# Patient Record
Sex: Male | Born: 1955 | Hispanic: Yes | Marital: Married | Smoking: Never smoker
Health system: Southern US, Community
[De-identification: ages and names within clinical notes are randomized; demographics above are authoritative.]

## PROBLEM LIST (undated history)

## (undated) DIAGNOSIS — M47816 Spondylosis without myelopathy or radiculopathy, lumbar region: Secondary | ICD-10-CM

## (undated) DIAGNOSIS — G4730 Sleep apnea, unspecified: Secondary | ICD-10-CM

## (undated) DIAGNOSIS — D499 Neoplasm of unspecified behavior of unspecified site: Secondary | ICD-10-CM

## (undated) DIAGNOSIS — Z87442 Personal history of urinary calculi: Secondary | ICD-10-CM

## (undated) DIAGNOSIS — M48061 Spinal stenosis, lumbar region without neurogenic claudication: Secondary | ICD-10-CM

## (undated) DIAGNOSIS — E669 Obesity, unspecified: Secondary | ICD-10-CM

## (undated) DIAGNOSIS — N200 Calculus of kidney: Secondary | ICD-10-CM

## (undated) DIAGNOSIS — E119 Type 2 diabetes mellitus without complications: Secondary | ICD-10-CM

## (undated) DIAGNOSIS — E1169 Type 2 diabetes mellitus with other specified complication: Secondary | ICD-10-CM

## (undated) DIAGNOSIS — I10 Essential (primary) hypertension: Secondary | ICD-10-CM

## (undated) DIAGNOSIS — J45909 Unspecified asthma, uncomplicated: Secondary | ICD-10-CM

## (undated) DIAGNOSIS — M1990 Unspecified osteoarthritis, unspecified site: Secondary | ICD-10-CM

## (undated) DIAGNOSIS — Z6841 Body Mass Index (BMI) 40.0 and over, adult: Secondary | ICD-10-CM

## (undated) HISTORY — DX: Spinal stenosis, lumbar region without neurogenic claudication: M48.061

## (undated) HISTORY — DX: Neoplasm of unspecified behavior of unspecified site: D49.9

## (undated) HISTORY — PX: LITHOTRIPSY: SUR834

## (undated) HISTORY — DX: Spondylosis without myelopathy or radiculopathy, lumbar region: M47.816

## (undated) HISTORY — PX: KNEE ARTHROSCOPY: SUR90

---

## 1998-07-24 ENCOUNTER — Encounter: Payer: Self-pay | Admitting: Emergency Medicine

## 1998-07-24 ENCOUNTER — Emergency Department (HOSPITAL_COMMUNITY): Admission: EM | Admit: 1998-07-24 | Discharge: 1998-07-24 | Payer: Self-pay | Admitting: Emergency Medicine

## 1998-07-25 ENCOUNTER — Emergency Department (HOSPITAL_COMMUNITY): Admission: EM | Admit: 1998-07-25 | Discharge: 1998-07-25 | Payer: Self-pay | Admitting: Emergency Medicine

## 1998-07-26 ENCOUNTER — Ambulatory Visit (HOSPITAL_COMMUNITY): Admission: AD | Admit: 1998-07-26 | Discharge: 1998-07-26 | Payer: Self-pay | Admitting: Urology

## 1998-07-26 ENCOUNTER — Encounter: Payer: Self-pay | Admitting: Urology

## 1998-08-01 ENCOUNTER — Ambulatory Visit (HOSPITAL_COMMUNITY): Admission: RE | Admit: 1998-08-01 | Discharge: 1998-08-01 | Payer: Self-pay | Admitting: Urology

## 1998-08-01 ENCOUNTER — Encounter: Payer: Self-pay | Admitting: Urology

## 1998-08-05 ENCOUNTER — Emergency Department (HOSPITAL_COMMUNITY): Admission: EM | Admit: 1998-08-05 | Discharge: 1998-08-06 | Payer: Self-pay | Admitting: Emergency Medicine

## 1998-08-06 ENCOUNTER — Encounter: Admission: RE | Admit: 1998-08-06 | Discharge: 1998-08-06 | Payer: Self-pay | Admitting: *Deleted

## 2001-03-01 ENCOUNTER — Encounter: Admission: RE | Admit: 2001-03-01 | Discharge: 2001-05-30 | Payer: Self-pay | Admitting: Occupational Medicine

## 2002-01-08 ENCOUNTER — Emergency Department (HOSPITAL_COMMUNITY): Admission: EM | Admit: 2002-01-08 | Discharge: 2002-01-09 | Payer: Self-pay | Admitting: *Deleted

## 2002-03-24 ENCOUNTER — Inpatient Hospital Stay (HOSPITAL_COMMUNITY): Admission: EM | Admit: 2002-03-24 | Discharge: 2002-03-28 | Payer: Self-pay | Admitting: Psychiatry

## 2002-04-16 ENCOUNTER — Inpatient Hospital Stay (HOSPITAL_COMMUNITY): Admission: EM | Admit: 2002-04-16 | Discharge: 2002-04-20 | Payer: Self-pay | Admitting: *Deleted

## 2002-04-21 ENCOUNTER — Other Ambulatory Visit: Admission: RE | Admit: 2002-04-21 | Discharge: 2002-04-25 | Payer: Self-pay | Admitting: *Deleted

## 2003-08-18 DIAGNOSIS — Z87442 Personal history of urinary calculi: Secondary | ICD-10-CM

## 2003-08-18 HISTORY — DX: Personal history of urinary calculi: Z87.442

## 2006-02-03 ENCOUNTER — Emergency Department (HOSPITAL_COMMUNITY): Admission: EM | Admit: 2006-02-03 | Discharge: 2006-02-03 | Payer: Self-pay | Admitting: Family Medicine

## 2006-10-07 ENCOUNTER — Encounter: Admission: RE | Admit: 2006-10-07 | Discharge: 2006-10-07 | Payer: Self-pay | Admitting: Occupational Medicine

## 2007-01-24 ENCOUNTER — Ambulatory Visit (HOSPITAL_BASED_OUTPATIENT_CLINIC_OR_DEPARTMENT_OTHER): Admission: RE | Admit: 2007-01-24 | Discharge: 2007-01-24 | Payer: Self-pay | Admitting: Physician Assistant

## 2007-01-29 ENCOUNTER — Ambulatory Visit: Payer: Self-pay | Admitting: Internal Medicine

## 2010-12-30 NOTE — Procedures (Signed)
NAME:  Jeffery Higgins, Jeffery Higgins               ACCOUNT NO.:  [id number]   MEDICAL RECORD NO.:  [id number]          PATIENT TYPE:  OUT   LOCATION:  SLEEP CENTER                 FACILITY:  [facility]   PHYSICIAN:  Clinton D. Maple Hudson, MD, FCCP, FACPDATE OF BIRTH:  16-Jun-1956   DATE OF STUDY:  01/24/2007                            NOCTURNAL POLYSOMNOGRAM   REFERRING PHYSICIAN:  Lonie Peak, P.A.-C.   INDICATION FOR STUDY:  Hypersomnia with sleep apnea.   EPWORTH SLEEPINESS SCORE:  16/24, BMI 36.7, weight 263 pounds.   MEDICATIONS:  No medications listed.   SLEEP ARCHITECTURE:  Total sleep time 315 minutes and sleep efficiency  82%.  Stage I was 7%, stage II 71%, stages III and IV 3%, REM 19% of  total sleep time.  Sleep latency 22 minutes, REM latency 137 minutes,  awake after sleep onset 48 minutes, arousal index 21.5.  No bedtime  medication was taken.   RESPIRATORY DATA:  Split study protocol.  Apnea/hypopnea index (AHI,  RDI) 19.2 obstructive events per hour indicating moderate obstructive  sleep apnea/hypopnea syndrome before CPAP.  There were 15 obstructive  apneas and 26 hypopneas before CPAP.  Events were not positional.  REM  AHI 6.2.  CPAP was titrated to 10 CWP, AHI zero per hour.  A large  Mirage Quattro mask was used with heated humidifier.   OXYGEN DATA:  Moderately loud snoring with oxygen desaturation to a  nadir of 82% before CPAP.  After CPAP control, saturation held 95% on  room air.   CARDIAC DATA:  Sinus rhythm with occasional PVCs.   MOVEMENT-PARASOMNIA:  No significant movement disturbance. Bathroom x1.   IMPRESSIONS-RECOMMENDATIONS:  1. Moderate obstructive sleep apnea/hypopnea syndrome, AHI 19.2 per      hour with nonpositional events, moderately loud snoring, and oxygen      desaturation to a nadir of 82%.  2. Successful CPAP titration to 10 CWP, AHI zero per hour.  A large      Mirage Quattro full-face mask was used with heated humidifier.      Clinton D. Maple Hudson,  MD, FCCP, FACP  Diplomate, Biomedical engineer of Sleep Medicine  Electronically Signed     CDY/MEDQ  D:  01/30/2007 11:13:31  T:  01/30/2007 19:43:21  Job:  [id number]
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
## 2011-01-02 NOTE — Discharge Summary (Signed)
NAME:  Jeffery Higgins, ARAKAWA NO.:  [id number]   MEDICAL RECORD NO.:  [id number]                   PATIENT TYPE:  IPS   LOCATION:  0301                                 [facility]ACILITY:  [facility]   PHYSICIAN:  Jeanice Lim, MD                DATE OF BIRTH:  1956-04-28   DATE OF ADMISSION:  03/24/2002  DATE OF DISCHARGE:  03/28/2002                                 DISCHARGE SUMMARY   IDENTIFYING DATA:  A 55 year old Latino male with a history of severe  depression, with difficulty concentrating, hopelessness, worthlessness,  panic attacks and suicidal ideation, ruminating about dying, feeling that he  could not take it anymore.   ADMISSION MEDICATIONS:  Trazodone prescribed by Dr. Ilsa Iha.  Not on  antidepressant.   ALLERGIES:  ASPIRIN.   PHYSICAL EXAMINATION:  Essentially within normal limits, neurologically  nonfocal.   ROUTINE ADMISSION LABS:  Essentially within normal limits.   MENTAL STATUS EXAM:  Friendly, cooperative, good eye contact, psychomotor  retardation.  Speech soft and slow, mood depressed and anxious.  Thought  process goal directed.  Thought content positive for fleeting suicidal  ideation, no psychotic symptoms, cognitively intact, judgment and insight  fair.   ADMISSION DIAGNOSES:   AXIS I:  1. Major depressive disorder, recurrent, severe.  2. Panic disorder without agoraphobia.   AXIS II:  None.   AXIS III:  None.   AXIS IV:  Severe, problems with primary support group, separation from his  wife.   AXIS V:  20/60.   HOSPITAL COURSE:  The patient was admitted and ordered routine p.r.n.  medications, underwent further monitoring, was given Ativan p.r.n. anxiety  and titrated on Seroquel.  Family meeting was held.  Daughter's concerns  regarding patient's severity of depression were addressed and the patient  felt that he was less depressed and able to cope with stress outside the  hospital in a healthy manner.   CONDITION ON  DISCHARGE:  Improved.  Mood was more euthymic, affect brighter,  thought process goal directed.  Thought content negative for dangerous  ideation or psychotic symptoms.  The patient reported no panic attacks nor  dangerous ideations and he reported motivation to be compliant with followup  plan.   DISCHARGE MEDICATIONS:  1. Lexapro 10 mg q.a.m.  2. Ativan 0.5 mg q.8 p.r.n. anxiety.  3. Seroquel 100 mg 1-1/2 q.h.s.  4. Ambien 10 mg 1 q.h.s. p.r.n. insomnia.   DISPOSITION:  The patient was to follow up with Dr. Ilsa Iha on August 18 at 1  p.m.   DISCHARGE DIAGNOSES:   AXIS I:  1. Major depressive disorder, recurrent, severe.  2. Panic disorder without agoraphobia.   AXIS II:  None.   AXIS III:  None.   AXIS IV:  Severe, problems with primary support group, separation from his  wife.   AXIS V:  Global assessment of function on discharge was 55.  Jeanice Lim, MD    JEM/MEDQ  D:  04/27/2002  T:  04/28/2002  Job:  [id number]

## 2011-01-02 NOTE — Discharge Summary (Signed)
NAME:  Jeffery Higgins, Jeffery Higgins NO.:  [id number]   MEDICAL RECORD NO.:  [id number]                   PATIENT TYPE:  IPS   LOCATION:  0301                                 [facility]ACILITY[facility]  [facility]   PHYSICIAN:  Jeanice Lim, M.D.              DATE OF BIRTH:  Aug 25, 1955   DATE OF ADMISSION:  03/24/2002  DATE OF DISCHARGE:  03/28/2002                                 DISCHARGE SUMMARY   IDENTIFYING DATA:  This is a 55 year old separated Hispanic male, presenting  with a history of intentional overdose, taking Ambien and possibly some  Seroquel.   ADMISSION MEDICATIONS:  Ambien 10 mg, Lexapro switched to Celexa recently  and then switched again back to Lexapro.  The patient reported being  agitated, was unclear of doses.   ALLERGIES:  ASPIRIN.   PHYSICAL EXAMINATION:  Essentially within normal limits, neurologically  nonfocal.   ROUTINE ADMISSION LABS:  Essentially within normal limits.  CT was negative,  a urine drug screen negative, CBC within normal limits, albumin slightly low  at 3.1.  Salicylates and acetaminophen level were within safe ranges.   MENTAL STATUS EXAM:  Alert, middle-aged male, cooperative, casually dressed,  speech clear and relevant, mood depressed, affect flat, thought process goal  directed, thought content negative for dangerous ideations or psychotic  symptoms.  The patient reported on suicidal or homicidal ideation at the  time of evaluation.  Cognition intact.  Judgment and insight were fair.   ADMISSION DIAGNOSES:   AXIS I:  Major depressive disorder, recurrent, severe.   AXIS II:  None.   AXIS III:  None.   AXIS IV:  Moderate, problems with primary support group.   AXIS V:  30/70.   HOSPITAL COURSE:  The patient was admitted and ordered routine p.r.n.  medications, underwent further monitoring, and was encouraged to participate  in individual, group and milieu therapy.  Was concerned about being the  hospital and not being at  work, feeling that he only had 1 more day to be  able to take off work, reported being desperate at the time of the overdose,  taking Ambien and trazodone and now feels that the overdose was crazy,  regretting the overdose and reporting ability to cope with outside stresses.  The patient was monitored for an adequate time period, reported no recurring  suicidal or homicidal ideation.  The patient also reported motivation to be  willing to follow up with the Intensive Outpatient program and reported a  positive response to clinical intervention regarding crisis control   CONDITION ON DISCHARGE:  Improved.  Mood was less depressed, affect  brighter, thought process goal directed.  Thought content negative for  dangerous ideation or psychotic symptoms.  The patient specifically denied  suicidal ideation and reported willingness to follow up with Intensive  Outpatient Program and being compliant with medications despite mild side  effects that he may have  had in the past.   DISCHARGE MEDICATIONS:  1. Ativan 1 mg t.i.d.  2. Effexor XR 37.5 mg 2 q.a.m. and 1 q.3 p.m.  3. Seroquel 100 mg q.h.s.  4. Ambien 10 mg q.h.s. p.r.n. insomnia.   DISPOSITION:  The patient was to follow up with the Intensive Outpatient  Program on September 5 at 8:30 and then Valente David on September 8 at 10  a.m.   DISCHARGE DIAGNOSES:   AXIS I:  Major depressive disorder, recurrent, severe.   AXIS II:  None.   AXIS III:  None.   AXIS IV:  Moderate, problems with primary support group.   AXIS V:  Global assessment of function on discharge was 55.                                                 Jeanice Lim, M.D.    JEM/MEDQ  D:  06/28/2002  T:  06/29/2002  Job:  [id number]
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
## 2012-01-25 ENCOUNTER — Other Ambulatory Visit: Payer: Self-pay | Admitting: Occupational Medicine

## 2012-01-25 ENCOUNTER — Ambulatory Visit: Payer: Self-pay

## 2012-01-25 DIAGNOSIS — R52 Pain, unspecified: Secondary | ICD-10-CM

## 2012-06-24 ENCOUNTER — Encounter (HOSPITAL_COMMUNITY): Payer: Self-pay | Admitting: Pharmacy Technician

## 2012-06-30 ENCOUNTER — Encounter (HOSPITAL_COMMUNITY)
Admission: RE | Admit: 2012-06-30 | Discharge: 2012-06-30 | Disposition: A | Discharge status: Home / Self Care | Payer: Worker's Compensation | Source: Ambulatory Visit | Attending: Orthopedic Surgery | Admitting: Orthopedic Surgery

## 2012-06-30 ENCOUNTER — Encounter (HOSPITAL_COMMUNITY)
Admission: RE | Admit: 2012-06-30 | Discharge: 2012-06-30 | Disposition: A | Discharge status: Home / Self Care | Payer: Worker's Compensation | Source: Ambulatory Visit | Attending: Anesthesiology | Admitting: Anesthesiology

## 2012-06-30 ENCOUNTER — Encounter (HOSPITAL_COMMUNITY): Payer: Self-pay

## 2012-06-30 HISTORY — DX: Unspecified osteoarthritis, unspecified site: M19.90

## 2012-06-30 HISTORY — DX: Personal history of urinary calculi: Z87.442

## 2012-06-30 HISTORY — DX: Essential (primary) hypertension: I10

## 2012-06-30 HISTORY — DX: Sleep apnea, unspecified: G47.30

## 2012-06-30 HISTORY — DX: Type 2 diabetes mellitus without complications: E11.9

## 2012-06-30 LAB — CBC
HCT: 47.5 % (ref 39.0–52.0)
Hemoglobin: 15.7 g/dL (ref 13.0–17.0)
MCHC: 33.1 g/dL (ref 30.0–36.0)
MCV: 88 fL (ref 78.0–100.0)
RDW: 14.3 % (ref 11.5–15.5)
WBC: 7.4 10*3/uL (ref 4.0–10.5)

## 2012-06-30 LAB — BASIC METABOLIC PANEL
Creatinine, Ser: 0.84 mg/dL (ref 0.50–1.35)
GFR calc non Af Amer: >90 mL/min (ref >90)
Glucose, Bld: 159 mg/dL — ABNORMAL HIGH (ref 70–99)
Potassium: 4.2 mEq/L (ref 3.5–5.1)
Sodium: 140 mEq/L (ref 135–145)

## 2012-06-30 LAB — SURGICAL PCR SCREEN: Staphylococcus aureus: NEGATIVE

## 2012-06-30 LAB — NO BLOOD PRODUCTS

## 2012-06-30 NOTE — Progress Notes (Addendum)
Pt has been dx with sleep apnea, last sleep study 2008, in chart. Pt cannot tolerate and does not use CPAP.   Pt's EKG "cannot rule out anterior infarct". Will leave for anesthesia review. Pt denies ever having prior EKG done, none available for comparison. Pt denied hx of cardiac problems or cardiac testing.

## 2012-06-30 NOTE — Pre-Procedure Instructions (Signed)
20 Ruble Pumphrey  06/30/2012   Your procedure is scheduled on:  Thursday November 21  Report to [facility] Stay [facility] at 5:30 AM.  Call this number if you have problems the morning of surgery: [phone]   Remember:   Do not eat or drink:After Midnight.    Take these medicines the morning of surgery with A SIP OF WATER: Amlodipine (Norvasc)   Do not wear jewelry, make-up or nail polish.  Do not wear lotions, powders, or perfumes. You may wear deodorant.  Do not shave 48 hours prior to surgery. Men may shave face and neck.  Do not bring valuables to the hospital.  Contacts, dentures or bridgework may not be worn into surgery.  Leave suitcase in the car. After surgery it may be brought to your room.  For patients admitted to the hospital, checkout time is 11:00 AM the day of discharge.   Patients discharged the day of surgery will not be allowed to drive home.    Special Instructions: Shower using CHG 2 nights before surgery and the night before surgery.  If you shower the day of surgery use CHG.  Use special wash - you have one bottle of CHG for all showers.  You should use approximately 1/3 of the bottle for each shower.   Please read over the following fact sheets that you were given: Incentive Spirometry, Pain Booklet, Coughing and Deep Breathing and Surgical Site Infection Prevention

## 2012-07-01 NOTE — Consult Note (Signed)
Anesthesia chart review: Patient is a 56 year old male scheduled for right shoulder arthroscopy with subacromial decompression distal clavicle resection for right shoulder impingement by Dr. Rennis Chris on 07/07/12.  History includes nonsmoker, morbid obesity, hypertension, diabetes mellitus type 2, obstructive sleep apnea without CPAP use, kidney stones, arthritis.  Labs noted.  Glucose is 159.  He has refused all blood products.  Chest x-ray on 06/30/2012 showed no active disease.  EKG on 06/30/2012 showed normal sinus rhythm, minimal voltage criteria for LVH, left axis deviation. His left axis deviation was new since 01/09/02, but otherwise not felt significantly changed.  If no acute cardiopulmonary symptoms then anticipate he can proceed as planned.  Shonna Chock, PA-C

## 2012-07-06 MED ORDER — DEXTROSE 5 % IV SOLN
3.0000 g | INTRAVENOUS | Status: DC
  Filled 2012-07-06: qty 3000

## 2012-07-07 ENCOUNTER — Encounter (HOSPITAL_COMMUNITY): Payer: Self-pay | Admitting: Vascular Surgery

## 2012-07-07 ENCOUNTER — Ambulatory Visit (HOSPITAL_COMMUNITY): Payer: Worker's Compensation | Admitting: Vascular Surgery

## 2012-07-07 ENCOUNTER — Encounter (HOSPITAL_COMMUNITY)
Admission: RE | Disposition: A | Discharge status: Home / Self Care | Payer: Self-pay | Source: Ambulatory Visit | Attending: Orthopedic Surgery

## 2012-07-07 ENCOUNTER — Ambulatory Visit (HOSPITAL_COMMUNITY)
Admission: RE | Admit: 2012-07-07 | Discharge: 2012-07-07 | Disposition: A | Discharge status: Home / Self Care | Payer: Worker's Compensation | Source: Ambulatory Visit | Attending: Orthopedic Surgery | Admitting: Orthopedic Surgery

## 2012-07-07 DIAGNOSIS — G4730 Sleep apnea, unspecified: Secondary | ICD-10-CM | POA: Insufficient documentation

## 2012-07-07 DIAGNOSIS — E119 Type 2 diabetes mellitus without complications: Secondary | ICD-10-CM | POA: Insufficient documentation

## 2012-07-07 DIAGNOSIS — Z79899 Other long term (current) drug therapy: Secondary | ICD-10-CM | POA: Insufficient documentation

## 2012-07-07 DIAGNOSIS — M24119 Other articular cartilage disorders, unspecified shoulder: Secondary | ICD-10-CM | POA: Insufficient documentation

## 2012-07-07 DIAGNOSIS — I10 Essential (primary) hypertension: Secondary | ICD-10-CM | POA: Insufficient documentation

## 2012-07-07 DIAGNOSIS — M25819 Other specified joint disorders, unspecified shoulder: Secondary | ICD-10-CM | POA: Insufficient documentation

## 2012-07-07 DIAGNOSIS — Z01818 Encounter for other preprocedural examination: Secondary | ICD-10-CM | POA: Insufficient documentation

## 2012-07-07 DIAGNOSIS — M129 Arthropathy, unspecified: Secondary | ICD-10-CM | POA: Insufficient documentation

## 2012-07-07 DIAGNOSIS — M19019 Primary osteoarthritis, unspecified shoulder: Secondary | ICD-10-CM | POA: Insufficient documentation

## 2012-07-07 DIAGNOSIS — Z87442 Personal history of urinary calculi: Secondary | ICD-10-CM | POA: Insufficient documentation

## 2012-07-07 DIAGNOSIS — Z01812 Encounter for preprocedural laboratory examination: Secondary | ICD-10-CM | POA: Insufficient documentation

## 2012-07-07 HISTORY — PX: SHOULDER ARTHROSCOPY WITH SUBACROMIAL DECOMPRESSION: SHX5684

## 2012-07-07 SURGERY — SHOULDER ARTHROSCOPY WITH SUBACROMIAL DECOMPRESSION
Anesthesia: General | Site: Shoulder | Laterality: Right | Wound class: Clean

## 2012-07-07 MED ORDER — FENTANYL CITRATE 0.05 MG/ML IJ SOLN
INTRAMUSCULAR | Status: DC | PRN
  Administered 2012-07-07 (×5): 50 ug via INTRAVENOUS
  Administered 2012-07-07: 100 ug via INTRAVENOUS
  Administered 2012-07-07: 50 ug via INTRAVENOUS

## 2012-07-07 MED ORDER — HYDROMORPHONE HCL PF 1 MG/ML IJ SOLN
INTRAMUSCULAR | Status: AC
  Filled 2012-07-07: qty 1

## 2012-07-07 MED ORDER — BUPIVACAINE HCL 0.25 % IJ SOLN
INTRAMUSCULAR | Status: DC | PRN
  Administered 2012-07-07: 60 mL

## 2012-07-07 MED ORDER — OXYCODONE HCL 5 MG/5ML PO SOLN: 5.0000 mg | Freq: Once | ORAL | Status: DC | PRN

## 2012-07-07 MED ORDER — OXYCODONE HCL 5 MG PO TABS: 5.0000 mg | ORAL_TABLET | Freq: Once | ORAL | Status: DC | PRN

## 2012-07-07 MED ORDER — GLYCOPYRROLATE 0.2 MG/ML IJ SOLN
INTRAMUSCULAR | Status: DC | PRN
  Administered 2012-07-07: .9 mg via INTRAVENOUS

## 2012-07-07 MED ORDER — SODIUM CHLORIDE 0.9 % IR SOLN
Status: DC | PRN
  Administered 2012-07-07: 6000 mL

## 2012-07-07 MED ORDER — OXYCODONE-ACETAMINOPHEN 5-325 MG PO TABS: 1.0000 | ORAL_TABLET | ORAL | Status: DC | PRN

## 2012-07-07 MED ORDER — METOCLOPRAMIDE HCL 5 MG/ML IJ SOLN
10.0000 mg | Freq: Once | INTRAMUSCULAR | Status: AC | PRN
  Administered 2012-07-07: 10 mg via INTRAVENOUS

## 2012-07-07 MED ORDER — TEMAZEPAM 30 MG PO CAPS: 30.0000 mg | ORAL_CAPSULE | Freq: Every evening | ORAL | Status: DC | PRN

## 2012-07-07 MED ORDER — CHLORHEXIDINE GLUCONATE 4 % EX LIQD: 60.0000 mL | Freq: Once | CUTANEOUS | Status: DC

## 2012-07-07 MED ORDER — METOCLOPRAMIDE HCL 5 MG/ML IJ SOLN
INTRAMUSCULAR | Status: DC | PRN
  Administered 2012-07-07: 10 mg via INTRAVENOUS

## 2012-07-07 MED ORDER — LIDOCAINE HCL (CARDIAC) 20 MG/ML IV SOLN
INTRAVENOUS | Status: DC | PRN
  Administered 2012-07-07: 100 mg via INTRAVENOUS

## 2012-07-07 MED ORDER — ONDANSETRON HCL 4 MG/2ML IJ SOLN
INTRAMUSCULAR | Status: DC | PRN
  Administered 2012-07-07: 4 mg via INTRAVENOUS

## 2012-07-07 MED ORDER — HYDROMORPHONE HCL PF 1 MG/ML IJ SOLN
0.2500 mg | INTRAMUSCULAR | Status: DC | PRN
  Administered 2012-07-07 (×2): 0.5 mg via INTRAVENOUS

## 2012-07-07 MED ORDER — LACTATED RINGERS IV SOLN
INTRAVENOUS | Status: DC | PRN
  Administered 2012-07-07 (×2): via INTRAVENOUS

## 2012-07-07 MED ORDER — NEOSTIGMINE METHYLSULFATE 1 MG/ML IJ SOLN
INTRAMUSCULAR | Status: DC | PRN
  Administered 2012-07-07: 5 mg via INTRAVENOUS

## 2012-07-07 MED ORDER — ROCURONIUM BROMIDE 100 MG/10ML IV SOLN
INTRAVENOUS | Status: DC | PRN
  Administered 2012-07-07: 50 mg via INTRAVENOUS

## 2012-07-07 MED ORDER — DEXAMETHASONE SODIUM PHOSPHATE 4 MG/ML IJ SOLN
INTRAMUSCULAR | Status: DC | PRN
  Administered 2012-07-07: 4 mg via INTRAVENOUS

## 2012-07-07 MED ORDER — MIDAZOLAM HCL 5 MG/5ML IJ SOLN
INTRAMUSCULAR | Status: DC | PRN
  Administered 2012-07-07: 2 mg via INTRAVENOUS

## 2012-07-07 MED ORDER — OXYCODONE-ACETAMINOPHEN 5-325 MG PO TABS
ORAL_TABLET | ORAL | Status: AC
  Filled 2012-07-07: qty 2

## 2012-07-07 MED ORDER — LACTATED RINGERS IV SOLN: INTRAVENOUS | Status: DC

## 2012-07-07 MED ORDER — BUPIVACAINE HCL (PF) 0.25 % IJ SOLN
INTRAMUSCULAR | Status: AC
  Filled 2012-07-07: qty 60

## 2012-07-07 MED ORDER — OXYCODONE-ACETAMINOPHEN 5-325 MG PO TABS
2.0000 | ORAL_TABLET | ORAL | Status: AC | PRN
  Administered 2012-07-07: 2 via ORAL

## 2012-07-07 MED ORDER — METOCLOPRAMIDE HCL 5 MG/ML IJ SOLN
INTRAMUSCULAR | Status: AC
  Administered 2012-07-07: 10 mg via INTRAVENOUS
  Filled 2012-07-07: qty 2

## 2012-07-07 MED ORDER — CYCLOBENZAPRINE HCL 10 MG PO TABS: 10.0000 mg | ORAL_TABLET | Freq: Three times a day (TID) | ORAL | Status: DC | PRN

## 2012-07-07 MED ORDER — PROPOFOL 10 MG/ML IV BOLUS
INTRAVENOUS | Status: DC | PRN
  Administered 2012-07-07: 200 mg via INTRAVENOUS

## 2012-07-07 MED ORDER — LABETALOL HCL 5 MG/ML IV SOLN
INTRAVENOUS | Status: DC | PRN
  Administered 2012-07-07 (×2): 5 mg via INTRAVENOUS

## 2012-07-07 SURGICAL SUPPLY — 61 items
BLADE CUTTER GATOR 3.5 (BLADE) ×2 IMPLANT
BLADE GREAT WHITE 4.2 (BLADE) ×2 IMPLANT
BLADE SURG 11 STRL SS (BLADE) ×2 IMPLANT
BOOTCOVER CLEANROOM LRG (PROTECTIVE WEAR) ×4 IMPLANT
BUR 3.5 LG SPHERICAL (BURR) IMPLANT
BUR OVAL 4.0 (BURR) ×2 IMPLANT
BURR 3.5 LG SPHERICAL (BURR)
CANISTER SUCT LVC 12 LTR MEDI- (MISCELLANEOUS) ×2 IMPLANT
CANNULA ACUFLEX KIT 5X76 (CANNULA) ×2 IMPLANT
CANNULA DRILOCK 5.0X75 (CANNULA) IMPLANT
CLOTH BEACON ORANGE TIMEOUT ST (SAFETY) ×2 IMPLANT
CLSR STERI-STRIP ANTIMIC 1/2X4 (GAUZE/BANDAGES/DRESSINGS) ×2 IMPLANT
CONNECTOR 5 IN 1 STRAIGHT STRL (MISCELLANEOUS) ×2 IMPLANT
DRAPE INCISE 23X17 IOBAN STRL (DRAPES) ×1
DRAPE INCISE IOBAN 23X17 STRL (DRAPES) ×1 IMPLANT
DRAPE INCISE IOBAN 66X45 STRL (DRAPES) ×2 IMPLANT
DRAPE STERI 35X30 U-POUCH (DRAPES) ×2 IMPLANT
DRAPE SURG 17X11 SM STRL (DRAPES) ×2 IMPLANT
DRAPE U-SHAPE 47X51 STRL (DRAPES) ×2 IMPLANT
DRSG PAD ABDOMINAL 8X10 ST (GAUZE/BANDAGES/DRESSINGS) ×4 IMPLANT
DURAPREP 26ML APPLICATOR (WOUND CARE) ×4 IMPLANT
ELECT REM PT RETURN 9FT ADLT (ELECTROSURGICAL)
ELECTRODE REM PT RTRN 9FT ADLT (ELECTROSURGICAL) IMPLANT
GLOVE BIO SURGEON STRL SZ7.5 (GLOVE) ×2 IMPLANT
GLOVE BIO SURGEON STRL SZ8 (GLOVE) ×2 IMPLANT
GLOVE BIO SURGEON STRL SZ8.5 (GLOVE) ×4 IMPLANT
GLOVE EUDERMIC 7 POWDERFREE (GLOVE) ×2 IMPLANT
GLOVE SS BIOGEL STRL SZ 7.5 (GLOVE) ×1 IMPLANT
GLOVE SUPERSENSE BIOGEL SZ 7.5 (GLOVE) ×1
GLOVE SURG SS PI 7.5 STRL IVOR (GLOVE) ×2 IMPLANT
GOWN PREVENTION PLUS XXLARGE (GOWN DISPOSABLE) ×4 IMPLANT
GOWN STRL NON-REIN LRG LVL3 (GOWN DISPOSABLE) ×2 IMPLANT
GOWN STRL REIN XL XLG (GOWN DISPOSABLE) ×4 IMPLANT
KIT BASIN OR (CUSTOM PROCEDURE TRAY) ×2 IMPLANT
KIT ROOM TURNOVER OR (KITS) ×2 IMPLANT
KIT SHOULDER TRACTION (DRAPES) ×2 IMPLANT
MANIFOLD NEPTUNE II (INSTRUMENTS) ×2 IMPLANT
NDL SUT 6 .5 CRC .975X.05 MAYO (NEEDLE) IMPLANT
NEEDLE 22X1 1/2 (OR ONLY) (NEEDLE) ×2 IMPLANT
NEEDLE MAYO TAPER (NEEDLE)
NEEDLE SPNL 18GX3.5 QUINCKE PK (NEEDLE) ×2 IMPLANT
NS IRRIG 1000ML POUR BTL (IV SOLUTION) ×2 IMPLANT
PACK SHOULDER (CUSTOM PROCEDURE TRAY) ×2 IMPLANT
PAD ARMBOARD 7.5X6 YLW CONV (MISCELLANEOUS) ×4 IMPLANT
SET ARTHROSCOPY TUBING (MISCELLANEOUS) ×1
SET ARTHROSCOPY TUBING LN (MISCELLANEOUS) ×1 IMPLANT
SLING ARM FOAM STRAP LRG (SOFTGOODS) IMPLANT
SLING ARM FOAM STRAP MED (SOFTGOODS) IMPLANT
SLING ARM FOAM STRAP XLG (SOFTGOODS) ×2 IMPLANT
SPONGE GAUZE 4X4 12PLY (GAUZE/BANDAGES/DRESSINGS) ×2 IMPLANT
SPONGE LAP 4X18 X RAY DECT (DISPOSABLE) ×2 IMPLANT
STRIP CLOSURE SKIN 1/2X4 (GAUZE/BANDAGES/DRESSINGS) IMPLANT
SUT MNCRL AB 3-0 PS2 18 (SUTURE) ×2 IMPLANT
SUT PDS AB 0 CT 36 (SUTURE) IMPLANT
SUT RETRIEVER GRASP 30 DEG (SUTURE) IMPLANT
SYR 20CC LL (SYRINGE) ×2 IMPLANT
TAPE PAPER 3X10 WHT MICROPORE (GAUZE/BANDAGES/DRESSINGS) ×2 IMPLANT
TOWEL OR 17X24 6PK STRL BLUE (TOWEL DISPOSABLE) ×2 IMPLANT
TOWEL OR 17X26 10 PK STRL BLUE (TOWEL DISPOSABLE) ×2 IMPLANT
WAND SUCTION MAX 4MM 90S (SURGICAL WAND) ×2 IMPLANT
WATER STERILE IRR 1000ML POUR (IV SOLUTION) ×2 IMPLANT

## 2012-07-07 NOTE — Anesthesia Preprocedure Evaluation (Addendum)
Anesthesia Evaluation  Patient identified by MRN, date of birth, ID band Patient awake    Reviewed: Allergy & Precautions, H&P , NPO status , Patient's Chart, lab work & pertinent test results, reviewed documented beta blocker date and time   Airway Mallampati: II TM Distance: >3 FB Neck ROM: full    Dental  (+) Dental Advisory Given   Pulmonary sleep apnea ,  breath sounds clear to auscultation        Cardiovascular hypertension, On Medications negative cardio ROS  Rhythm:regular     Neuro/Psych negative neurological ROS  negative psych ROS   GI/Hepatic negative GI ROS, Neg liver ROS,   Endo/Other  diabetes, Well Controlled, Type 2, Oral Hypoglycemic AgentsMorbid obesity  Renal/GU negative Renal ROS  negative genitourinary   Musculoskeletal   Abdominal   Peds  Hematology negative hematology ROS (+)   Anesthesia Other Findings See surgeon's H&P   Reproductive/Obstetrics negative OB ROS                          Anesthesia Physical Anesthesia Plan  ASA: III  Anesthesia Plan: General   Post-op Pain Management:    Induction: Intravenous  Airway Management Planned: Oral ETT  Additional Equipment:   Intra-op Plan:   Post-operative Plan: Extubation in OR  Informed Consent: I have reviewed the patients History and Physical, chart, labs and discussed the procedure including the risks, benefits and alternatives for the proposed anesthesia with the patient or authorized representative who has indicated his/her understanding and acceptance.   Dental Advisory Given  Plan Discussed with: CRNA, Surgeon and Anesthesiologist  Anesthesia Plan Comments:        Anesthesia Quick Evaluation

## 2012-07-07 NOTE — Op Note (Signed)
07/07/2012  9:30 AM  PATIENT:   Jeffery Higgins  56 y.o. male  PRE-OPERATIVE DIAGNOSIS:  RIGHT SHOULDER IMPINGEMENT, AC joint OA  POST-OPERATIVE DIAGNOSIS:  Same with labral tear  PROCEDURE: RSA, labral debridement, SAD, DCR  SURGEON:  Trip Cavanagh, Vania Rea M.D.  ASSISTANTS: Shuford pac   ANESTHESIA:   GET + ISB  EBL: min  SPECIMEN:  none  Drains: none   PATIENT DISPOSITION:  PACU - hemodynamically stable.    PLAN OF CARE: Discharge to home after PACU  Dictation# [id number]

## 2012-07-07 NOTE — Transfer of Care (Signed)
Immediate Anesthesia Transfer of Care Note  Patient: Jeffery Higgins  Procedure(s) Performed: Procedure(s) (LRB) with comments: SHOULDER ARTHROSCOPY WITH SUBACROMIAL DECOMPRESSION (Right) - with distal clavicle resection  Patient Location: PACU  Anesthesia Type:General  Level of Consciousness: awake, alert , oriented and patient cooperative  Airway & Oxygen Therapy: Patient Spontanous Breathing and Patient connected to nasal cannula oxygen  Post-op Assessment: Report given to PACU RN, Post -op Vital signs reviewed and stable and Patient moving all extremities X 4  Post vital signs: Reviewed and stable  Complications: No apparent anesthesia complications

## 2012-07-07 NOTE — Anesthesia Postprocedure Evaluation (Signed)
Anesthesia Post Note  Patient: Jeffery Higgins  Procedure(s) Performed: Procedure(s) (LRB): SHOULDER ARTHROSCOPY WITH SUBACROMIAL DECOMPRESSION (Right)  Anesthesia type: General  Patient location: PACU  Post pain: Pain level controlled  Post assessment: Patient's Cardiovascular Status Stable  Last Vitals:  Filed Vitals:   07/07/12 1100  BP: 151/85  Pulse: 80  Temp: 36.2 C  Resp: 23    Post vital signs: Reviewed and stable  Level of consciousness: alert  Complications: No apparent anesthesia complications

## 2012-07-07 NOTE — Anesthesia Procedure Notes (Signed)
Procedure Name: Intubation Date/Time: 07/07/2012 7:55 AM Performed by: Rogelia Boga Pre-anesthesia Checklist: Patient identified, Emergency Drugs available, Suction available, Patient being monitored and Timeout performed Patient Re-evaluated:Patient Re-evaluated prior to inductionOxygen Delivery Method: Circle system utilized Preoxygenation: Pre-oxygenation with 100% oxygen Intubation Type: IV induction Ventilation: Mask ventilation without difficulty and Oral airway inserted - appropriate to patient size Laryngoscope Size: Mac and 4 Grade View: Grade II Tube type: Oral Tube size: 7.5 mm Number of attempts: 1 Airway Equipment and Method: Stylet Placement Confirmation: ETT inserted through vocal cords under direct vision,  positive ETCO2 and breath sounds checked- equal and bilateral Secured at: 23 cm Tube secured with: Tape Dental Injury: Teeth and Oropharynx as per pre-operative assessment

## 2012-07-07 NOTE — Preoperative (Signed)
Beta Blockers   Reason not to administer Beta Blockers:Not Applicable 

## 2012-07-07 NOTE — H&P (Signed)
Jeffery Higgins    Chief Complaint: RIGHT SHOULDER IMPINGEMENT  HPI: The patient is a 56 y.o. male with chronic right shoulder impingement  Past Medical History  Diagnosis Date  . Hypertension   . Sleep apnea     not using CPAP - could not tolerate  . Diabetes mellitus without complication   . History of kidney stones 2005  . Arthritis     Past Surgical History  Procedure Date  . Lithotripsy     No family history on file.  Social History:  reports that he has never smoked. He does not have any smokeless tobacco history on file. He reports that he drinks alcohol. He reports that he does not use illicit drugs.  Allergies:  Allergies  Allergen Reactions  . Aspirin Swelling    Medications Prior to Admission  Medication Sig Dispense Refill  . amLODipine (NORVASC) 5 MG tablet Take 5 mg by mouth daily.      [name] ibuprofen (ADVIL,MOTRIN) 200 MG tablet Take 200 mg by mouth every 6 (six) hours as needed. For pain      . losartan (COZAAR) 50 MG tablet Take 50 mg by mouth daily.      . metFORMIN (GLUCOPHAGE) 500 MG tablet Take 500 mg by mouth daily.         Physical Exam: right shoulder with painful ROM as noted at most recent office.  Vitals  Temp:  [97.9 F (36.6 C)] 97.9 F (36.6 C) (11/21 0619) Pulse Rate:  [20] 20  (11/21 0619) Resp:  [16] 16  (11/21 0619) BP: (150)/(109) 150/109 mmHg (11/21 0619) SpO2:  [97 %] 97 % (11/21 0619)  Assessment/Plan  Impression: RIGHT SHOULDER IMPINGEMENT   Plan of Action: Procedure(s): SHOULDER ARTHROSCOPY WITH SUBACROMIAL DECOMPRESSION SHOULDER ARTHROSCOPY WITH DISTAL CLAVICLE RESECTION  Jeffery Higgins M 07/07/2012, 7:38 AM

## 2012-07-08 ENCOUNTER — Encounter (HOSPITAL_COMMUNITY): Payer: Self-pay | Admitting: Orthopedic Surgery

## 2012-07-08 NOTE — Op Note (Signed)
Jeffery Higgins, Jeffery Higgins               ACCOUNT NO.:  [id number]  MEDICAL RECORD NO.:  [id number]  LOCATION:  [facility]                         FACILITY:  [facility]  PHYSICIAN:  Vania Rea. Maesyn Frisinger, M.D.  DATE OF BIRTH:  1956/01/22  DATE OF PROCEDURE:  07/07/2012 DATE OF DISCHARGE:  07/07/2012                              OPERATIVE REPORT   PREOPERATIVE DIAGNOSES: 1. Chronic right shoulder pain with impingement syndrome 2. 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  POSTOPERATIVE DIAGNOSIS: 1. Chronic right shoulder pain with impingement syndrome. 2. 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 3. Right shoulder complex and extensive labral tear.  PROCEDURE: 1. Right shoulder examination under anesthesia. 2. Right shoulder diagnostic arthroscopy. 3. Debridement of complex and extensive labral tear. 4. Arthroscopic subacromial decompression and bursectomy. 5. Arthroscopic distal clavicle resection.  SURGEON:  Vania Rea. Shondra Capps, MD  ASSISTANT:  Lucita Lora. Shuford, PA-C  ANESTHESIA:  General endotracheal as well as an interscalene block.  ESTIMATED BLOOD LOSS:  Minimal.  DRAINS:  None.  HISTORY:  Jeffery Higgins was a 56 year old gentleman who has had chronic right shoulder pain with an impingement syndrome and symptomatic AC joint arthrosis which has been refractory to prolonged attempts at conservative management.  Plain radiographs confirming AC joint arthrosis with an MRI scan showing tendinosis of the rotator cuff but no obvious discrete full-thickness tears.  Due to his ongoing pain and functional limitations, he is brought to the operating room at this time for planned right shoulder arthroscopy as described below.  Preoperatively I counseled Jeffery Higgins on treatment options as well as risks versus benefits thereof.  Possible surgical complications reviewed including potential bleeding, infection, neurovascular injury, persistent pain, loss of  motion, anesthetic complication, possible need for additional surgery.  He understands and accepts and agrees with our planned procedure.  PROCEDURE IN DETAIL:  After undergoing routine preop evaluation, the patient received prophylactic antibiotics and an interscalene block was established in the holding area by the Anesthesia Department.  He was placed supine on the operating table, underwent smooth induction of a general endotracheal anesthesia.  Turned to left lateral decubitus position on a beanbag and appropriately padded and protected.  Of note, the patient's morbid obesity made positioning require extra help within the operating room to maintain a safe environment for the transferring staff.  After positioning on lateral decubitus, he was appropriately padded and protected and the right shoulder girdle region was then sterilely prepped and draped in standard fashion after having been suspended at 70 degrees abduction with 15 pounds traction.  Of note, right shoulder motion was full.  No instability patterns.  Once the shoulder girdle region was sterilely prepped and draped in standard fashion, the time-out was called.  A posterior portal was established in the glenohumeral joint, the anterior portal was established under direct visualization.  The glenohumeral articular surfaces were in good condition.  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  We went ahead and divided this band of tissue and also used a shaver to debride the labral tear back to stable margin. The rotator cuff was carefully inspected and found to have some minimal fraying posteriorly but no  significant defects and no instability patterns.  Biceps anchor was stable.  At this point, the Arthrex wand was then used to obtain hemostasis.  Fluid and instrument removed.  The arm was dropped down to 30 degrees abduction with the arthroscope introduced in the subacromial space of the posterior portal and a direct lateral portal was established in the subacromial space.  Abundant dense bursal tissue and multiple adhesions were encountered and these were all divided and excised with a combination of shaver and Stryker wand.  The wand was then used to remove the periosteum from the undersurface of the anterior half of the acromion and then subacromial decompression was performed with a bur.  Subacromial space was noted to be markedly stenotic and was nicely decompressed.  We then established a portal directly anterior to the distal clavicle and distal clavicle resection was performed with a bur.  Care was taken to confirm visualization of the entire circumference of the distal clavicle to ensure adequate removal of bone.  We then completed the subacromial/subdeltoid bursectomy.  The bursal surface rotator cuff was probed, inspected, and found to be intact with only some very minimal superficial fraying.  We then completed the subacromial/subdeltoid bursectomy and obtained final hemostasis.  Tracey Shuford, PA-C was used as an Geophysicist/field seismologist throughout this case and essential for help with positioning extremity, positioning the patient, management of the arthroscopic equipment, and assistance considering the patient's morbid obesity with BMI greater than 40.  Portals were closed with Monocryl and Steri-Strips.  A dry dressing placed in the right shoulder.  Right arm was placed in a sling and the patient was then awakened, extubated, and taken to recovery room in stable condition.     Vania Rea. Mariaclara Spear, M.D.     KMS/MEDQ  D:  07/07/2012  T:  07/08/2012  Job:  [id number]

## 2012-07-30 ENCOUNTER — Inpatient Hospital Stay (HOSPITAL_COMMUNITY)
Admission: EM | Admit: 2012-07-30 | Discharge: 2012-08-03 | DRG: 494 | Disposition: A | Discharge status: Home / Self Care | Payer: BC Managed Care – PPO | Attending: General Surgery | Admitting: General Surgery

## 2012-07-30 ENCOUNTER — Encounter (HOSPITAL_COMMUNITY): Payer: Self-pay | Admitting: Emergency Medicine

## 2012-07-30 ENCOUNTER — Encounter (HOSPITAL_COMMUNITY): Payer: Self-pay | Admitting: Family Medicine

## 2012-07-30 ENCOUNTER — Emergency Department (HOSPITAL_COMMUNITY)
Admission: EM | Admit: 2012-07-30 | Discharge: 2012-07-30 | Disposition: A | Discharge status: Home / Self Care | Payer: BC Managed Care – PPO | Attending: Emergency Medicine | Admitting: Emergency Medicine

## 2012-07-30 ENCOUNTER — Emergency Department (HOSPITAL_COMMUNITY): Payer: BC Managed Care – PPO

## 2012-07-30 DIAGNOSIS — G4730 Sleep apnea, unspecified: Secondary | ICD-10-CM | POA: Diagnosis present

## 2012-07-30 DIAGNOSIS — K8000 Calculus of gallbladder with acute cholecystitis without obstruction: Principal | ICD-10-CM | POA: Diagnosis present

## 2012-07-30 DIAGNOSIS — E119 Type 2 diabetes mellitus without complications: Secondary | ICD-10-CM

## 2012-07-30 DIAGNOSIS — I10 Essential (primary) hypertension: Secondary | ICD-10-CM | POA: Diagnosis present

## 2012-07-30 DIAGNOSIS — Z6841 Body Mass Index (BMI) 40.0 and over, adult: Secondary | ICD-10-CM

## 2012-07-30 DIAGNOSIS — K8050 Calculus of bile duct without cholangitis or cholecystitis without obstruction: Secondary | ICD-10-CM

## 2012-07-30 DIAGNOSIS — Z8739 Personal history of other diseases of the musculoskeletal system and connective tissue: Secondary | ICD-10-CM | POA: Insufficient documentation

## 2012-07-30 DIAGNOSIS — K8010 Calculus of gallbladder with chronic cholecystitis without obstruction: Secondary | ICD-10-CM | POA: Diagnosis present

## 2012-07-30 DIAGNOSIS — R7402 Elevation of levels of lactic acid dehydrogenase (LDH): Secondary | ICD-10-CM | POA: Insufficient documentation

## 2012-07-30 DIAGNOSIS — R7401 Elevation of levels of liver transaminase levels: Secondary | ICD-10-CM | POA: Insufficient documentation

## 2012-07-30 DIAGNOSIS — E1169 Type 2 diabetes mellitus with other specified complication: Secondary | ICD-10-CM

## 2012-07-30 DIAGNOSIS — G4733 Obstructive sleep apnea (adult) (pediatric): Secondary | ICD-10-CM | POA: Diagnosis present

## 2012-07-30 DIAGNOSIS — Z79899 Other long term (current) drug therapy: Secondary | ICD-10-CM | POA: Insufficient documentation

## 2012-07-30 DIAGNOSIS — K8020 Calculus of gallbladder without cholecystitis without obstruction: Secondary | ICD-10-CM | POA: Insufficient documentation

## 2012-07-30 DIAGNOSIS — Z87442 Personal history of urinary calculi: Secondary | ICD-10-CM | POA: Insufficient documentation

## 2012-07-30 DIAGNOSIS — R748 Abnormal levels of other serum enzymes: Secondary | ICD-10-CM

## 2012-07-30 HISTORY — DX: Type 2 diabetes mellitus with other specified complication: E11.69

## 2012-07-30 HISTORY — DX: Obesity, unspecified: E66.9

## 2012-07-30 HISTORY — DX: Body Mass Index (BMI) 40.0 and over, adult: Z684

## 2012-07-30 LAB — CBC WITH DIFFERENTIAL/PLATELET
Basophils Relative: 1 % (ref 0–1)
Eosinophils Absolute: 0.1 10*3/uL (ref 0.0–0.7)
Hemoglobin: 14.7 g/dL (ref 13.0–17.0)
Hemoglobin: 15.2 g/dL (ref 13.0–17.0)
Lymphocytes Relative: 33 % (ref 12–46)
Lymphs Abs: 2.5 10*3/uL (ref 0.7–4.0)
MCH: 29.9 pg (ref 26.0–34.0)
MCHC: 34 g/dL (ref 30.0–36.0)
Monocytes Relative: 9 % (ref 3–12)
Monocytes Relative: 7 % (ref 3–12)
Neutrophils Relative %: 54 % (ref 43–77)
Neutrophils Relative %: 64 % (ref 43–77)
Platelets: 173 10*3/uL (ref 150–400)
Platelets: ADEQUATE 10*3/uL (ref 150–400)
RBC: 4.96 MIL/uL (ref 4.22–5.81)
RDW: 13.8 % (ref 11.5–15.5)
WBC: 7.5 10*3/uL (ref 4.0–10.5)

## 2012-07-30 LAB — COMPREHENSIVE METABOLIC PANEL
ALT: 169 U/L — ABNORMAL HIGH (ref 0–53)
Albumin: 3.7 g/dL (ref 3.5–5.2)
Alkaline Phosphatase: 66 U/L (ref 39–117)
Alkaline Phosphatase: 74 U/L (ref 39–117)
BUN: 11 mg/dL (ref 6–23)
CO2: 26 mEq/L (ref 19–32)
Chloride: 103 mEq/L (ref 96–112)
Creatinine, Ser: 0.78 mg/dL (ref 0.50–1.35)
GFR calc Af Amer: >90 mL/min (ref >90)
GFR calc non Af Amer: >90 mL/min (ref >90)
Glucose, Bld: 134 mg/dL — ABNORMAL HIGH (ref 70–99)
Potassium: 4.8 mEq/L (ref 3.5–5.1)
Potassium: 4.3 mEq/L (ref 3.5–5.1)
Sodium: 140 mEq/L (ref 135–145)
Total Bilirubin: 0.7 mg/dL (ref 0.3–1.2)
Total Protein: 7.4 g/dL (ref 6.0–8.3)

## 2012-07-30 LAB — LIPASE, BLOOD: Lipase: 28 U/L (ref 11–59)

## 2012-07-30 MED ORDER — SODIUM CHLORIDE 0.9 % IV BOLUS (SEPSIS)
1000.0000 mL | Freq: Once | INTRAVENOUS | Status: AC
  Administered 2012-07-30: 1000 mL via INTRAVENOUS

## 2012-07-30 MED ORDER — SODIUM CHLORIDE 0.9 % IV SOLN
Freq: Once | INTRAVENOUS | Status: AC
  Administered 2012-07-30: 20 mL/h via INTRAVENOUS

## 2012-07-30 MED ORDER — PANTOPRAZOLE SODIUM 40 MG IV SOLR
40.0000 mg | Freq: Every day | INTRAVENOUS | Status: DC
  Administered 2012-07-30 – 2012-08-02 (×4): 40 mg via INTRAVENOUS
  Filled 2012-07-30 (×5): qty 40

## 2012-07-30 MED ORDER — ACETAMINOPHEN 325 MG PO TABS
650.0000 mg | ORAL_TABLET | Freq: Four times a day (QID) | ORAL | Status: DC | PRN
  Administered 2012-07-31 (×2): 650 mg via ORAL
  Filled 2012-07-30 (×2): qty 2

## 2012-07-30 MED ORDER — ONDANSETRON HCL 4 MG/2ML IJ SOLN
4.0000 mg | Freq: Once | INTRAMUSCULAR | Status: AC
  Administered 2012-07-30: 4 mg via INTRAVENOUS
  Filled 2012-07-30: qty 2

## 2012-07-30 MED ORDER — INSULIN ASPART 100 UNIT/ML ~~LOC~~ SOLN
0.0000 [IU] | SUBCUTANEOUS | Status: DC
  Administered 2012-08-02 – 2012-08-03 (×4): 1 [IU] via SUBCUTANEOUS

## 2012-07-30 MED ORDER — POTASSIUM CHLORIDE IN NACL 20-0.9 MEQ/L-% IV SOLN
INTRAVENOUS | Status: DC
  Administered 2012-07-30 – 2012-08-02 (×6): via INTRAVENOUS
  Filled 2012-07-30 (×11): qty 1000

## 2012-07-30 MED ORDER — PIPERACILLIN-TAZOBACTAM 3.375 G IVPB 30 MIN
3.3750 g | Freq: Once | INTRAVENOUS | Status: AC
  Administered 2012-07-30: 3.375 g via INTRAVENOUS
  Filled 2012-07-30: qty 50

## 2012-07-30 MED ORDER — PIPERACILLIN-TAZOBACTAM 3.375 G IVPB
3.3750 g | Freq: Three times a day (TID) | INTRAVENOUS | Status: DC
  Administered 2012-07-31 – 2012-08-02 (×8): 3.375 g via INTRAVENOUS
  Filled 2012-07-30 (×11): qty 50

## 2012-07-30 MED ORDER — HYDROMORPHONE HCL PF 1 MG/ML IJ SOLN
0.5000 mg | Freq: Once | INTRAMUSCULAR | Status: AC
  Administered 2012-07-30: 0.5 mg via INTRAVENOUS

## 2012-07-30 MED ORDER — LOSARTAN POTASSIUM 50 MG PO TABS
50.0000 mg | ORAL_TABLET | Freq: Every day | ORAL | Status: DC
  Administered 2012-07-31 – 2012-08-03 (×4): 50 mg via ORAL
  Filled 2012-07-30 (×4): qty 1

## 2012-07-30 MED ORDER — METFORMIN HCL 500 MG PO TABS
500.0000 mg | ORAL_TABLET | Freq: Every day | ORAL | Status: DC
  Administered 2012-08-01: 500 mg via ORAL
  Filled 2012-07-30 (×5): qty 1

## 2012-07-30 MED ORDER — ACETAMINOPHEN 650 MG RE SUPP: 650.0000 mg | Freq: Four times a day (QID) | RECTAL | Status: DC | PRN

## 2012-07-30 MED ORDER — HYDROMORPHONE HCL PF 1 MG/ML IJ SOLN
1.0000 mg | INTRAMUSCULAR | Status: DC | PRN
  Administered 2012-07-31 (×2): 2 mg via INTRAVENOUS
  Filled 2012-07-30 (×2): qty 2

## 2012-07-30 MED ORDER — ONDANSETRON HCL 4 MG/2ML IJ SOLN
4.0000 mg | Freq: Once | INTRAMUSCULAR | Status: AC
  Administered 2012-07-30: 4 mg via INTRAVENOUS

## 2012-07-30 MED ORDER — ONDANSETRON HCL 4 MG/2ML IJ SOLN
4.0000 mg | Freq: Four times a day (QID) | INTRAMUSCULAR | Status: DC | PRN
  Administered 2012-07-31 – 2012-08-02 (×4): 4 mg via INTRAVENOUS
  Filled 2012-07-30 (×4): qty 2

## 2012-07-30 MED ORDER — ONDANSETRON HCL 4 MG/2ML IJ SOLN
INTRAMUSCULAR | Status: AC
  Filled 2012-07-30: qty 2

## 2012-07-30 MED ORDER — ONDANSETRON 4 MG PO TBDP: 4.0000 mg | ORAL_TABLET | Freq: Three times a day (TID) | ORAL | Status: DC | PRN

## 2012-07-30 MED ORDER — PROMETHAZINE HCL 25 MG/ML IJ SOLN
12.5000 mg | Freq: Three times a day (TID) | INTRAMUSCULAR | Status: DC | PRN
  Administered 2012-07-30 – 2012-07-31 (×2): 12.5 mg via INTRAVENOUS
  Filled 2012-07-30 (×3): qty 1

## 2012-07-30 MED ORDER — AMLODIPINE BESYLATE 5 MG PO TABS
5.0000 mg | ORAL_TABLET | Freq: Every day | ORAL | Status: DC
  Administered 2012-07-31 – 2012-08-03 (×4): 5 mg via ORAL
  Filled 2012-07-30 (×4): qty 1

## 2012-07-30 MED ORDER — HYDROMORPHONE HCL PF 1 MG/ML IJ SOLN
1.0000 mg | Freq: Once | INTRAMUSCULAR | Status: AC
  Administered 2012-07-30: 1 mg via INTRAVENOUS
  Filled 2012-07-30: qty 1

## 2012-07-30 MED ORDER — OXYCODONE-ACETAMINOPHEN 5-325 MG PO TABS: 1.0000 | ORAL_TABLET | ORAL | Status: DC | PRN

## 2012-07-30 MED ORDER — HYDROMORPHONE HCL PF 1 MG/ML IJ SOLN
0.5000 mg | Freq: Once | INTRAMUSCULAR | Status: AC
  Administered 2012-07-30: 0.5 mg via INTRAVENOUS
  Filled 2012-07-30: qty 1

## 2012-07-30 NOTE — ED Notes (Signed)
Back from [place], no changes, calm, NAD, alert.

## 2012-07-30 NOTE — ED Notes (Signed)
Pt ambulated around Pod A and down to Pod B and back to room.  Pt continues to c/o abdominal pain.

## 2012-07-30 NOTE — ED Provider Notes (Signed)
History     CSN: [id number]  Arrival date & time 07/30/12  1531   First MD Initiated Contact with Patient 07/30/12 1623      Chief Complaint  Patient presents with  . Abdominal Pain    (Consider location/radiation/quality/duration/timing/severity/associated sxs/prior treatment) HPI Pt seen last night for RUQ pain found to have elevated transaminases and gall stones, but no findings of cholecystitis and pain was well controlled. He was discharged with pain meds and Gen Surg referral. States woke up this morning with return of pain, moderate to severe aching RUQ pain, associated with nausea, but no vomiting. No fever. He had brief improvement in pain with percocet at home but pain returned shortly thereafter.   Past Medical History  Diagnosis Date  . Hypertension   . Sleep apnea     not using CPAP - could not tolerate  . Diabetes mellitus without complication   . History of kidney stones 2005  . Arthritis     Past Surgical History  Procedure Date  . Lithotripsy   . Shoulder arthroscopy with subacromial decompression 07/07/2012    Procedure: SHOULDER ARTHROSCOPY WITH SUBACROMIAL DECOMPRESSION;  Surgeon: [name], MD;  Location: [facility] OR;  Service: Orthopedics;  Laterality: Right;  with distal clavicle resection    History reviewed. No pertinent family history.  History  Substance Use Topics  . Smoking status: Never Smoker   . Smokeless tobacco: Not on file  . Alcohol Use: Yes     Comment: occasional       Review of Systems All other systems reviewed and are negative except as noted in HPI.   Allergies  Aspirin  Home Medications   Current Outpatient Rx  Name  Route  Sig  Dispense  Refill  . AMLODIPINE BESYLATE 5 MG PO TABS   Oral   Take 5 mg by mouth daily.         [name] LOSARTAN POTASSIUM 50 MG PO TABS   Oral   Take 50 mg by mouth daily.         [name] METFORMIN HCL 500 MG PO TABS   Oral   Take 500 mg by mouth daily.         . OXYCODONE-ACETAMINOPHEN  5-325 MG PO TABS   Oral   Take 1 tablet by mouth every 4 (four) hours as needed for pain.   20 tablet   0   . ONDANSETRON 4 MG PO TBDP   Oral   Take 1 tablet (4 mg total) by mouth every 8 (eight) hours as needed for nausea.   10 tablet   0     BP 147/78  Pulse 70  Temp 97.7 F (36.5 C) (Oral)  Resp 18  SpO2 95%  Physical Exam  Nursing note and vitals reviewed. Constitutional: He is oriented to person, place, and time. He appears well-developed and well-nourished.  HENT:  Head: Normocephalic and atraumatic.  Eyes: EOM are normal. Pupils are equal, round, and reactive to light.  Neck: Normal range of motion. Neck supple.  Cardiovascular: Normal rate, normal heart sounds and intact distal pulses.   Pulmonary/Chest: Effort normal and breath sounds normal.  Abdominal: Bowel sounds are normal. He exhibits no distension. There is tenderness (mild RUQ tenderness, neg Murphy's). There is no rebound.  Musculoskeletal: Normal range of motion. He exhibits no edema and no tenderness.  Neurological: He is alert and oriented to person, place, and time. He has normal strength. No cranial nerve deficit or sensory deficit.  Skin: Skin is warm and dry. No rash noted.  Psychiatric: He has a normal mood and affect.    ED Course  Procedures (including critical care time)  Labs Reviewed  COMPREHENSIVE METABOLIC PANEL - Abnormal; Notable for the following:    Glucose, Bld 132 (*)     AST 390 (*)     ALT 484 (*)     Total Bilirubin 2.7 (*)     All other components within normal limits  GLUCOSE, CAPILLARY - Abnormal; Notable for the following:    Glucose-Capillary 118 (*)     All other components within normal limits  CBC WITH DIFFERENTIAL  LIPASE, BLOOD  HEMOGLOBIN A1C  COMPREHENSIVE METABOLIC PANEL  CBC   US Abdomen Complete  07/30/2012  *RADIOLOGY REPORT*  Clinical Data:  Abdominal pain, nausea and vomiting.  COMPLETE ABDOMINAL ULTRASOUND  Comparison:  None.  Findings: Examination  is technically limited due to body habitus and overlying bowel gas.  Gallbladder:  Several small stones are demonstrated in the gallbladder.  No significant gallbladder wall thickening. Suggestion of mild sludge.  There are echogenic foci in the nondependent portion of the gallbladder suggesting small polyps or adenomyomatosis.Murphy's sign is negative.  Common bile duct:  Normal caliber with measured diameter of 5.4 mm.  Liver:  Diffusely increased hepatic echotexture suggesting fatty infiltration.  No focal lesions appreciated.  IVC:  Appears normal.  Pancreas:  Pancreas is not visualized due to overlying bowel gas.  Spleen:  Spleen length measures 10.8 cm.  Normal parenchymal echotexture.  Right Kidney:  Right kidney measures 12.4 cm length.  No hydronephrosis.  Left Kidney:  Left kidney measures 14 cm length.  No hydronephrosis.  Abdominal aorta:  No aneurysm identified.  IMPRESSION: Cholelithiasis without associated inflammatory signs.  Non dependent echogenic foci on the gallbladder wall suggest polyps or adenomyomatosis.  Diffuse fatty infiltration of the liver.   Original Report Authenticated By: Burman Nieves, M.D.      1. Biliary colic   2. Abnormal transaminases       MDM  Worsening transaminases and hyperbilirubinemia. Discussed with Dr. Lindie Spruce who will admit for further eval.         Leonette Most B. Bernette Mayers, MD 07/30/12 2251

## 2012-07-30 NOTE — ED Notes (Signed)
EDP into room 

## 2012-07-30 NOTE — ED Provider Notes (Signed)
History     CSN: [id number]  Arrival date & time 07/30/12  0133   First MD Initiated Contact with Patient 07/30/12 0247      Chief Complaint  Patient presents with  . Abdominal Pain    (Consider location/radiation/quality/duration/timing/severity/associated sxs/prior treatment) HPI Comments: 56 year old male with a history of hypertension, history of kidney stones who presents with complaint of right upper quadrant pain. This was acute in onset at 10:30 this evening, it started several hours after eating chicken soup however he had family members eating the same food which did not get sick. The pain has been persistent for the last 4 hours, nothing makes it better or worse, it is associated with nausea and vomiting. He had one loose stool but no overt diarrhea or blood in the stools. He denies any back pain, hematuria, fevers or chills. On arrival the patient was given medication for nausea and pain and at this time his symptoms have significantly improved. He does not have any history of abdominal surgery or gallstones.  Patient is a 56 y.o. male presenting with abdominal pain. The history is provided by the patient and a relative.  Abdominal Pain The primary symptoms of the illness include abdominal pain.    Past Medical History  Diagnosis Date  . Hypertension   . Sleep apnea     not using CPAP - could not tolerate  . Diabetes mellitus without complication   . History of kidney stones 2005  . Arthritis     Past Surgical History  Procedure Date  . Lithotripsy   . Shoulder arthroscopy with subacromial decompression 07/07/2012    Procedure: SHOULDER ARTHROSCOPY WITH SUBACROMIAL DECOMPRESSION;  Surgeon: [name], MD;  Location: [facility] OR;  Service: Orthopedics;  Laterality: Right;  with distal clavicle resection    No family history on file.  History  Substance Use Topics  . Smoking status: Never Smoker   . Smokeless tobacco: Not on file  . Alcohol Use: Yes     Comment:  occasional       Review of Systems  Gastrointestinal: Positive for abdominal pain.  All other systems reviewed and are negative.    Allergies  Aspirin  Home Medications   Current Outpatient Rx  Name  Route  Sig  Dispense  Refill  . AMLODIPINE BESYLATE 5 MG PO TABS   Oral   Take 5 mg by mouth daily.         [name] LOSARTAN POTASSIUM 50 MG PO TABS   Oral   Take 50 mg by mouth daily.         [name] METFORMIN HCL 500 MG PO TABS   Oral   Take 500 mg by mouth daily.         [name] ONDANSETRON 4 MG PO TBDP   Oral   Take 1 tablet (4 mg total) by mouth every 8 (eight) hours as needed for nausea.   10 tablet   0   . OXYCODONE-ACETAMINOPHEN 5-325 MG PO TABS   Oral   Take 1 tablet by mouth every 4 (four) hours as needed for pain.   20 tablet   0     BP 178/105  Pulse 76  Temp 97.4 F (36.3 C) (Oral)  Resp 16  SpO2 95%  Physical Exam  Nursing note and vitals reviewed. Constitutional: He appears well-developed and well-nourished. No distress.  HENT:  Head: Normocephalic and atraumatic.  Mouth/Throat: Oropharynx is clear and moist. No oropharyngeal exudate.  Eyes: Conjunctivae normal  and EOM are normal. Pupils are equal, round, and reactive to light. Right eye exhibits no discharge. Left eye exhibits no discharge. No scleral icterus.  Neck: Normal range of motion. Neck supple. No JVD present. No thyromegaly present.  Cardiovascular: Normal rate, regular rhythm, normal heart sounds and intact distal pulses.  Exam reveals no gallop and no friction rub.   No murmur heard. Pulmonary/Chest: Effort normal and breath sounds normal. No respiratory distress. He has no wheezes. He has no rales.  Abdominal: Soft. Bowel sounds are normal. He exhibits no distension and no mass. There is no tenderness.       Obese rotund abdomen which is soft and nontender on my exam. This was after pain medications were given  Musculoskeletal: Normal range of motion. He exhibits no edema and no tenderness.   Lymphadenopathy:    He has no cervical adenopathy.  Neurological: He is alert. Coordination normal.  Skin: Skin is warm and dry. No rash noted. No erythema.  Psychiatric: He has a normal mood and affect. His behavior is normal.    ED Course  Procedures (including critical care time)  Labs Reviewed  COMPREHENSIVE METABOLIC PANEL - Abnormal; Notable for the following:    Glucose, Bld 134 (*)     AST 222 (*)     ALT 169 (*)     All other components within normal limits  CBC WITH DIFFERENTIAL  LIPASE, BLOOD  URINALYSIS, MICROSCOPIC ONLY   US Abdomen Complete  07/30/2012  *RADIOLOGY REPORT*  Clinical Data:  Abdominal pain, nausea and vomiting.  COMPLETE ABDOMINAL ULTRASOUND  Comparison:  None.  Findings: Examination is technically limited due to body habitus and overlying bowel gas.  Gallbladder:  Several small stones are demonstrated in the gallbladder.  No significant gallbladder wall thickening. Suggestion of mild sludge.  There are echogenic foci in the nondependent portion of the gallbladder suggesting small polyps or adenomyomatosis.Murphy's sign is negative.  Common bile duct:  Normal caliber with measured diameter of 5.4 mm.  Liver:  Diffusely increased hepatic echotexture suggesting fatty infiltration.  No focal lesions appreciated.  IVC:  Appears normal.  Pancreas:  Pancreas is not visualized due to overlying bowel gas.  Spleen:  Spleen length measures 10.8 cm.  Normal parenchymal echotexture.  Right Kidney:  Right kidney measures 12.4 cm length.  No hydronephrosis.  Left Kidney:  Left kidney measures 14 cm length.  No hydronephrosis.  Abdominal aorta:  No aneurysm identified.  IMPRESSION: Cholelithiasis without associated inflammatory signs.  Non dependent echogenic foci on the gallbladder wall suggest polyps or adenomyomatosis.  Diffuse fatty infiltration of the liver.   Original Report Authenticated By: Burman Nieves, M.D.      1. Biliary colic   2. Transaminitis       MDM   On my exam the patient does not have a Murphy sign, he has no other abdominal tenderness. His lab work however shows elevated liver function tests but no leukocytosis, no elevated lipase. He will receive an ultrasound of his gallbladder to rule out acute cholecystitis. Intravenous Zofran and Dilaudid have been given with significant improvement in symptoms.   On repeat exam the patient has no abdominal tenderness. I have discussed with the patient his transaminitis as well as the ultrasound results showing gallstones but no cholecystitis. I have also discussed his findings and exam with the general surgeon, Dr. Dwain Sarna. He has agreed to see the patient in the office this week and agrees that as long as he is pain-free and  nausea free which he is at this time that he can be discharged to followup as an outpatient. Patient has been explained his results and indications for return and has expressed his understanding.  On repeat exam before discharge the patient is a soft nontender abdomen.   Vida Roller, MD 07/30/12 [phone]

## 2012-07-30 NOTE — Progress Notes (Signed)
ANTIBIOTIC CONSULT NOTE - INITIAL  Pharmacy Consult for Zosyn Indication: Cholecystitis  Allergies  Allergen Reactions  . Aspirin Swelling    Patient Measurements:     Vital Signs: Temp: 97.7 F (36.5 C) (12/14 1536) Temp src: Oral (12/14 1536) BP: 147/78 mmHg (12/14 1536) Pulse Rate: 70  (12/14 1536) Intake/Output from previous day:   Intake/Output from this shift:    Labs:  Basename 07/30/12 1553 07/30/12 0140  WBC 5.3 7.5  HGB 15.2 14.7  PLT PLATELET CLUMPS NOTED ON SMEAR, COUNT APPEARS ADEQUATE 173  LABCREA -- --  CREATININE 0.78 0.78   The CrCl is unknown because both a height and weight (above a minimum accepted value) are required for this calculation. No results found for this basename: VANCOTROUGH:2,VANCOPEAK:2,VANCORANDOM:2,GENTTROUGH:2,GENTPEAK:2,GENTRANDOM:2,TOBRATROUGH:2,TOBRAPEAK:2,TOBRARND:2,AMIKACINPEAK:2,AMIKACINTROU:2,AMIKACIN:2, in the last 72 hours   Microbiology: No results found for this or any previous visit (from the past 720 hour(s)).  Medical History: Past Medical History  Diagnosis Date  . Hypertension   . Sleep apnea     not using CPAP - could not tolerate  . Diabetes mellitus without complication   . History of kidney stones 2005  . Arthritis     Medications:  Amlodipine, losartan, metformin,zofran, percocet  Assessment: Mr.Yaworski was admitted with abdominal pain and was found to have cholelithiasis. Noted LFTs are elevated on admit, with Tbil is elevated. WBC is nml, Scr is nml at baseline.  Plan:  - Zosyn 3.375gm IV over 30 minutes now, then Zosyn 3.375gm IV Q8h, each dose infused over 4 hours starting at 0300 on 12/15. - Will monitor cx/spec/sens, renal fn and clinical status daily.  Thanks, Ahnesty Finfrock K. Allena Katz, PharmD, BCPS.  Clinical Pharmacist Pager [phone]. 07/30/2012 7:23 PM

## 2012-07-30 NOTE — ED Notes (Signed)
Preparing to go to [place]

## 2012-07-30 NOTE — H&P (Addendum)
Jeffery Higgins is an 56 y.o. male.   Chief Complaint: Abdominal pain with nausea. HPI: Patient was seen in ED yesterday evening with same symptoms and known gallstones.  Actually he was diagnoses with gallstones about 20 years ago but surgery was not recommended.  He had not had any severe attacks until yesterday when the pain was 10/10, but it improved to the point where he could go home with oral pain medications.  His LFTs were abnormal yesterday and are worse today.  He was awakened from sleep with his RUQ abdominal pain and came to the ED for evaluation.  Past Medical History  Diagnosis Date  . Hypertension   . Sleep apnea     not using CPAP - could not tolerate  . Diabetes mellitus without complication   . History of kidney stones 2005  . Arthritis     Past Surgical History  Procedure Date  . Lithotripsy   . Shoulder arthroscopy with subacromial decompression 07/07/2012    Procedure: SHOULDER ARTHROSCOPY WITH SUBACROMIAL DECOMPRESSION;  Surgeon: [name], MD;  Location: [facility] OR;  Service: Orthopedics;  Laterality: Right;  with distal clavicle resection    History reviewed. No pertinent family history. Social History:  reports that he has never smoked. He does not have any smokeless tobacco history on file. He reports that he drinks alcohol. He reports that he does not use illicit drugs.  Allergies:  Allergies  Allergen Reactions  . Aspirin Swelling     (Not in a hospital admission)  Results for orders placed during the hospital encounter of 07/30/12 (from the past 48 hour(s))  CBC WITH DIFFERENTIAL     Status: Normal   Collection Time   07/30/12  3:53 PM      Component Value Range Comment   WBC 5.3  4.0 - 10.5 K/uL    RBC 5.08  4.22 - 5.81 MIL/uL    Hemoglobin 15.2  13.0 - 17.0 g/dL    HCT 16.1  09.6 - 04.5 %    MCV 88.0  78.0 - 100.0 fL    MCH 29.9  26.0 - 34.0 pg    MCHC 34.0  30.0 - 36.0 g/dL    RDW 40.9  81.1 - 91.4 %    Platelets    150 - 400 K/uL    Value: PLATELET CLUMPS NOTED ON SMEAR, COUNT APPEARS ADEQUATE   Neutrophils Relative 64  43 - 77 %    Neutro Abs 3.4  1.7 - 7.7 K/uL    Lymphocytes Relative 26  12 - 46 %    Lymphs Abs 1.4  0.7 - 4.0 K/uL    Monocytes Relative 7  3 - 12 %    Monocytes Absolute 0.4  0.1 - 1.0 K/uL    Eosinophils Relative 3  0 - 5 %    Eosinophils Absolute 0.1  0.0 - 0.7 K/uL    Basophils Relative 1  0 - 1 %    Basophils Absolute 0.0  0.0 - 0.1 K/uL   COMPREHENSIVE METABOLIC PANEL     Status: Abnormal   Collection Time   07/30/12  3:53 PM      Component Value Range Comment   Sodium 138  135 - 145 mEq/L    Potassium 4.3  3.5 - 5.1 mEq/L    Chloride 103  96 - 112 mEq/L    CO2 26  19 - 32 mEq/L    Glucose, Bld 132 (*) 70 - 99 mg/dL  BUN 11  6 - 23 mg/dL    Creatinine, Ser 1.61  0.50 - 1.35 mg/dL    Calcium 9.1  8.4 - 09.6 mg/dL    Total Protein 7.4  6.0 - 8.3 g/dL    Albumin 3.7  3.5 - 5.2 g/dL    AST 045 (*) 0 - 37 U/L    ALT 484 (*) 0 - 53 U/L    Alkaline Phosphatase 74  39 - 117 U/L    Total Bilirubin 2.7 (*) 0.3 - 1.2 mg/dL    GFR calc non Af Amer >90  >90 mL/min    GFR calc Af Amer >90  >90 mL/min   LIPASE, BLOOD     Status: Normal   Collection Time   07/30/12  3:53 PM      Component Value Range Comment   Lipase 28  11 - 59 U/L    US Abdomen Complete  07/30/2012  *RADIOLOGY REPORT*  Clinical Data:  Abdominal pain, nausea and vomiting.  COMPLETE ABDOMINAL ULTRASOUND  Comparison:  None.  Findings: Examination is technically limited due to body habitus and overlying bowel gas.  Gallbladder:  Several small stones are demonstrated in the gallbladder.  No significant gallbladder wall thickening. Suggestion of mild sludge.  There are echogenic foci in the nondependent portion of the gallbladder suggesting small polyps or adenomyomatosis.Murphy's sign is negative.  Common bile duct:  Normal caliber with measured diameter of 5.4 mm.  Liver:  Diffusely increased hepatic echotexture suggesting fatty  infiltration.  No focal lesions appreciated.  IVC:  Appears normal.  Pancreas:  Pancreas is not visualized due to overlying bowel gas.  Spleen:  Spleen length measures 10.8 cm.  Normal parenchymal echotexture.  Right Kidney:  Right kidney measures 12.4 cm length.  No hydronephrosis.  Left Kidney:  Left kidney measures 14 cm length.  No hydronephrosis.  Abdominal aorta:  No aneurysm identified.  IMPRESSION: Cholelithiasis without associated inflammatory signs.  Non dependent echogenic foci on the gallbladder wall suggest polyps or adenomyomatosis.  Diffuse fatty infiltration of the liver.   Original Report Authenticated By: Burman Nieves, M.D.     Review of Systems  Constitutional: Negative.  Negative for fever and chills.  HENT: Negative.   Gastrointestinal: Positive for nausea, vomiting and abdominal pain. Negative for diarrhea.  Genitourinary: Negative.   Musculoskeletal: Negative.   Skin: Negative.   Neurological: Negative.   Endo/Heme/Allergies: Negative.   Psychiatric/Behavioral: Negative.     Blood pressure 147/78, pulse 70, temperature 97.7 F (36.5 C), temperature source Oral, resp. rate 18, SpO2 95.00%. Physical Exam  Constitutional: He is oriented to person, place, and time. He appears well-developed and well-nourished.  HENT:  Head: Normocephalic and atraumatic.  Eyes: Conjunctivae normal and EOM are normal. Pupils are equal, round, and reactive to light. No scleral icterus.  Neck: Normal range of motion. Neck supple.  Cardiovascular: Normal rate, regular rhythm and normal heart sounds.   Respiratory: Effort normal and breath sounds normal.  GI: Soft. Bowel sounds are normal. He exhibits no distension and no mass. There is tenderness in the right upper quadrant and epigastric area. There is no rebound and no guarding.  Genitourinary: Prostate normal and penis normal.  Musculoskeletal: Normal range of motion.  Neurological: He is alert and oriented to person, place, and time.  He has normal reflexes.  Skin: Skin is warm and dry.  Psychiatric: He has a normal mood and affect. His behavior is normal. Judgment and thought content normal.  Assessment/Plan RUQ pain with gallstones, abnormal LFTs, elevated Tbili I have spoken with GI who will see the patient in consultation. Gallstones with possible CBD stones.  Either the patient will get ERCP tomorrow or Lap Chole.  I have spoken with GI who has not indicated what they would like to do yet.  Will repeat LFTs and use that to decide course for tomorrow.. Will place the patient on IV antibiotics.  Cherylynn Ridges 07/30/2012, 7:16 PM  Addendum:  The patient is a Jehovah's witness as does not accept blood products.  Marta Lamas. Gae Bon, MD, FACS [phone] [facility]

## 2012-07-30 NOTE — ED Notes (Signed)
C/o R sided abd pain with nausea, vomiting, and diarrhea since 10:30pm.  Denies urinary complaint.

## 2012-07-30 NOTE — ED Notes (Signed)
C/o RUQ pain, sudden onset after eating chicken noodle soup at 2230. Also nv. Tried pepto bismol and vomited. Denies diarrhea but mentions "stool soft". Family at [facility]. Denies obvious sob, blood or fever. No h/o same. "felt fine prior to eating". Labs drawn, CBC resluted and reviewed. Pending CMET & lipase results. Alert, NAD, calm, interactive, skin W&moist.

## 2012-07-30 NOTE — ED Notes (Signed)
Per pt was just here this am for gallbladder issues and the pain is still there after pain meds.

## 2012-07-31 ENCOUNTER — Observation Stay (HOSPITAL_COMMUNITY): Payer: BC Managed Care – PPO

## 2012-07-31 LAB — COMPREHENSIVE METABOLIC PANEL
AST: 229 U/L — ABNORMAL HIGH (ref 0–37)
AST: 135 U/L — ABNORMAL HIGH (ref 0–37)
Albumin: 3.3 g/dL — ABNORMAL LOW (ref 3.5–5.2)
Albumin: 3.5 g/dL (ref 3.5–5.2)
Alkaline Phosphatase: 68 U/L (ref 39–117)
BUN: 10 mg/dL (ref 6–23)
Calcium: 8.8 mg/dL (ref 8.4–10.5)
Chloride: 103 mEq/L (ref 96–112)
Creatinine, Ser: 0.87 mg/dL (ref 0.50–1.35)
Potassium: 3.9 mEq/L (ref 3.5–5.1)
Total Bilirubin: 2.6 mg/dL — ABNORMAL HIGH (ref 0.3–1.2)
Total Bilirubin: 2.4 mg/dL — ABNORMAL HIGH (ref 0.3–1.2)
Total Protein: 6.7 g/dL (ref 6.0–8.3)

## 2012-07-31 LAB — CBC
HCT: 41.8 % (ref 39.0–52.0)
Hemoglobin: 13.8 g/dL (ref 13.0–17.0)
MCV: 87.1 fL (ref 78.0–100.0)
Platelets: 163 10*3/uL (ref 150–400)
Platelets: 163 10*3/uL (ref 150–400)
RBC: 4.8 MIL/uL (ref 4.22–5.81)
RBC: 4.86 MIL/uL (ref 4.22–5.81)
WBC: 5.4 10*3/uL (ref 4.0–10.5)
WBC: 6.5 10*3/uL (ref 4.0–10.5)

## 2012-07-31 LAB — HEMOGLOBIN A1C
Hgb A1c MFr Bld: 6.5 % — ABNORMAL HIGH (ref <5.7)
Mean Plasma Glucose: 140 mg/dL — ABNORMAL HIGH (ref <117)

## 2012-07-31 LAB — GLUCOSE, CAPILLARY
Glucose-Capillary: 106 mg/dL — ABNORMAL HIGH (ref 70–99)
Glucose-Capillary: 105 mg/dL — ABNORMAL HIGH (ref 70–99)
Glucose-Capillary: 92 mg/dL (ref 70–99)

## 2012-07-31 MED ORDER — DIPHENHYDRAMINE HCL 50 MG/ML IJ SOLN
12.5000 mg | Freq: Three times a day (TID) | INTRAMUSCULAR | Status: DC | PRN
  Administered 2012-07-31: 12.5 mg via INTRAVENOUS
  Filled 2012-07-31: qty 1

## 2012-07-31 MED ORDER — MORPHINE SULFATE 2 MG/ML IJ SOLN
2.0000 mg | INTRAMUSCULAR | Status: DC | PRN
  Administered 2012-07-31 – 2012-08-02 (×4): 2 mg via INTRAVENOUS
  Filled 2012-07-31 (×4): qty 1

## 2012-07-31 NOTE — Progress Notes (Signed)
Subjective:  Patient is now pain-free, without nausea, and hungry.  LFTs still elevated, Total bilirubin 2.6, AST 229, ALT 405. WBC 5400. Lipase not repeated. Objective: Vital signs in last 24 hours: Temp:  [97.7 F (36.5 C)-98.4 F (36.9 C)] 98.4 F (36.9 C) (12/15 0527) Pulse Rate:  [61-77] 72  (12/15 0527) Resp:  [18-20] 18  (12/15 0527) BP: (129-164)/(68-86) 136/76 mmHg (12/15 0527) SpO2:  [94 %-98 %] 94 % (12/15 0527) Weight:  [295 lb 1.6 oz (133.856 kg)] 295 lb 1.6 oz (133.856 kg) (12/15 0527)    Intake/Output from previous day:   Intake/Output this shift:    General appearance: alert. No distress. Family in room. . Mental status normal GI: abdomen obese, soft, nontender, no mass. No tenderness to percussion right costal margin. No scars. No hernias.  Lab Results:   Basename 07/31/12 0710 07/30/12 1553  WBC 5.4 5.3  HGB 13.8 15.2  HCT 41.8 44.7  PLT 163 PLATELET CLUMPS NOTED ON SMEAR, COUNT APPEARS ADEQUATE   BMET  Basename 07/31/12 0710 07/30/12 1553  NA 140 138  K 4.0 4.3  CL 105 103  CO2 25 26  GLUCOSE 108* 132*  BUN 10 11  CREATININE 0.87 0.78  CALCIUM 8.8 9.1   PT/INR No results found for this basename: LABPROT:2,INR:2 in the last 72 hours ABG No results found for this basename: PHART:2,PCO2:2,PO2:2,HCO3:2 in the last 72 hours  Studies/Results: US Abdomen Complete  07/30/2012  *RADIOLOGY REPORT*  Clinical Data:  Abdominal pain, nausea and vomiting.  COMPLETE ABDOMINAL ULTRASOUND  Comparison:  None.  Findings: Examination is technically limited due to body habitus and overlying bowel gas.  Gallbladder:  Several small stones are demonstrated in the gallbladder.  No significant gallbladder wall thickening. Suggestion of mild sludge.  There are echogenic foci in the nondependent portion of the gallbladder suggesting small polyps or adenomyomatosis.Murphy's sign is negative.  Common bile duct:  Normal caliber with measured diameter of 5.4 mm.  Liver:   Diffusely increased hepatic echotexture suggesting fatty infiltration.  No focal lesions appreciated.  IVC:  Appears normal.  Pancreas:  Pancreas is not visualized due to overlying bowel gas.  Spleen:  Spleen length measures 10.8 cm.  Normal parenchymal echotexture.  Right Kidney:  Right kidney measures 12.4 cm length.  No hydronephrosis.  Left Kidney:  Left kidney measures 14 cm length.  No hydronephrosis.  Abdominal aorta:  No aneurysm identified.  IMPRESSION: Cholelithiasis without associated inflammatory signs.  Non dependent echogenic foci on the gallbladder wall suggest polyps or adenomyomatosis.  Diffuse fatty infiltration of the liver.   Original Report Authenticated By: Burman Nieves, M.D.     Anti-infectives: Anti-infectives     Start     Dose/Rate Route Frequency Ordered Stop   07/31/12 0300  piperacillin-tazobactam (ZOSYN) IVPB 3.375 g       3.375 g 12.5 mL/hr over 240 Minutes Intravenous Every 8 hours 07/30/12 1916     07/30/12 1930  piperacillin-tazobactam (ZOSYN) IVPB 3.375 g       3.375 g 100 mL/hr over 30 Minutes Intravenous  Once 07/30/12 1916 07/30/12 2103          Assessment/Plan:   Cholelithiasis. Elevated LFTs, persistent Given the absence of pain, fever, or leukocytosis, the persistently elevated liver function tests may well represent CBD stones, Acute cholecystitis is possible, but seems less likely. We will hold off on cholecystectomy now, and await GI consult regarding ERCP. He is advised that he will need cholecystectomy during this admission to resolve  his problems. Continue n.p.o. Continue IV antibiotics. Labs tomorrow   LOS: 1 day    Saket Hellstrom M. Derrell Lolling, M.D., [facility] General and Minimally invasive Surgery Breast and Colorectal Surgery Office:   [phone] Pager:   [phone]  07/31/2012

## 2012-07-31 NOTE — Consult Note (Signed)
[facility] Consult Note  Referring Provider: No ref. provider found Primary Care Physician:  Pcp Not In System Primary Gastroenterologist:  Dr.  Antony Contras Complaint: Abdominal pain nausea and vomiting HPI: Jeffery Higgins is an 56 y.o. latino male  who presents with several day history of intermittent epigastric abdominal pain nausea and vomiting. This also occurred in the right upper quadrant. He was seen in the emergency room on one occasion and released but noted to have elevated liver function tests. He returned the next day was slightly elevated liver function tests. He has an ultrasound which showed gallstones and a 4.7 mm common bile duct with no obvious common duct stones. His lipase is normal. LFTs slightly lower this or name, lipase and WBC count normal. He is having moderate improvement in his pain  Past Medical History  Diagnosis Date  . Hypertension   . Sleep apnea     not using CPAP - could not tolerate  . Diabetes mellitus without complication   . History of kidney stones 2005  . Arthritis     Past Surgical History  Procedure Date  . Lithotripsy   . Shoulder arthroscopy with subacromial decompression 07/07/2012    Procedure: SHOULDER ARTHROSCOPY WITH SUBACROMIAL DECOMPRESSION;  Surgeon: [name], MD;  Location: [facility] OR;  Service: Orthopedics;  Laterality: Right;  with distal clavicle resection    Medications Prior to Admission  Medication Sig Dispense Refill  . amLODipine (NORVASC) 5 MG tablet Take 5 mg by mouth daily.      [name] losartan (COZAAR) 50 MG tablet Take 50 mg by mouth daily.      . metFORMIN (GLUCOPHAGE) 500 MG tablet Take 500 mg by mouth daily.      [name] oxyCODONE-acetaminophen (PERCOCET) 5-325 MG per tablet Take 1 tablet by mouth every 4 (four) hours as needed for pain.  20 tablet  0  . ondansetron (ZOFRAN ODT) 4 MG disintegrating tablet Take 1 tablet (4 mg total) by mouth every 8 (eight) hours as needed for nausea.  10 tablet  0    Allergies:   Allergies  Allergen Reactions  . Aspirin Swelling    History reviewed. No pertinent family history.  Social History:  reports that he has never smoked. He does not have any smokeless tobacco history on file. He reports that he drinks alcohol. He reports that he does not use illicit drugs.  Review of Systems: negative except as above   Blood pressure 136/76, pulse 72, temperature 98.4 F (36.9 C), temperature source Oral, resp. rate 18, height 5\' 10"  (1.778 m), weight 133.856 kg (295 lb 1.6 oz), SpO2 94.00%. Head: Normocephalic, without obvious abnormality, atraumatic Neck: no adenopathy, no carotid bruit, no JVD, supple, symmetrical, trachea midline and thyroid not enlarged, symmetric, no tenderness/mass/nodules Resp: clear to auscultation bilaterally Cardio: regular rate and rhythm, S1, S2 normal, no murmur, click, rub or gallop GI: Abdomen soft moderately tender with normoactive bowel sounds. No hepatosplenomegaly mass or guarding. Extremities: extremities normal, atraumatic, no cyanosis or edema  Results for orders placed during the hospital encounter of 07/30/12 (from the past 48 hour(s))  CBC WITH DIFFERENTIAL     Status: Normal   Collection Time   07/30/12  3:53 PM      Component Value Range Comment   WBC 5.3  4.0 - 10.5 K/uL    RBC 5.08  4.22 - 5.81 MIL/uL    Hemoglobin 15.2  13.0 - 17.0 g/dL    HCT 16.1  09.6 - 04.5 %  MCV 88.0  78.0 - 100.0 fL    MCH 29.9  26.0 - 34.0 pg    MCHC 34.0  30.0 - 36.0 g/dL    RDW 16.1  09.6 - 04.5 %    Platelets    150 - 400 K/uL    Value: PLATELET CLUMPS NOTED ON SMEAR, COUNT APPEARS ADEQUATE   Neutrophils Relative 64  43 - 77 %    Neutro Abs 3.4  1.7 - 7.7 K/uL    Lymphocytes Relative 26  12 - 46 %    Lymphs Abs 1.4  0.7 - 4.0 K/uL    Monocytes Relative 7  3 - 12 %    Monocytes Absolute 0.4  0.1 - 1.0 K/uL    Eosinophils Relative 3  0 - 5 %    Eosinophils Absolute 0.1  0.0 - 0.7 K/uL    Basophils Relative 1  0 - 1 %    Basophils  Absolute 0.0  0.0 - 0.1 K/uL   COMPREHENSIVE METABOLIC PANEL     Status: Abnormal   Collection Time   07/30/12  3:53 PM      Component Value Range Comment   Sodium 138  135 - 145 mEq/L    Potassium 4.3  3.5 - 5.1 mEq/L    Chloride 103  96 - 112 mEq/L    CO2 26  19 - 32 mEq/L    Glucose, Bld 132 (*) 70 - 99 mg/dL    BUN 11  6 - 23 mg/dL    Creatinine, Ser 4.09  0.50 - 1.35 mg/dL    Calcium 9.1  8.4 - 81.1 mg/dL    Total Protein 7.4  6.0 - 8.3 g/dL    Albumin 3.7  3.5 - 5.2 g/dL    AST 914 (*) 0 - 37 U/L    ALT 484 (*) 0 - 53 U/L    Alkaline Phosphatase 74  39 - 117 U/L    Total Bilirubin 2.7 (*) 0.3 - 1.2 mg/dL    GFR calc non Af Amer >90  >90 mL/min    GFR calc Af Amer >90  >90 mL/min   LIPASE, BLOOD     Status: Normal   Collection Time   07/30/12  3:53 PM      Component Value Range Comment   Lipase 28  11 - 59 U/L   GLUCOSE, CAPILLARY     Status: Abnormal   Collection Time   07/30/12  8:17 PM      Component Value Range Comment   Glucose-Capillary 118 (*) 70 - 99 mg/dL   GLUCOSE, CAPILLARY     Status: Abnormal   Collection Time   07/30/12 11:31 PM      Component Value Range Comment   Glucose-Capillary 120 (*) 70 - 99 mg/dL   GLUCOSE, CAPILLARY     Status: Abnormal   Collection Time   07/31/12  3:35 AM      Component Value Range Comment   Glucose-Capillary 106 (*) 70 - 99 mg/dL   COMPREHENSIVE METABOLIC PANEL     Status: Abnormal   Collection Time   07/31/12  7:10 AM      Component Value Range Comment   Sodium 140  135 - 145 mEq/L    Potassium 4.0  3.5 - 5.1 mEq/L    Chloride 105  96 - 112 mEq/L    CO2 25  19 - 32 mEq/L    Glucose, Bld 108 (*) 70 - 99 mg/dL  BUN 10  6 - 23 mg/dL    Creatinine, Ser 2.95  0.50 - 1.35 mg/dL    Calcium 8.8  8.4 - 62.1 mg/dL    Total Protein 6.7  6.0 - 8.3 g/dL    Albumin 3.3 (*) 3.5 - 5.2 g/dL    AST 308 (*) 0 - 37 U/L    ALT 405 (*) 0 - 53 U/L    Alkaline Phosphatase 71  39 - 117 U/L    Total Bilirubin 2.6 (*) 0.3 - 1.2 mg/dL     GFR calc non Af Amer >90  >90 mL/min    GFR calc Af Amer >90  >90 mL/min   CBC     Status: Normal   Collection Time   07/31/12  7:10 AM      Component Value Range Comment   WBC 5.4  4.0 - 10.5 K/uL    RBC 4.80  4.22 - 5.81 MIL/uL    Hemoglobin 13.8  13.0 - 17.0 g/dL    HCT 65.7  84.6 - 96.2 %    MCV 87.1  78.0 - 100.0 fL    MCH 28.8  26.0 - 34.0 pg    MCHC 33.0  30.0 - 36.0 g/dL    RDW 95.2  84.1 - 32.4 %    Platelets 163  150 - 400 K/uL   GLUCOSE, CAPILLARY     Status: Abnormal   Collection Time   07/31/12  7:55 AM      Component Value Range Comment   Glucose-Capillary 105 (*) 70 - 99 mg/dL    Comment 1 Notify RN     LIPASE, BLOOD     Status: Normal   Collection Time   07/31/12  8:15 AM      Component Value Range Comment   Lipase 30  11 - 59 U/L    US Abdomen Complete  07/30/2012  *RADIOLOGY REPORT*  Clinical Data:  Abdominal pain, nausea and vomiting.  COMPLETE ABDOMINAL ULTRASOUND  Comparison:  None.  Findings: Examination is technically limited due to body habitus and overlying bowel gas.  Gallbladder:  Several small stones are demonstrated in the gallbladder.  No significant gallbladder wall thickening. Suggestion of mild sludge.  There are echogenic foci in the nondependent portion of the gallbladder suggesting small polyps or adenomyomatosis.Murphy's sign is negative.  Common bile duct:  Normal caliber with measured diameter of 5.4 mm.  Liver:  Diffusely increased hepatic echotexture suggesting fatty infiltration.  No focal lesions appreciated.  IVC:  Appears normal.  Pancreas:  Pancreas is not visualized due to overlying bowel gas.  Spleen:  Spleen length measures 10.8 cm.  Normal parenchymal echotexture.  Right Kidney:  Right kidney measures 12.4 cm length.  No hydronephrosis.  Left Kidney:  Left kidney measures 14 cm length.  No hydronephrosis.  Abdominal aorta:  No aneurysm identified.  IMPRESSION: Cholelithiasis without associated inflammatory signs.  Non dependent  echogenic foci on the gallbladder wall suggest polyps or adenomyomatosis.  Diffuse fatty infiltration of the liver.   Original Report Authenticated By: Burman Nieves, M.D.     Assessment: Biliary colic with cholelithiasis and suspicion of choledocholithiasis not confirmed by current imaging studies. Plan:  With persistent elevated liver function tests I suspect he does have this done we'll probably need ERCP. However we'll obtain MRCP today for better clarification and is not suggestive of LFTs improve further would consider proceeding with cholecystectomy and intraoperative cholangiogram first. Offie Waide C 07/31/2012, 9:25 AM

## 2012-08-01 LAB — GLUCOSE, CAPILLARY
Glucose-Capillary: 121 mg/dL — ABNORMAL HIGH (ref 70–99)
Glucose-Capillary: 77 mg/dL (ref 70–99)
Glucose-Capillary: 85 mg/dL (ref 70–99)
Glucose-Capillary: 93 mg/dL (ref 70–99)
Glucose-Capillary: 96 mg/dL (ref 70–99)

## 2012-08-01 NOTE — Progress Notes (Signed)
Patient interviewed and examined, agree with PA note above. He is now without pain or tenderness. MR CP was negative for common bile duct stones. I have recommended proceeding with laparoscopic cholecystectomy and cholangiogram. We discussed the indications for the procedure and risks of anesthetic complications, bleeding, infection, bile leak, bile duct injury. He understands and is in agreement and is scheduled for tomorrow. [name] MD, FACS  08/01/2012 7:12 PM

## 2012-08-01 NOTE — Progress Notes (Signed)
No CBD stones on MRCP. No need for ERCP.  Plan per surgery. Call us if needed.

## 2012-08-01 NOTE — Progress Notes (Signed)
Subjective: Resting quietly, no further abdominal pain at present; remains NPO  Objective: Vital signs in last 24 hours: Temp:  [97.9 F (36.6 C)-98.6 F (37 C)] 98.6 F (37 C) (12/16 0539) Pulse Rate:  [60-95] 72  (12/16 0539) Resp:  [18-20] 20  (12/16 0539) BP: (91-160)/(54-85) 154/80 mmHg (12/16 0539) SpO2:  [93 %-98 %] 97 % (12/16 0539)    Intake/Output from previous day: 12/15 0701 - 12/16 0700 In: 1684 [I.V.:1634; IV Piggyback:50] Out: 1000 [Urine:1000] Intake/Output this shift:    General appearance: alert, cooperative, appears stated age, no distress and moderately obese Chest: CTA Cardiac: RRR Abdomen: obese, non tender, + BS No N/V/D Extremities: No edema or tenderness, + pulses. VSS, afebrile MRCP: IMPRESSION:  Gallstones with probable gallbladder polyp. No evidence for  choledocolithiasis. No intra or extrahepatic biliary duct  dilatation.  Lab Results:   [facility] 07/31/12 2302 07/31/12 0710  WBC 6.5 5.4  HGB 14.1 13.8  HCT 42.3 41.8  PLT 163 163   BMET  Basename 07/31/12 2302 07/31/12 0710  NA 139 140  K 3.9 4.0  CL 103 105  CO2 24 25  GLUCOSE 89 108*  BUN 11 10  CREATININE 0.88 0.87  CALCIUM 9.1 8.8   PT/INR No results found for this basename: LABPROT:2,INR:2 in the last 72 hours ABG No results found for this basename: PHART:2,PCO2:2,PO2:2,HCO3:2 in the last 72 hours  Studies/Results: Mr Mrcp  08/01/2012  *RADIOLOGY REPORT*  Clinical Data: Gallstones with right upper quadrant pain and abnormal liver function test.  MRI ABDOMEN WITHOUT CONTRAST (MRCP)  Technique: Multiplanar multisequence MR imaging of the abdomen was performed, including heavily T2-weighted images of the biliary and pancreatic ducts.  Three-dimensional MR images were rendered by post processing of the original MR data.  Comparison:  Ultrasound exam from 07/30/2012  Findings:  Several tiny stones are seen within the lumen of the gallbladder.  There is no evidence for  gallbladder wall thickening or pericholecystic fluid by MR.  The T1 hyperintense non dependent nodule along the anterior wall of the gallbladder fundus may be a tiny 5 mm polyp.  No intra or extrahepatic biliary duct dilatation.  The cystic duct is not dilated.  Common hepatic duct is 6 mm in diameter.  The common bile duct in the head of the pancreas is 6 mm in diameter. There is no evidence for common duct stone.  No pancreatic ductal dilatation.  No focal abnormalities seen in the liver or spleen on this study performed without intravenous contrast material.  The pancreas is normal.  The stomach, duodenum, and adrenal glands have normal imaging features.  There may be a small cyst in the interpolar region of the left kidney, but this has been incompletely characterized.  No abdominal aortic aneurysm.  No free fluid or lymphadenopathy in the abdomen.  Visualized bony structures show no marrow signal abnormality.  IMPRESSION: Gallstones with probable gallbladder polyp.  No evidence for choledocolithiasis.  No intra or extrahepatic biliary duct dilatation.   Original Report Authenticated By: Kennith Center, M.D.    Mr 3d Recon At Scanner  08/01/2012  *RADIOLOGY REPORT*  Clinical Data: Gallstones with right upper quadrant pain and abnormal liver function test.  MRI ABDOMEN WITHOUT CONTRAST (MRCP)  Technique: Multiplanar multisequence MR imaging of the abdomen was performed, including heavily T2-weighted images of the biliary and pancreatic ducts.  Three-dimensional MR images were rendered by post processing of the original MR data.  Comparison:  Ultrasound exam from 07/30/2012  Findings:  Several  tiny stones are seen within the lumen of the gallbladder.  There is no evidence for gallbladder wall thickening or pericholecystic fluid by MR.  The T1 hyperintense non dependent nodule along the anterior wall of the gallbladder fundus may be a tiny 5 mm polyp.  No intra or extrahepatic biliary duct dilatation.  The cystic  duct is not dilated.  Common hepatic duct is 6 mm in diameter.  The common bile duct in the head of the pancreas is 6 mm in diameter. There is no evidence for common duct stone.  No pancreatic ductal dilatation.  No focal abnormalities seen in the liver or spleen on this study performed without intravenous contrast material.  The pancreas is normal.  The stomach, duodenum, and adrenal glands have normal imaging features.  There may be a small cyst in the interpolar region of the left kidney, but this has been incompletely characterized.  No abdominal aortic aneurysm.  No free fluid or lymphadenopathy in the abdomen.  Visualized bony structures show no marrow signal abnormality.  IMPRESSION: Gallstones with probable gallbladder polyp.  No evidence for choledocolithiasis.  No intra or extrahepatic biliary duct dilatation.   Original Report Authenticated By: Kennith Center, M.D.     Anti-infectives: Anti-infectives     Start     Dose/Rate Route Frequency Ordered Stop   07/31/12 0300   piperacillin-tazobactam (ZOSYN) IVPB 3.375 g        3.375 g 12.5 mL/hr over 240 Minutes Intravenous Every 8 hours 07/30/12 1916     07/30/12 1930   piperacillin-tazobactam (ZOSYN) IVPB 3.375 g        3.375 g 100 mL/hr over 30 Minutes Intravenous  Once 07/30/12 1916 07/30/12 2103          Assessment/Plan: s/p * No surgery found * There is no problem list on file for this patient. Cholelithiasis.  Elevated LFTs, persistent  He is advised that he will need cholecystectomy during this admission to resolve his problems. (timing of this will be up to Dr. Adriana Mccallum) Continue n.p.o.  Continue IV antibiotics.  Recheck labs in am      LOS: 2 days    Golda Acre [facility] Surgery Pager # [phone]  08/01/2012

## 2012-08-02 ENCOUNTER — Inpatient Hospital Stay (HOSPITAL_COMMUNITY): Payer: BC Managed Care – PPO

## 2012-08-02 ENCOUNTER — Encounter (HOSPITAL_COMMUNITY): Payer: Self-pay | Admitting: Anesthesiology

## 2012-08-02 ENCOUNTER — Inpatient Hospital Stay (HOSPITAL_COMMUNITY): Payer: BC Managed Care – PPO | Admitting: Anesthesiology

## 2012-08-02 ENCOUNTER — Encounter (HOSPITAL_COMMUNITY): Admission: EM | Disposition: A | Discharge status: Home / Self Care | Payer: Self-pay | Source: Home / Self Care

## 2012-08-02 HISTORY — PX: CHOLECYSTECTOMY: SHX55

## 2012-08-02 LAB — BASIC METABOLIC PANEL
CO2: 25 mEq/L (ref 19–32)
Calcium: 9.1 mg/dL (ref 8.4–10.5)
Chloride: 104 mEq/L (ref 96–112)
Glucose, Bld: 106 mg/dL — ABNORMAL HIGH (ref 70–99)
Potassium: 4.1 mEq/L (ref 3.5–5.1)
Sodium: 140 mEq/L (ref 135–145)

## 2012-08-02 LAB — GLUCOSE, CAPILLARY
Glucose-Capillary: 111 mg/dL — ABNORMAL HIGH (ref 70–99)
Glucose-Capillary: 100 mg/dL — ABNORMAL HIGH (ref 70–99)
Glucose-Capillary: 82 mg/dL (ref 70–99)

## 2012-08-02 SURGERY — LAPAROSCOPIC CHOLECYSTECTOMY WITH INTRAOPERATIVE CHOLANGIOGRAM
Anesthesia: General | Site: Abdomen | Wound class: Contaminated

## 2012-08-02 MED ORDER — LIDOCAINE HCL (CARDIAC) 20 MG/ML IV SOLN
INTRAVENOUS | Status: DC | PRN
  Administered 2012-08-02: 100 mg via INTRAVENOUS

## 2012-08-02 MED ORDER — LACTATED RINGERS IV SOLN
INTRAVENOUS | Status: DC | PRN
  Administered 2012-08-02 (×2): via INTRAVENOUS

## 2012-08-02 MED ORDER — PROMETHAZINE HCL 25 MG/ML IJ SOLN: 6.2500 mg | INTRAMUSCULAR | Status: DC | PRN

## 2012-08-02 MED ORDER — NEOSTIGMINE METHYLSULFATE 1 MG/ML IJ SOLN
INTRAMUSCULAR | Status: DC | PRN
  Administered 2012-08-02: 5 mg via INTRAVENOUS

## 2012-08-02 MED ORDER — BUPIVACAINE-EPINEPHRINE 0.5% -1:200000 IJ SOLN
INTRAMUSCULAR | Status: DC | PRN
  Administered 2012-08-02: 10 mL

## 2012-08-02 MED ORDER — PROPOFOL 10 MG/ML IV BOLUS
INTRAVENOUS | Status: DC | PRN
  Administered 2012-08-02 (×2): 100 mg via INTRAVENOUS
  Administered 2012-08-02: 200 mg via INTRAVENOUS

## 2012-08-02 MED ORDER — ARTIFICIAL TEARS OP OINT
TOPICAL_OINTMENT | OPHTHALMIC | Status: DC | PRN
  Administered 2012-08-02: 1 via OPHTHALMIC

## 2012-08-02 MED ORDER — MORPHINE SULFATE (PF) 0.5 MG/ML IJ SOLN
INTRAMUSCULAR | Status: DC | PRN
  Administered 2012-08-02 (×2): 5 mg via EPIDURAL

## 2012-08-02 MED ORDER — PHENYLEPHRINE HCL 10 MG/ML IJ SOLN
INTRAMUSCULAR | Status: DC | PRN
  Administered 2012-08-02: 160 ug via INTRAVENOUS
  Administered 2012-08-02: 140 ug via INTRAVENOUS

## 2012-08-02 MED ORDER — SUCCINYLCHOLINE CHLORIDE 20 MG/ML IJ SOLN
INTRAMUSCULAR | Status: DC | PRN
  Administered 2012-08-02: 100 mg via INTRAVENOUS

## 2012-08-02 MED ORDER — OXYCODONE HCL 5 MG/5ML PO SOLN: 5.0000 mg | Freq: Once | ORAL | Status: DC | PRN

## 2012-08-02 MED ORDER — MORPHINE SULFATE 2 MG/ML IJ SOLN
2.0000 mg | INTRAMUSCULAR | Status: DC | PRN
  Administered 2012-08-02 – 2012-08-03 (×2): 2 mg via INTRAVENOUS
  Filled 2012-08-02 (×2): qty 1

## 2012-08-02 MED ORDER — HYDROMORPHONE HCL PF 1 MG/ML IJ SOLN
INTRAMUSCULAR | Status: AC
  Filled 2012-08-02: qty 2

## 2012-08-02 MED ORDER — OXYCODONE HCL 5 MG PO TABS: 5.0000 mg | ORAL_TABLET | Freq: Once | ORAL | Status: DC | PRN

## 2012-08-02 MED ORDER — GLYCOPYRROLATE 0.2 MG/ML IJ SOLN
INTRAMUSCULAR | Status: DC | PRN
  Administered 2012-08-02: .8 mg via INTRAVENOUS

## 2012-08-02 MED ORDER — PHENYLEPHRINE HCL 10 MG/ML IJ SOLN
10.0000 mg | INTRAVENOUS | Status: DC | PRN
  Administered 2012-08-02: 25 ug/min via INTRAVENOUS

## 2012-08-02 MED ORDER — SODIUM CHLORIDE 0.9 % IV SOLN
INTRAVENOUS | Status: DC | PRN
  Administered 2012-08-02: 16:00:00

## 2012-08-02 MED ORDER — MEPERIDINE HCL 25 MG/ML IJ SOLN: 6.2500 mg | INTRAMUSCULAR | Status: DC | PRN

## 2012-08-02 MED ORDER — POTASSIUM CHLORIDE IN NACL 20-0.9 MEQ/L-% IV SOLN
INTRAVENOUS | Status: DC
  Administered 2012-08-02 – 2012-08-03 (×2): via INTRAVENOUS
  Filled 2012-08-02 (×3): qty 1000

## 2012-08-02 MED ORDER — SODIUM CHLORIDE 0.9 % IR SOLN
Status: DC | PRN
  Administered 2012-08-02: 1

## 2012-08-02 MED ORDER — OXYCODONE-ACETAMINOPHEN 5-325 MG PO TABS
1.0000 | ORAL_TABLET | ORAL | Status: DC | PRN
  Administered 2012-08-03 (×2): 2 via ORAL
  Filled 2012-08-02 (×2): qty 2

## 2012-08-02 MED ORDER — ROCURONIUM BROMIDE 100 MG/10ML IV SOLN
INTRAVENOUS | Status: DC | PRN
  Administered 2012-08-02: 70 mg via INTRAVENOUS

## 2012-08-02 MED ORDER — FENTANYL CITRATE 0.05 MG/ML IJ SOLN
INTRAMUSCULAR | Status: DC | PRN
  Administered 2012-08-02: 125 ug via INTRAVENOUS
  Administered 2012-08-02: 50 ug via INTRAVENOUS
  Administered 2012-08-02: 75 ug via INTRAVENOUS

## 2012-08-02 MED ORDER — HYDROMORPHONE HCL PF 1 MG/ML IJ SOLN
0.2500 mg | INTRAMUSCULAR | Status: DC | PRN
Start: 2012-08-02 — End: 2012-08-02
  Administered 2012-08-02 (×4): 0.5 mg via INTRAVENOUS

## 2012-08-02 MED ORDER — ONDANSETRON HCL 4 MG/2ML IJ SOLN
INTRAMUSCULAR | Status: DC | PRN
  Administered 2012-08-02: 4 mg via INTRAVENOUS

## 2012-08-02 MED ORDER — BUPIVACAINE-EPINEPHRINE (PF) 0.5% -1:200000 IJ SOLN
INTRAMUSCULAR | Status: AC
  Filled 2012-08-02: qty 10

## 2012-08-02 MED ORDER — LACTATED RINGERS IV SOLN
INTRAVENOUS | Status: DC
  Administered 2012-08-02: 15:00:00 via INTRAVENOUS

## 2012-08-02 SURGICAL SUPPLY — 40 items
APPLIER CLIP ROT 10 11.4 M/L (STAPLE) ×2
BLADE SURG ROTATE 9660 (MISCELLANEOUS) ×8 IMPLANT
CANISTER SUCTION 2500CC (MISCELLANEOUS) ×2 IMPLANT
CHLORAPREP W/TINT 26ML (MISCELLANEOUS) ×2 IMPLANT
CLIP APPLIE ROT 10 11.4 M/L (STAPLE) ×1 IMPLANT
CLOTH BEACON ORANGE TIMEOUT ST (SAFETY) ×2 IMPLANT
COVER MAYO STAND STRL (DRAPES) ×2 IMPLANT
COVER SURGICAL LIGHT HANDLE (MISCELLANEOUS) ×2 IMPLANT
DECANTER SPIKE VIAL GLASS SM (MISCELLANEOUS) ×2 IMPLANT
DERMABOND ADVANCED (GAUZE/BANDAGES/DRESSINGS) ×1
DERMABOND ADVANCED .7 DNX12 (GAUZE/BANDAGES/DRESSINGS) ×1 IMPLANT
DEVICE TROCAR PUNCTURE CLOSURE (ENDOMECHANICALS) ×2 IMPLANT
DRAPE C-ARM 42X72 X-RAY (DRAPES) ×2 IMPLANT
DRAPE UTILITY 15X26 W/TAPE STR (DRAPE) ×4 IMPLANT
ELECT REM PT RETURN 9FT ADLT (ELECTROSURGICAL) ×2
ELECTRODE REM PT RTRN 9FT ADLT (ELECTROSURGICAL) ×1 IMPLANT
GLOVE BIOGEL PI IND STRL 8 (GLOVE) ×1 IMPLANT
GLOVE BIOGEL PI INDICATOR 8 (GLOVE) ×1
GLOVE SS BIOGEL STRL SZ 7.5 (GLOVE) ×1 IMPLANT
GLOVE SUPERSENSE BIOGEL SZ 7.5 (GLOVE) ×1
GOWN PREVENTION PLUS XLARGE (GOWN DISPOSABLE) ×2 IMPLANT
GOWN STRL NON-REIN LRG LVL3 (GOWN DISPOSABLE) ×6 IMPLANT
KIT BASIN OR (CUSTOM PROCEDURE TRAY) ×2 IMPLANT
KIT ROOM TURNOVER OR (KITS) ×2 IMPLANT
NS IRRIG 1000ML POUR BTL (IV SOLUTION) ×2 IMPLANT
PAD ARMBOARD 7.5X6 YLW CONV (MISCELLANEOUS) ×2 IMPLANT
POUCH SPECIMEN RETRIEVAL 10MM (ENDOMECHANICALS) IMPLANT
SCISSORS LAP 5X35 DISP (ENDOMECHANICALS) IMPLANT
SET CHOLANGIOGRAPH 5 50 .035 (SET/KITS/TRAYS/PACK) ×2 IMPLANT
SET IRRIG TUBING LAPAROSCOPIC (IRRIGATION / IRRIGATOR) ×2 IMPLANT
SPECIMEN JAR SMALL (MISCELLANEOUS) ×2 IMPLANT
SUT MON AB 5-0 PS2 18 (SUTURE) ×2 IMPLANT
SUT VICRYL 0 UR6 27IN ABS (SUTURE) ×2 IMPLANT
TOWEL OR 17X24 6PK STRL BLUE (TOWEL DISPOSABLE) ×2 IMPLANT
TOWEL OR 17X26 10 PK STRL BLUE (TOWEL DISPOSABLE) ×2 IMPLANT
TRAY LAPAROSCOPIC (CUSTOM PROCEDURE TRAY) ×2 IMPLANT
TROCAR XCEL BLUNT TIP 100MML (ENDOMECHANICALS) ×2 IMPLANT
TROCAR XCEL NON-BLD 5MMX100MML (ENDOMECHANICALS) ×2 IMPLANT
TROCAR Z-THREAD FIOS 11X100 BL (TROCAR) ×2 IMPLANT
TROCAR Z-THREAD FIOS 5X100MM (TROCAR) ×4 IMPLANT

## 2012-08-02 NOTE — Anesthesia Procedure Notes (Signed)
Procedure Name: Intubation Date/Time: 08/02/2012 3:29 PM Performed by: Tyrone Nine Pre-anesthesia Checklist: Patient identified, Timeout performed, Emergency Drugs available, Patient being monitored and Suction available Patient Re-evaluated:Patient Re-evaluated prior to inductionOxygen Delivery Method: Circle system utilized Preoxygenation: Pre-oxygenation with 100% oxygen Intubation Type: IV induction Ventilation: Mask ventilation without difficulty and Oral airway inserted - appropriate to patient size Laryngoscope Size: Mac and 3 Grade View: Grade I Tube size: 7.5 mm Number of attempts: 1 Airway Equipment and Method: Stylet and Oral airway Placement Confirmation: ETT inserted through vocal cords under direct vision,  breath sounds checked- equal and bilateral and positive ETCO2 Secured at: 23 cm Tube secured with: Tape Dental Injury: Teeth and Oropharynx as per pre-operative assessment

## 2012-08-02 NOTE — Anesthesia Preprocedure Evaluation (Addendum)
Anesthesia Evaluation  Patient identified by MRN, date of birth, ID band Patient awake  General Assessment Comment:No changes in history since recent surgery  Reviewed: Allergy & Precautions, H&P , NPO status , Patient's Chart, lab work & pertinent test results, reviewed documented beta blocker date and time   Airway Mallampati: II TM Distance: >3 FB Neck ROM: full    Dental  (+) Dental Advisory Given and Teeth Intact   Pulmonary sleep apnea ,    Pulmonary exam normal       Cardiovascular hypertension, On Medications and Pt. on medications negative cardio ROS  Rhythm:regular Rate:Normal     Neuro/Psych negative neurological ROS  negative psych ROS   GI/Hepatic negative GI ROS, Neg liver ROS, cholelithiasis   Endo/Other  diabetes, Well Controlled, Type 2, Oral Hypoglycemic AgentsMorbid obesity  Renal/GU negative Renal ROS     Musculoskeletal negative musculoskeletal ROS (+)   Abdominal   Peds  Hematology negative hematology ROS (+)   Anesthesia Other Findings See surgeon's H&P   Reproductive/Obstetrics negative OB ROS                         Anesthesia Physical Anesthesia Plan  ASA: II  Anesthesia Plan: General ETT   Post-op Pain Management:    Induction:   Airway Management Planned: Oral ETT  Additional Equipment:   Intra-op Plan:   Post-operative Plan: Extubation in OR  Informed Consent: I have reviewed the patients History and Physical, chart, labs and discussed the procedure including the risks, benefits and alternatives for the proposed anesthesia with the patient or authorized representative who has indicated his/her understanding and acceptance.   Dental Advisory Given  Plan Discussed with: CRNA and Anesthesiologist  Anesthesia Plan Comments:        Anesthesia Quick Evaluation

## 2012-08-02 NOTE — Progress Notes (Signed)
Clarified SSI and Q4H CBG's with Dr. Nancie Neas. No new orders received. Keep orders as is.

## 2012-08-02 NOTE — Progress Notes (Signed)
ANTIBIOTIC CONSULT NOTE - follow up  Pharmacy Consult for Zosyn Indication: Cholecystitis  Allergies  Allergen Reactions  . Aspirin Swelling    Patient Measurements: Height: 5\' 10"  (177.8 cm) Weight: 295 lb 1.6 oz (133.856 kg) IBW/kg (Calculated) : 73    Vital Signs: Temp: 97.5 F (36.4 C) (12/17 0615) Temp src: Oral (12/17 0615) BP: 136/72 mmHg (12/17 0615) Pulse Rate: 75  (12/17 0615) Intake/Output from previous day: 12/16 0701 - 12/17 0700 In: 0  Out: 600 [Urine:600] Intake/Output from this shift:    Labs:  Basename 08/02/12 0700 07/31/12 2302 07/31/12 0710 07/30/12 1553  WBC -- 6.5 5.4 5.3  HGB -- 14.1 13.8 15.2  PLT -- 163 163 PLATELET CLUMPS NOTED ON SMEAR, COUNT APPEARS ADEQUATE  LABCREA -- -- -- --  CREATININE 0.88 0.88 0.87 --   Estimated Creatinine Clearance: 130.7 ml/min (by C-G formula based on Cr of 0.88). No results found for this basename: VANCOTROUGH:2,VANCOPEAK:2,VANCORANDOM:2,GENTTROUGH:2,GENTPEAK:2,GENTRANDOM:2,TOBRATROUGH:2,TOBRAPEAK:2,TOBRARND:2,AMIKACINPEAK:2,AMIKACINTROU:2,AMIKACIN:2, in the last 72 hours   Microbiology: No results found for this or any previous visit (from the past 720 hour(s)).  Medical History: Past Medical History  Diagnosis Date  . Hypertension   . Sleep apnea     not using CPAP - could not tolerate  . Diabetes mellitus without complication   . History of kidney stones 2005  . Arthritis     Medications:  Amlodipine, losartan, metformin,zofran, percocet  Assessment: Mr.Glastetter was admitted with abdominal pain and was found to have cholelithiasis. He is on day # 4 of empiric zosyn.  Creat 0.88. Afebrile. No culture data.   Plan:  - continue Zosyn 3.375gm IV over 30 minutes now, then Zosyn 3.375gm IV Q8h, each dose infused over 4 hours  Herby Abraham, Pharm.D. [phone] 08/02/2012 12:01 PM

## 2012-08-02 NOTE — Preoperative (Signed)
Beta Blockers   Reason not to administer Beta Blockers:Not Applicable 

## 2012-08-02 NOTE — Transfer of Care (Signed)
Immediate Anesthesia Transfer of Care Note  Patient: Jeffery Higgins  Procedure(s) Performed: Procedure(s) (LRB) with comments: LAPAROSCOPIC CHOLECYSTECTOMY WITH INTRAOPERATIVE CHOLANGIOGRAM (N/A) - laparoscopic cholecystectomy with intraoperative cholangiogram  Patient Location: PACU  Anesthesia Type:General  Level of Consciousness: awake, alert , oriented and patient cooperative  Airway & Oxygen Therapy: Patient Spontanous Breathing and Patient connected to nasal cannula oxygen  Post-op Assessment: Report given to PACU RN and Post -op Vital signs reviewed and stable  Post vital signs: Reviewed and stable  Complications: No apparent anesthesia complications

## 2012-08-02 NOTE — Op Note (Signed)
Preoperative diagnosis: Cholelithiasis and cholecystitis  Postoperative diagnosis: Cholelithiasis and cholecystitis  Surgical procedure: Laparoscopic cholecystectomy with intraoperative cholangiogram  Surgeon: Sharlet Salina T. Deryl Ports M.D.  Assistant: Violeta Gelinas MD  Anesthesia: General Endotracheal  Complications: None  Estimated blood loss: Minimal  Description of procedure: The patient brought to the operating room, placed in the supine position on the operating table, and general endotracheal anesthesia induced. The abdomen was widely sterilely prepped and draped. The patient had received preoperative IV antibiotics and PAS were in place. Patient timeout was performed the correct procedure verified. Standard 4 port technique was used with an open Hassan cannula at a supraumbilical site due to obesity and the remainder of the ports placed under direct vision. The gallbladder was visualized. It appeared edemetous and somewhat thick walled.. The fundus was grasped and elevated up over the liver and the infundibulum retracted inferiolaterally. Peritoneum anterior and posterior to Calot's triangle was incised and fibrofatty tissue stripped off the neck of the gallbladder toward the porta hepatis. The distal gallbladder was thoroughly dissected. The cystic artery was identified in close triangle and the cystic duct gallbladder junction dissected 360.  A good critical view was obtained. When the anatomy was clear the cystic duct was clipped at the gallbladder junction and an operative cholangiogram obtained through the cystic duct. This showed good filling of a normal common bile duct and intrahepatic ducts with free flow into the duodenum and no filling defects. Following this the Cholangiocath was removed and the cystic duct was doubly clipped proximally and divided. The cystic artery was doubly clipped proximally and distally and divided. The gallbladder was dissected free from its bed using hook  cautery and removed through the umbilical port site. Complete hemostasis was obtained in the gallbladder bed. The right upper quadrant was thoroughly irrigated and hemostasis assured.  The supraumbilical suture was secured and then two additional sutures were placed with the Endoclose device. Trochars were removed and all CO2 evacuated and the [facility] trocar site fascial defect closed. Skin incisions were closed with subcuticular Monocryl and Dermabond. Sponge needle and instrument counts were correct. The patient was taken to PACU in good condition.  Manolo Bosket T  08/02/2012

## 2012-08-03 ENCOUNTER — Encounter (HOSPITAL_COMMUNITY): Payer: Self-pay | Admitting: General Surgery

## 2012-08-03 DIAGNOSIS — K8010 Calculus of gallbladder with chronic cholecystitis without obstruction: Secondary | ICD-10-CM | POA: Diagnosis present

## 2012-08-03 DIAGNOSIS — G4733 Obstructive sleep apnea (adult) (pediatric): Secondary | ICD-10-CM | POA: Diagnosis present

## 2012-08-03 DIAGNOSIS — I10 Essential (primary) hypertension: Secondary | ICD-10-CM

## 2012-08-03 DIAGNOSIS — E669 Obesity, unspecified: Secondary | ICD-10-CM

## 2012-08-03 DIAGNOSIS — E119 Type 2 diabetes mellitus without complications: Secondary | ICD-10-CM

## 2012-08-03 DIAGNOSIS — G4730 Sleep apnea, unspecified: Secondary | ICD-10-CM | POA: Diagnosis present

## 2012-08-03 DIAGNOSIS — E1169 Type 2 diabetes mellitus with other specified complication: Secondary | ICD-10-CM

## 2012-08-03 DIAGNOSIS — Z6841 Body Mass Index (BMI) 40.0 and over, adult: Secondary | ICD-10-CM

## 2012-08-03 HISTORY — DX: Type 2 diabetes mellitus with other specified complication: E11.69

## 2012-08-03 HISTORY — DX: Essential (primary) hypertension: I10

## 2012-08-03 HISTORY — DX: Calculus of gallbladder with chronic cholecystitis without obstruction: K80.10

## 2012-08-03 HISTORY — DX: Type 2 diabetes mellitus with other specified complication: E66.9

## 2012-08-03 HISTORY — DX: Obstructive sleep apnea (adult) (pediatric): G47.33

## 2012-08-03 HISTORY — DX: Body Mass Index (BMI) 40.0 and over, adult: Z684

## 2012-08-03 HISTORY — DX: Type 2 diabetes mellitus without complications: E11.9

## 2012-08-03 LAB — GLUCOSE, CAPILLARY
Glucose-Capillary: 110 mg/dL — ABNORMAL HIGH (ref 70–99)
Glucose-Capillary: 128 mg/dL — ABNORMAL HIGH (ref 70–99)
Glucose-Capillary: 148 mg/dL — ABNORMAL HIGH (ref 70–99)

## 2012-08-03 MED ORDER — ACETAMINOPHEN 325 MG PO TABS: 650.0000 mg | ORAL_TABLET | Freq: Four times a day (QID) | ORAL | Status: AC | PRN

## 2012-08-03 MED ORDER — METFORMIN HCL 500 MG PO TABS: 500.0000 mg | ORAL_TABLET | Freq: Every day | ORAL | Status: DC

## 2012-08-03 MED ORDER — OXYCODONE-ACETAMINOPHEN 5-325 MG PO TABS: 1.0000 | ORAL_TABLET | ORAL | Status: DC | PRN

## 2012-08-03 NOTE — Discharge Summary (Signed)
Physician Discharge Summary  Patient ID: Jeffery Higgins MRN: [id number] DOB/AGE: August 09, 1956 56 y.o.  Admit date: 07/30/2012 Discharge date: 08/03/2012  Admission Diagnoses: Cholelithiasis and cholecystitis, Hypertension  Sleep apnea  not using CPAP - could not tolerate  Diabetes mellitus without complication  History of kidney stones  Arthritis  Body mass index is 42.3   Discharge Diagnoses: Cholelithiasis and cholecystitis, s/p Laparoscopic cholecystectomy with intraoperative cholangiogram, 08/02/2012, [name], MD  Hypertension  Sleep apnea  not using CPAP - could not tolerate  Diabetes mellitus without complication  History of kidney stones  Arthritis  Body mass index is 42.3  Principal Problem:  *Cholecystitis with cholelithiasis Active Problems:  Diabetes mellitus type 2 in obese  Hypertension  Sleep apnea  BMI 40.0-44.9, adult   PROCEDURES: Laparoscopic cholecystectomy with intraoperative cholangiogram, 08/02/2012, [name], MD    Hospital Course: Patient was seen in ED yesterday evening with same symptoms and known gallstones. Actually he was diagnoses with gallstones about 20 years ago but surgery was not recommended. He had not had any severe attacks until yesterday when the pain was 10/10, but it improved to the point where he could go home with oral pain medications. His LFTs were abnormal yesterday and are worse today.  He was admitted and seen by GI Dr. Dorena Cookey, ultrasound showed a 4.63mm CBD, and elevated LFT's led to an MRCP which showed Gallstones with no evidence for choledocholithiasis.  He underwent cholecystectomy the next day, 08/02/12 and did well.  He is ready for discharge the 1st post op day. Follow up with primary for his medical issues, and more diabetes instruction. Follow up with DR Hoxworth or the [facility] in 2-3 weeks  Condition on D/C:  Improved  Disposition: 01-Home or Self Care     Medication List     As of  08/03/2012 11:23 AM    TAKE these medications         acetaminophen 325 MG tablet   Commonly known as: TYLENOL   Take 2 tablets (650 mg total) by mouth every 6 (six) hours as needed (or Temp > 100).      amLODipine 5 MG tablet   Commonly known as: NORVASC   Take 5 mg by mouth daily.      losartan 50 MG tablet   Commonly known as: COZAAR   Take 50 mg by mouth daily.      metFORMIN 500 MG tablet   Commonly known as: GLUCOPHAGE   Take 1 tablet (500 mg total) by mouth daily.      ondansetron 4 MG disintegrating tablet   Commonly known as: ZOFRAN-ODT   Take 1 tablet (4 mg total) by mouth every 8 (eight) hours as needed for nausea.      oxyCODONE-acetaminophen 5-325 MG per tablet   Commonly known as: PERCOCET/ROXICET   Take 1 tablet by mouth every 4 (four) hours as needed for pain.      oxyCODONE-acetaminophen 5-325 MG per tablet   Commonly known as: PERCOCET/ROXICET   Take 1-2 tablets by mouth every 4 (four) hours as needed.        Follow-up Information    Follow up with HOXWORTH,BENJAMIN T, MD. Schedule an appointment as soon as possible for a visit in 2 weeks. (Call for an appointment in 2-3 weeks with the [facility], or Dr. Johna Sheriff)    Contact information:   [address] [place] [address] [phone]       Follow up  with Pcp Not In System. (Call your primary care doctor and let him see you for medical issues.  Let him know about surgery)          Signed: Tuwanna Krausz 08/03/2012, 11:23 AM

## 2012-08-03 NOTE — Progress Notes (Signed)
1 Day Post-Op  Subjective: Feels better and wants to go home.  Long discussion on AODM.  Objective: Vital signs in last 24 hours: Temp:  [97.6 F (36.4 C)-98.5 F (36.9 C)] 98.1 F (36.7 C) (12/18 0950) Pulse Rate:  [63-98] 72  (12/18 0950) Resp:  [16-21] 16  (12/18 0950) BP: (130-176)/(70-103) 139/85 mmHg (12/18 0950) SpO2:  [87 %-98 %] 96 % (12/18 0950) Last BM Date: 07/30/12  Diet; Carb modified, afebrile, VSS, no labs, post op IOC shows no ductal stones  Intake/Output from previous day: 12/17 0701 - 12/18 0700 In: 2391.2 [I.V.:2391.2] Out: 950 [Urine:900; Blood:50] Intake/Output this shift:    General appearance: alert, cooperative and no distress GI: soft, eating regular diet, no complaints aside from soreness.  Lab Results:   [facility] 07/31/12 2302  WBC 6.5  HGB 14.1  HCT 42.3  PLT 163    BMET  Basename 08/02/12 0700 07/31/12 2302  NA 140 139  K 4.1 3.9  CL 104 103  CO2 25 24  GLUCOSE 106* 89  BUN 10 11  CREATININE 0.88 0.88  CALCIUM 9.1 9.1   PT/INR No results found for this basename: LABPROT:2,INR:2 in the last 72 hours   Lab 07/31/12 2302 07/31/12 0710 07/30/12 1553 07/30/12 0140  AST 135* 229* 390* 222*  ALT 323* 405* 484* 169*  ALKPHOS 68 71 74 66  BILITOT 2.4* 2.6* 2.7* 0.7  PROT 7.0 6.7 7.4 7.7  ALBUMIN 3.5 3.3* 3.7 3.9     Lipase     Component Value Date/Time   LIPASE 30 07/31/2012 0815     Studies/Results: Dg Cholangiogram Operative  08/02/2012  *RADIOLOGY REPORT*  Clinical Data:   Laparoscopic cholecystectomy with intraoperative cholangiogram  INTRAOPERATIVE CHOLANGIOGRAM  Technique:  Cholangiographic images from the C-arm fluoroscopic device were submitted for interpretation post-operatively.  Please see the procedural report for the amount of contrast and the fluoroscopy time utilized.  Comparison:  Abdomen MRI / MRCP,  Findings:  The intra and extrahepatic biliary tree are normal in caliber and smooth.  There are no filling  defects.  Contrast freely enters the duodenum.  IMPRESSION: Normal intraoperative cholangiogram.  No evidence of a duct stones.   Original Report Authenticated By: Amie Portland, M.D.     Medications:    . amLODipine  5 mg Oral Daily  . insulin aspart  0-9 Units Subcutaneous Q4H  . losartan  50 mg Oral Daily  . metFORMIN  500 mg Oral Q breakfast  . pantoprazole (PROTONIX) IV  40 mg Intravenous QHS    Assessment/Plan Cholelithiasis and cholecystitis, s/p Laparoscopic cholecystectomy with intraoperative cholangiogram, 08/02/2012, [name], MD Hypertension  Sleep apnea  not using CPAP - could not tolerate  Diabetes mellitus without complication  History of kidney stones  Arthritis  Body mass index is 42.3  Plan;  Home today, will add low carb diet info and ask him to talk with insurance and primary care for more info.      LOS: 4 days    Coco Sharpnack 08/03/2012

## 2012-08-03 NOTE — Progress Notes (Signed)
IV d/c'd.  Pt d/c'd to home.  Home meds and d/c instructions have been discussed with pt and pt family.  Both deny any questions or concerns at this time.  Pt leaving unit via wheelchair and appears in no acute distress. Nino Glow RN

## 2012-08-04 ENCOUNTER — Encounter (HOSPITAL_COMMUNITY): Payer: Self-pay | Admitting: General Surgery

## 2012-08-09 ENCOUNTER — Telehealth (INDEPENDENT_AMBULATORY_CARE_PROVIDER_SITE_OTHER): Payer: Self-pay

## 2012-08-09 NOTE — Telephone Encounter (Signed)
Message copied by Maryan Puls on Tue Aug 09, 2012 11:21 AM ------      Message from: Jeffery Higgins      Created: Mon Aug 08, 2012  3:03 PM       Pt had sx on 12/14 and needs a 2 -3 week po apt .You can call him in his home # ....thanks

## 2012-08-09 NOTE — Telephone Encounter (Signed)
Post op appointment made for 08/18/12 @ 4:20 pm with Dr. Johna Sheriff.

## 2012-08-15 ENCOUNTER — Encounter (HOSPITAL_BASED_OUTPATIENT_CLINIC_OR_DEPARTMENT_OTHER): Payer: Self-pay

## 2012-08-15 ENCOUNTER — Emergency Department (HOSPITAL_BASED_OUTPATIENT_CLINIC_OR_DEPARTMENT_OTHER)
Admission: EM | Admit: 2012-08-15 | Discharge: 2012-08-15 | Disposition: A | Discharge status: Home / Self Care | Payer: BC Managed Care – PPO | Attending: Emergency Medicine | Admitting: Emergency Medicine

## 2012-08-15 DIAGNOSIS — Z87442 Personal history of urinary calculi: Secondary | ICD-10-CM | POA: Insufficient documentation

## 2012-08-15 DIAGNOSIS — G4730 Sleep apnea, unspecified: Secondary | ICD-10-CM | POA: Insufficient documentation

## 2012-08-15 DIAGNOSIS — E669 Obesity, unspecified: Secondary | ICD-10-CM | POA: Insufficient documentation

## 2012-08-15 DIAGNOSIS — R112 Nausea with vomiting, unspecified: Secondary | ICD-10-CM | POA: Insufficient documentation

## 2012-08-15 DIAGNOSIS — R509 Fever, unspecified: Secondary | ICD-10-CM | POA: Insufficient documentation

## 2012-08-15 DIAGNOSIS — R059 Cough, unspecified: Secondary | ICD-10-CM | POA: Insufficient documentation

## 2012-08-15 DIAGNOSIS — E119 Type 2 diabetes mellitus without complications: Secondary | ICD-10-CM | POA: Insufficient documentation

## 2012-08-15 DIAGNOSIS — R5381 Other malaise: Secondary | ICD-10-CM | POA: Insufficient documentation

## 2012-08-15 DIAGNOSIS — R05 Cough: Secondary | ICD-10-CM | POA: Insufficient documentation

## 2012-08-15 DIAGNOSIS — Z8739 Personal history of other diseases of the musculoskeletal system and connective tissue: Secondary | ICD-10-CM | POA: Insufficient documentation

## 2012-08-15 DIAGNOSIS — E860 Dehydration: Secondary | ICD-10-CM | POA: Insufficient documentation

## 2012-08-15 DIAGNOSIS — J069 Acute upper respiratory infection, unspecified: Secondary | ICD-10-CM | POA: Insufficient documentation

## 2012-08-15 DIAGNOSIS — I10 Essential (primary) hypertension: Secondary | ICD-10-CM | POA: Insufficient documentation

## 2012-08-15 DIAGNOSIS — R51 Headache: Secondary | ICD-10-CM

## 2012-08-15 HISTORY — DX: Calculus of kidney: N20.0

## 2012-08-15 MED ORDER — ACETAMINOPHEN-CODEINE #3 300-30 MG PO TABS: 1.0000 | ORAL_TABLET | Freq: Four times a day (QID) | ORAL | Status: DC | PRN

## 2012-08-15 MED ORDER — METOCLOPRAMIDE HCL 5 MG/ML IJ SOLN
10.0000 mg | Freq: Once | INTRAMUSCULAR | Status: AC
  Administered 2012-08-15: 10 mg via INTRAVENOUS
  Filled 2012-08-15: qty 2

## 2012-08-15 MED ORDER — SODIUM CHLORIDE 0.9 % IV SOLN
Freq: Once | INTRAVENOUS | Status: AC
  Administered 2012-08-15: 17:00:00 via INTRAVENOUS

## 2012-08-15 MED ORDER — ACETAMINOPHEN 325 MG PO TABS
650.0000 mg | ORAL_TABLET | Freq: Once | ORAL | Status: AC
  Administered 2012-08-15: 650 mg via ORAL
  Filled 2012-08-15 (×2): qty 2

## 2012-08-15 MED ORDER — DIPHENHYDRAMINE HCL 50 MG/ML IJ SOLN
25.0000 mg | Freq: Once | INTRAMUSCULAR | Status: AC
  Administered 2012-08-15: 25 mg via INTRAVENOUS
  Filled 2012-08-15: qty 1

## 2012-08-15 MED ORDER — SODIUM CHLORIDE 0.9 % IV BOLUS (SEPSIS)
1000.0000 mL | INTRAVENOUS | Status: AC
  Administered 2012-08-15: 1000 mL via INTRAVENOUS

## 2012-08-15 MED ORDER — BENZONATATE 100 MG PO CAPS: 200.0000 mg | ORAL_CAPSULE | Freq: Two times a day (BID) | ORAL | Status: DC | PRN

## 2012-08-15 MED ORDER — KETOROLAC TROMETHAMINE 30 MG/ML IJ SOLN
30.0000 mg | Freq: Once | INTRAMUSCULAR | Status: AC
  Administered 2012-08-15: 30 mg via INTRAVENOUS
  Filled 2012-08-15: qty 1

## 2012-08-15 NOTE — ED Provider Notes (Signed)
History  This chart was scribed for Jeffery Chad, MD by [name], ED scribe. This patient was seen in room MH01/MH01 and the patient's care was started at 1524.   CSN: [id number]  Arrival date & time 08/15/12  1524   First MD Initiated Contact with Patient 08/15/12 1630      Chief Complaint  Patient presents with  . Headache   Patient is a 56 y.o. male presenting with headaches and cough. The history is provided by the patient. No language interpreter was used.  Headache  This is a new problem. The current episode started yesterday. The problem occurs constantly. The problem has not changed since onset.The headache is associated with bright light. The pain is moderate. The pain does not radiate. Associated symptoms include a fever, malaise/fatigue, nausea and vomiting. Pertinent negatives include no shortness of breath. He has tried nothing for the symptoms.  Cough This is a new problem. The current episode started yesterday. The problem occurs constantly. The problem has been gradually worsening. The cough is non-productive. The maximum temperature recorded prior to his arrival was 101 to 101.9 F. The fever has been present for 1 to 2 days. Associated symptoms include headaches. Pertinent negatives include no chest pain, no chills and no shortness of breath. He has tried nothing for the symptoms.   Jeffery Higgins is a 56 y.o. male who presents to the Emergency Department w/hx of migraines complaining of constant gradually worsening non-productive cough since yesterday with associated migraine today. He states has not taken any medicines at home for this problem. He states associated fever, body aches, photophobia, nausea and emesis last pm. He denies diarrhea, sore throat. He had a recent cholecystectomy and rotator cuff surgery at [facility] and without any complications pos surgery (he was d/c from hospital day after cholecystectomy). He takes Metformin for DM, he states sugars have been  running normal lately.   His PCP is [name]   Past Medical History  Diagnosis Date  . Hypertension   . Sleep apnea     not using CPAP - could not tolerate  . Diabetes mellitus without complication   . History of kidney stones 2005  . Arthritis   . Diabetes mellitus type 2 in obese 08/03/2012  . Hypertension 08/03/2012  . BMI 40.0-44.9, adult 08/03/2012  . Kidney stone     Past Surgical History  Procedure Date  . Lithotripsy   . Shoulder arthroscopy with subacromial decompression 07/07/2012    Procedure: SHOULDER ARTHROSCOPY WITH SUBACROMIAL DECOMPRESSION;  Surgeon: [name], MD;  Location: [facility] OR;  Service: Orthopedics;  Laterality: Right;  with distal clavicle resection  . Cholecystectomy 08/02/2012    Procedure: LAPAROSCOPIC CHOLECYSTECTOMY WITH INTRAOPERATIVE CHOLANGIOGRAM;  Surgeon: [name], MD;  Location: [facility] OR;  Service: General;  Laterality: N/A;  laparoscopic cholecystectomy with intraoperative cholangiogram    No family history on file.  History  Substance Use Topics  . Smoking status: Never Smoker   . Smokeless tobacco: Not on file  . Alcohol Use: Yes     Comment: occasional       Review of Systems  Constitutional: Positive for fever and malaise/fatigue. Negative for chills.  Respiratory: Positive for cough. Negative for shortness of breath.   Cardiovascular: Negative for chest pain.  Gastrointestinal: Positive for nausea and vomiting. Negative for abdominal pain.  Neurological: Positive for headaches. Negative for weakness.  All other systems reviewed and are negative.    Allergies  Aspirin  Home Medications  Current Outpatient Rx  Name  Route  Sig  Dispense  Refill  . ACETAMINOPHEN 325 MG PO TABS   Oral   Take 2 tablets (650 mg total) by mouth every 6 (six) hours as needed (or Temp > 100).         . AMLODIPINE BESYLATE 5 MG PO TABS   Oral   Take 5 mg by mouth daily.         [name] LOSARTAN POTASSIUM 50 MG PO TABS    Oral   Take 50 mg by mouth daily.         [name] METFORMIN HCL 500 MG PO TABS   Oral   Take 1 tablet (500 mg total) by mouth daily.           Restart that in 48 hours, Friday 08/05/12   . ONDANSETRON 4 MG PO TBDP   Oral   Take 1 tablet (4 mg total) by mouth every 8 (eight) hours as needed for nausea.   10 tablet   0   . OXYCODONE-ACETAMINOPHEN 5-325 MG PO TABS   Oral   Take 1 tablet by mouth every 4 (four) hours as needed for pain.   20 tablet   0   . OXYCODONE-ACETAMINOPHEN 5-325 MG PO TABS   Oral   Take 1-2 tablets by mouth every 4 (four) hours as needed.   40 tablet   0     Triage Vitals: BP 155/90  Pulse 113  Temp 102.7 F (39.3 C) (Oral)  Resp 18  SpO2 95%  Physical Exam  Nursing note and vitals reviewed. Constitutional: He is oriented to person, place, and time. He appears well-developed and well-nourished. No distress.  HENT:  Head: Normocephalic and atraumatic. No trismus in the jaw.  Right Ear: Hearing, tympanic membrane, external ear and ear canal normal.  Left Ear: Hearing, tympanic membrane, external ear and ear canal normal.  Nose: Rhinorrhea present. Right sinus exhibits no maxillary sinus tenderness and no frontal sinus tenderness.  Mouth/Throat: Uvula is midline. Mucous membranes are not pale, dry and not cyanotic. No uvula swelling. No oropharyngeal exudate, posterior oropharyngeal edema, posterior oropharyngeal erythema or tonsillar abscesses.       Dry mucous membranes.  + rhinorrhea.  Eyes: Conjunctivae normal and EOM are normal. Pupils are equal, round, and reactive to light. Right eye exhibits no discharge. Left eye exhibits no discharge. No scleral icterus.  Neck: Full passive range of motion without pain. Neck supple. No JVD present. No spinous process tenderness and no muscular tenderness present. No rigidity. No tracheal deviation, no edema, no erythema and normal range of motion present.       No nuchal rigidity.  Intact passive and active ROM  without exacerbation of headache.  Cardiovascular: Normal rate, regular rhythm, normal heart sounds and intact distal pulses.  Exam reveals no gallop and no friction rub.   No murmur heard. Pulmonary/Chest: Effort normal and breath sounds normal. No stridor. No respiratory distress. He has no wheezes. He has no rales. He exhibits no tenderness.  Abdominal: Soft. Bowel sounds are normal. He exhibits no distension and no mass. There is no tenderness. There is no rebound and no guarding.       Obese, protuberant.  Musculoskeletal: Normal range of motion. He exhibits no edema and no tenderness.  Lymphadenopathy:    He has no cervical adenopathy.  Neurological: He is alert and oriented to person, place, and time. No cranial nerve deficit.  Skin: Skin is warm and  dry. No rash noted. No erythema. No pallor.  Psychiatric: He has a normal mood and affect. His behavior is normal.    ED Course  Procedures (including critical care time) DIAGNOSTIC STUDIES: Oxygen Saturation is 97% on room air, normal by my interpretation.    COORDINATION OF CARE: At 435 PM Discussed treatment plan with patient which includes IV fluids, tylenol, reglan, benadryl. Patient agrees.   Labs Reviewed - No data to display No results found.   No diagnosis found.    MDM   Date: 08/15/2012  Rate: 118 bpm  Rhythm: sinus tachycardia  QRS Axis: left  Intervals: normal  ST/T Wave abnormalities: nonspecific ST changes  Conduction Disutrbances:incomplete RBBB  Narrative Interpretation:   Old EKG Reviewed: rate  1855.  Pt stable, NAD.  HA is completely resolved.  He continues to have a nonproductive cough.  He has an exam consistent with a viral syndrome.  Will prescribe tessalon and T#3 for symptomatic care.  Encouraged close outpt f/u.   I personally performed the services described in this documentation, which was scribed in my presence. The recorded information has been reviewed and is  accurate.          Jeffery Chad, MD 08/15/12 [phone]

## 2012-08-15 NOTE — ED Notes (Signed)
Male with pt to triage-states pt is c/o increased SOB-pt seated in w/c in lobby-with no change noted from triage-C Mabe RT asked to assess pt for possible resp need

## 2012-08-15 NOTE — ED Notes (Signed)
Pt awakened and he reports feeling better. Family at bedside and informed of plan of care.

## 2012-08-15 NOTE — ED Notes (Addendum)
C/o HA and elevated BP "167/98 or 100"-also c/o "coughing all night" "feel real sore all over"

## 2012-08-15 NOTE — ED Notes (Signed)
MD at bedside. 

## 2012-08-16 NOTE — Anesthesia Postprocedure Evaluation (Signed)
  Anesthesia Post-op Note  Patient: Jeffery Higgins  Procedure(s) Performed: Procedure(s) (LRB) with comments: LAPAROSCOPIC CHOLECYSTECTOMY WITH INTRAOPERATIVE CHOLANGIOGRAM (N/A) - laparoscopic cholecystectomy with intraoperative cholangiogram  Patient Location: PACU  Anesthesia Type:General  Level of Consciousness: awake  Airway and Oxygen Therapy: Patient Spontanous Breathing  Post-op Pain: mild  Post-op Assessment: Post-op Vital signs reviewed  Post-op Vital Signs: stable  Complications: No apparent anesthesia complications

## 2012-08-18 ENCOUNTER — Encounter (INDEPENDENT_AMBULATORY_CARE_PROVIDER_SITE_OTHER): Payer: BC Managed Care – PPO | Admitting: General Surgery

## 2012-09-02 ENCOUNTER — Encounter (INDEPENDENT_AMBULATORY_CARE_PROVIDER_SITE_OTHER): Payer: BC Managed Care – PPO | Admitting: General Surgery

## 2012-10-26 ENCOUNTER — Encounter (INDEPENDENT_AMBULATORY_CARE_PROVIDER_SITE_OTHER): Payer: BC Managed Care – PPO | Admitting: General Surgery

## 2013-01-21 DIAGNOSIS — E291 Testicular hypofunction: Secondary | ICD-10-CM | POA: Insufficient documentation

## 2013-01-21 DIAGNOSIS — L247 Irritant contact dermatitis due to plants, except food: Secondary | ICD-10-CM | POA: Insufficient documentation

## 2013-01-21 DIAGNOSIS — H9319 Tinnitus, unspecified ear: Secondary | ICD-10-CM | POA: Insufficient documentation

## 2015-06-25 ENCOUNTER — Emergency Department (HOSPITAL_BASED_OUTPATIENT_CLINIC_OR_DEPARTMENT_OTHER): Payer: BLUE CROSS/BLUE SHIELD

## 2015-06-25 ENCOUNTER — Encounter (HOSPITAL_BASED_OUTPATIENT_CLINIC_OR_DEPARTMENT_OTHER): Payer: Self-pay

## 2015-06-25 ENCOUNTER — Emergency Department (HOSPITAL_BASED_OUTPATIENT_CLINIC_OR_DEPARTMENT_OTHER)
Admission: EM | Admit: 2015-06-25 | Discharge: 2015-06-25 | Disposition: A | Discharge status: Home / Self Care | Payer: BLUE CROSS/BLUE SHIELD | Attending: Emergency Medicine | Admitting: Emergency Medicine

## 2015-06-25 DIAGNOSIS — M1990 Unspecified osteoarthritis, unspecified site: Secondary | ICD-10-CM | POA: Insufficient documentation

## 2015-06-25 DIAGNOSIS — Z79899 Other long term (current) drug therapy: Secondary | ICD-10-CM | POA: Diagnosis not present

## 2015-06-25 DIAGNOSIS — R109 Unspecified abdominal pain: Secondary | ICD-10-CM | POA: Diagnosis present

## 2015-06-25 DIAGNOSIS — Z792 Long term (current) use of antibiotics: Secondary | ICD-10-CM | POA: Diagnosis not present

## 2015-06-25 DIAGNOSIS — E669 Obesity, unspecified: Secondary | ICD-10-CM | POA: Diagnosis not present

## 2015-06-25 DIAGNOSIS — E119 Type 2 diabetes mellitus without complications: Secondary | ICD-10-CM | POA: Diagnosis not present

## 2015-06-25 DIAGNOSIS — I10 Essential (primary) hypertension: Secondary | ICD-10-CM | POA: Insufficient documentation

## 2015-06-25 DIAGNOSIS — Z7984 Long term (current) use of oral hypoglycemic drugs: Secondary | ICD-10-CM | POA: Insufficient documentation

## 2015-06-25 DIAGNOSIS — N201 Calculus of ureter: Secondary | ICD-10-CM | POA: Insufficient documentation

## 2015-06-25 DIAGNOSIS — Z8669 Personal history of other diseases of the nervous system and sense organs: Secondary | ICD-10-CM | POA: Diagnosis not present

## 2015-06-25 LAB — URINE MICROSCOPIC-ADD ON

## 2015-06-25 LAB — URINALYSIS, ROUTINE W REFLEX MICROSCOPIC
Bilirubin Urine: NEGATIVE
GLUCOSE, UA: NEGATIVE mg/dL
KETONES UR: NEGATIVE mg/dL
Leukocytes, UA: NEGATIVE
Nitrite: NEGATIVE
PROTEIN: NEGATIVE mg/dL
Specific Gravity, Urine: 1.022 (ref 1.005–1.030)
Urobilinogen, UA: 0.2 mg/dL (ref 0.0–1.0)
pH: 5 (ref 5.0–8.0)

## 2015-06-25 MED ORDER — SODIUM CHLORIDE 0.9 % IV SOLN
INTRAVENOUS | Status: DC
  Administered 2015-06-25: 02:00:00 via INTRAVENOUS

## 2015-06-25 MED ORDER — HYDROMORPHONE HCL 4 MG PO TABS: 4.0000 mg | ORAL_TABLET | ORAL | Status: DC | PRN

## 2015-06-25 MED ORDER — HYDROMORPHONE HCL 1 MG/ML IJ SOLN
1.0000 mg | Freq: Once | INTRAMUSCULAR | Status: DC | PRN
Start: 2015-06-25 — End: 2015-06-25
  Administered 2015-06-25 (×2): 1 mg via INTRAVENOUS
  Filled 2015-06-25 (×2): qty 1

## 2015-06-25 MED ORDER — KETOROLAC TROMETHAMINE 15 MG/ML IJ SOLN
15.0000 mg | Freq: Once | INTRAMUSCULAR | Status: AC
  Administered 2015-06-25: 15 mg via INTRAVENOUS
  Filled 2015-06-25: qty 1

## 2015-06-25 MED ORDER — ONDANSETRON 8 MG PO TBDP: 8.0000 mg | ORAL_TABLET | Freq: Three times a day (TID) | ORAL | Status: DC | PRN

## 2015-06-25 MED ORDER — ONDANSETRON HCL 4 MG/2ML IJ SOLN
4.0000 mg | Freq: Once | INTRAMUSCULAR | Status: AC
  Administered 2015-06-25: 4 mg via INTRAVENOUS
  Filled 2015-06-25: qty 2

## 2015-06-25 MED ORDER — HYDROMORPHONE HCL 1 MG/ML IJ SOLN
1.0000 mg | Freq: Once | INTRAMUSCULAR | Status: AC
  Administered 2015-06-25: 1 mg via INTRAVENOUS
  Filled 2015-06-25: qty 1

## 2015-06-25 MED ORDER — TAMSULOSIN HCL 0.4 MG PO CAPS
0.4000 mg | ORAL_CAPSULE | Freq: Once | ORAL | Status: AC
  Administered 2015-06-25: 0.4 mg via ORAL
  Filled 2015-06-25: qty 1

## 2015-06-25 MED ORDER — TAMSULOSIN HCL 0.4 MG PO CAPS: ORAL_CAPSULE | ORAL | Status: DC

## 2015-06-25 NOTE — ED Notes (Signed)
Pt c/o lt flank pain radiating to groin x1hr with n/v

## 2015-06-25 NOTE — ED Provider Notes (Signed)
CSN: [id number]     Arrival date & time 06/25/15  0129 History   First MD Initiated Contact with Patient 06/25/15 0139     Chief Complaint  Patient presents with  . Flank Pain     (Consider location/radiation/quality/duration/timing/severity/associated sxs/prior Treatment) HPI  This is a 59 year old male with a history of ureteral colic. He recently had a left inguinal herniorrhaphy without significant complication. He is here with left flank pain radiating to his left groin that began about one hour ago. The onset was sudden. He characterizes the pain is severe and like previous kidney stones. There is little change in pain with movement. He has had nausea and vomiting. He has not noticed blood in his urine.  Past Medical History  Diagnosis Date  . Hypertension   . Sleep apnea     not using CPAP - could not tolerate  . Diabetes mellitus without complication (HCC)   . History of kidney stones 2005  . Arthritis   . Diabetes mellitus type 2 in obese (HCC) 08/03/2012  . Hypertension 08/03/2012  . BMI 40.0-44.9, adult (HCC) 08/03/2012  . Kidney stone    Past Surgical History  Procedure Laterality Date  . Lithotripsy    . Shoulder arthroscopy with subacromial decompression  07/07/2012    Procedure: SHOULDER ARTHROSCOPY WITH SUBACROMIAL DECOMPRESSION;  Surgeon: [name], MD;  Location: [facility] OR;  Service: Orthopedics;  Laterality: Right;  with distal clavicle resection  . Cholecystectomy  08/02/2012    Procedure: LAPAROSCOPIC CHOLECYSTECTOMY WITH INTRAOPERATIVE CHOLANGIOGRAM;  Surgeon: [name], MD;  Location: [facility] OR;  Service: General;  Laterality: N/A;  laparoscopic cholecystectomy with intraoperative cholangiogram   No family history on file. Social History  Substance Use Topics  . Smoking status: Never Smoker   . Smokeless tobacco: None  . Alcohol Use: Yes     Comment: occasional     Review of Systems  All other systems reviewed and are negative.   Allergies   Aspirin  Home Medications   Prior to Admission medications   Medication Sig Start Date End Date Taking? Authorizing Provider  doxycycline (VIBRA-TABS) 100 MG tablet Take 100 mg by mouth 2 (two) times daily.   Yes Historical Provider, MD  oxyCODONE (OXY IR/ROXICODONE) 5 MG immediate release tablet Take 5 mg by mouth every 4 (four) hours as needed for severe pain.   Yes Historical Provider, MD  salmeterol (SEREVENT) 50 MCG/DOSE diskus inhaler Inhale 1 puff into the lungs 2 (two) times daily.   Yes Historical Provider, MD  acetaminophen (TYLENOL) 325 MG tablet Take 2 tablets (650 mg total) by mouth every 6 (six) hours as needed (or Temp > 100). 08/03/12   Sherrie George, PA-C  acetaminophen-codeine (TYLENOL #3) 300-30 MG per tablet Take 1-2 tablets by mouth every 6 (six) hours as needed for pain. 08/15/12   Tobin Chad, MD  amLODipine (NORVASC) 5 MG tablet Take 5 mg by mouth daily.    Historical Provider, MD  benzonatate (TESSALON) 100 MG capsule Take 2 capsules (200 mg total) by mouth 2 (two) times daily as needed for cough. 08/15/12   Tobin Chad, MD  losartan (COZAAR) 50 MG tablet Take 50 mg by mouth daily.    Historical Provider, MD  metFORMIN (GLUCOPHAGE) 500 MG tablet Take 1 tablet (500 mg total) by mouth daily. 08/03/12   Sherrie George, PA-C  ondansetron (ZOFRAN ODT) 4 MG disintegrating tablet Take 1 tablet (4 mg total) by mouth every 8 (eight) hours as needed  for nausea. 07/30/12   Eber Hong, MD  oxyCODONE-acetaminophen (PERCOCET) 5-325 MG per tablet Take 1 tablet by mouth every 4 (four) hours as needed for pain. 07/30/12   Eber Hong, MD  oxyCODONE-acetaminophen (PERCOCET/ROXICET) 5-325 MG per tablet Take 1-2 tablets by mouth every 4 (four) hours as needed. 08/03/12   Sherrie George, PA-C   BP 174/120 mmHg  Pulse 78  Temp(Src) 98.2 F (36.8 C) (Oral)  Resp 24  Ht  (1.778 m)  Wt 280 lb (127.007 kg)  BMI 40.18 kg/m2  SpO2 99%   Physical Exam  General:  Well-developed, well-nourished male in no acute distress; appearance consistent with age of record; appears uncomfortable HENT: normocephalic; atraumatic Eyes: pupils equal, round and reactive to light; extraocular muscles intact Neck: supple Heart: regular rate and rhythm Lungs: clear to auscultation bilaterally Abdomen: soft; nondistended; mild left lower quadrant tenderness; no masses or hepatosplenomegaly; bowel sounds present GU: Mild left CVA tenderness Extremities: No deformity; full range of motion; pulses normal Neurologic: Awake, alert and oriented; motor function intact in all extremities and symmetric; no facial droop Skin: Warm and dry Psychiatric: Mildly agitated    ED Course  Procedures (including critical care time)   MDM  Nursing notes and vitals signs, including pulse oximetry, reviewed.  Summary of this visit's results, reviewed by myself:  Labs:  Results for orders placed or performed during the hospital encounter of 06/25/15 (from the past 24 hour(s))  Urinalysis, Routine w reflex microscopic (not at [facility])     Status: Abnormal   Collection Time: 06/25/15  1:46 AM  Result Value Ref Range   Color, Urine YELLOW YELLOW   APPearance CLEAR CLEAR   Specific Gravity, Urine 1.022 1.005 - 1.030   pH 5.0 5.0 - 8.0   Glucose, UA NEGATIVE NEGATIVE mg/dL   Hgb urine dipstick SMALL (A) NEGATIVE   Bilirubin Urine NEGATIVE NEGATIVE   Ketones, ur NEGATIVE NEGATIVE mg/dL   Protein, ur NEGATIVE NEGATIVE mg/dL   Urobilinogen, UA 0.2 0.0 - 1.0 mg/dL   Nitrite NEGATIVE NEGATIVE   Leukocytes, UA NEGATIVE NEGATIVE  Urine microscopic-add on     Status: Abnormal   Collection Time: 06/25/15  1:46 AM  Result Value Ref Range   Squamous Epithelial / LPF RARE RARE   WBC, UA 0-2 <3 WBC/hpf   RBC / HPF 3-6 <3 RBC/hpf   Bacteria, UA FEW (A) RARE   Casts HYALINE CASTS (A) NEGATIVE   Urine-Other MUCOUS PRESENT     Imaging Studies: Ct Renal Stone Study  06/25/2015  CLINICAL DATA:   Left flank and lower quadrant/ inguinal pain for 2 hours. History of kidney stones EXAM: CT ABDOMEN AND PELVIS WITHOUT CONTRAST TECHNIQUE: Multidetector CT imaging of the abdomen and pelvis was performed following the standard protocol without IV contrast. COMPARISON:  No CT comparison available FINDINGS: Lower chest and abdominal wall: Abdominal wall scarring and probable medication injection sites. Fatty expansion of the left inguinal canal, a partially visualized hernia. Coronary atherosclerosis. Low lung volumes with right basilar atelectasis. Hepatobiliary: No focal hepatic lesion.Cholecystectomy. Normal common bile duct diameter. Pancreas: Unremarkable. Spleen: Unremarkable. Adrenals/Urinary Tract:  Negative adrenals. 5 mm stone at the left ureteral vesicular junction with mild hydroureteronephrosis and asymmetric perinephric edema. Bilateral nephrolithiasis, with stone burden greater on the left where there are least 5 calculi, measuring up to 4 mm. No right hydronephrosis or ureteral calculus. Unremarkable bladder. Reproductive:No pathologic findings. Stomach/Bowel: No obstruction. No appendicitis. Minimal sigmoid diverticulosis. Vascular/Lymphatic: No acute vascular abnormality. No mass  or adenopathy. Peritoneal: No ascites or pneumoperitoneum. Musculoskeletal: No acute abnormalities. Notable L4-5 facet arthropathy with grade 1 anterolisthesis. IMPRESSION: 1. 5 mm stone at the left UVJ with mild hydroureteronephrosis. 2. Left more than right renal calculi. 3. Right basilar atelectasis. 4. Coronary atherosclerosis. Electronically Signed   By: Marnee SpringJonathon  Watts M.D.   On: 06/25/2015 02:43   2:48 AM Pain well controlled.   Final diagnoses:  Left flank pain  Ureterolithiasis      Paula LibraJohn Phenix Vandermeulen, MD 06/25/15 [phone]

## 2015-12-05 DIAGNOSIS — L255 Unspecified contact dermatitis due to plants, except food: Secondary | ICD-10-CM | POA: Insufficient documentation

## 2017-10-20 DIAGNOSIS — M545 Low back pain, unspecified: Secondary | ICD-10-CM | POA: Insufficient documentation

## 2017-10-20 DIAGNOSIS — K760 Fatty (change of) liver, not elsewhere classified: Secondary | ICD-10-CM | POA: Insufficient documentation

## 2017-10-20 DIAGNOSIS — N529 Male erectile dysfunction, unspecified: Secondary | ICD-10-CM | POA: Insufficient documentation

## 2018-11-14 DIAGNOSIS — J45909 Unspecified asthma, uncomplicated: Secondary | ICD-10-CM | POA: Insufficient documentation

## 2021-06-19 ENCOUNTER — Emergency Department (HOSPITAL_BASED_OUTPATIENT_CLINIC_OR_DEPARTMENT_OTHER): Payer: BC Managed Care – PPO

## 2021-06-19 ENCOUNTER — Other Ambulatory Visit: Payer: Self-pay

## 2021-06-19 ENCOUNTER — Emergency Department (HOSPITAL_BASED_OUTPATIENT_CLINIC_OR_DEPARTMENT_OTHER)
Admission: EM | Admit: 2021-06-19 | Discharge: 2021-06-19 | Disposition: A | Discharge status: Home / Self Care | Payer: BC Managed Care – PPO | Attending: Emergency Medicine | Admitting: Emergency Medicine

## 2021-06-19 ENCOUNTER — Encounter (HOSPITAL_BASED_OUTPATIENT_CLINIC_OR_DEPARTMENT_OTHER): Payer: Self-pay

## 2021-06-19 DIAGNOSIS — Z20822 Contact with and (suspected) exposure to covid-19: Secondary | ICD-10-CM | POA: Diagnosis not present

## 2021-06-19 DIAGNOSIS — E119 Type 2 diabetes mellitus without complications: Secondary | ICD-10-CM | POA: Insufficient documentation

## 2021-06-19 DIAGNOSIS — Z79899 Other long term (current) drug therapy: Secondary | ICD-10-CM | POA: Insufficient documentation

## 2021-06-19 DIAGNOSIS — I10 Essential (primary) hypertension: Secondary | ICD-10-CM | POA: Diagnosis not present

## 2021-06-19 DIAGNOSIS — Z7984 Long term (current) use of oral hypoglycemic drugs: Secondary | ICD-10-CM | POA: Diagnosis not present

## 2021-06-19 DIAGNOSIS — R0602 Shortness of breath: Secondary | ICD-10-CM | POA: Diagnosis present

## 2021-06-19 DIAGNOSIS — J45901 Unspecified asthma with (acute) exacerbation: Secondary | ICD-10-CM | POA: Insufficient documentation

## 2021-06-19 HISTORY — DX: Unspecified asthma, uncomplicated: J45.909

## 2021-06-19 LAB — COMPREHENSIVE METABOLIC PANEL
ALT: 80 U/L — ABNORMAL HIGH (ref 0–44)
AST: 55 U/L — ABNORMAL HIGH (ref 15–41)
Albumin: 4 g/dL (ref 3.5–5.0)
Alkaline Phosphatase: 64 U/L (ref 38–126)
Anion gap: 7 (ref 5–15)
BUN: 13 mg/dL (ref 8–23)
CO2: 27 mmol/L (ref 22–32)
Calcium: 9.2 mg/dL (ref 8.9–10.3)
Chloride: 105 mmol/L (ref 98–111)
Creatinine, Ser: 0.7 mg/dL (ref 0.61–1.24)
GFR, Estimated: >60 mL/min (ref >60)
Glucose, Bld: 211 mg/dL — ABNORMAL HIGH (ref 70–99)
Potassium: 4 mmol/L (ref 3.5–5.1)
Sodium: 139 mmol/L (ref 135–145)
Total Bilirubin: 0.4 mg/dL (ref 0.3–1.2)
Total Protein: 7.9 g/dL (ref 6.5–8.1)

## 2021-06-19 LAB — CBC WITH DIFFERENTIAL/PLATELET
Abs Immature Granulocytes: 0.02 10*3/uL (ref 0.00–0.07)
Basophils Absolute: 0.1 10*3/uL (ref 0.0–0.1)
Basophils Relative: 1 %
Eosinophils Absolute: 0.5 10*3/uL (ref 0.0–0.5)
Eosinophils Relative: 7 %
HCT: 43.9 % (ref 39.0–52.0)
Hemoglobin: 14.3 g/dL (ref 13.0–17.0)
Immature Granulocytes: 0 %
Lymphocytes Relative: 29 %
Lymphs Abs: 2 10*3/uL (ref 0.7–4.0)
MCH: 29.2 pg (ref 26.0–34.0)
MCHC: 32.6 g/dL (ref 30.0–36.0)
MCV: 89.6 fL (ref 80.0–100.0)
Monocytes Absolute: 0.6 10*3/uL (ref 0.1–1.0)
Monocytes Relative: 8 %
Neutro Abs: 3.9 10*3/uL (ref 1.7–7.7)
Neutrophils Relative %: 55 %
Platelets: 174 10*3/uL (ref 150–400)
RBC: 4.9 MIL/uL (ref 4.22–5.81)
RDW: 13.9 % (ref 11.5–15.5)
WBC: 7.1 10*3/uL (ref 4.0–10.5)
nRBC: 0 % (ref 0.0–0.2)

## 2021-06-19 LAB — BRAIN NATRIURETIC PEPTIDE: B Natriuretic Peptide: 76.4 pg/mL (ref 0.0–100.0)

## 2021-06-19 LAB — TROPONIN I (HIGH SENSITIVITY): Troponin I (High Sensitivity): 10 ng/L (ref <18)

## 2021-06-19 LAB — RESP PANEL BY RT-PCR (FLU A&B, COVID) ARPGX2
Influenza A by PCR: NEGATIVE
Influenza B by PCR: NEGATIVE
SARS Coronavirus 2 by RT PCR: NEGATIVE

## 2021-06-19 MED ORDER — ALBUTEROL (5 MG/ML) CONTINUOUS INHALATION SOLN
10.0000 mg/h | INHALATION_SOLUTION | RESPIRATORY_TRACT | Status: DC
  Administered 2021-06-19: 10 mg/h via RESPIRATORY_TRACT
  Filled 2021-06-19: qty 20

## 2021-06-19 MED ORDER — METHYLPREDNISOLONE SODIUM SUCC 125 MG IJ SOLR
125.0000 mg | Freq: Once | INTRAMUSCULAR | Status: AC
  Administered 2021-06-19: 125 mg via INTRAVENOUS
  Filled 2021-06-19: qty 2

## 2021-06-19 MED ORDER — IPRATROPIUM-ALBUTEROL 0.5-2.5 (3) MG/3ML IN SOLN
3.0000 mL | RESPIRATORY_TRACT | Status: DC
  Administered 2021-06-19: 3 mL via RESPIRATORY_TRACT
  Filled 2021-06-19: qty 3

## 2021-06-19 MED ORDER — BENZONATATE 200 MG PO CAPS: 200.0000 mg | ORAL_CAPSULE | Freq: Three times a day (TID) | ORAL | 0 refills | Status: DC

## 2021-06-19 MED ORDER — ALBUTEROL SULFATE HFA 108 (90 BASE) MCG/ACT IN AERS
2.0000 | INHALATION_SPRAY | RESPIRATORY_TRACT | Status: DC | PRN
  Administered 2021-06-19: 2 via RESPIRATORY_TRACT

## 2021-06-19 MED ORDER — IPRATROPIUM-ALBUTEROL 0.5-2.5 (3) MG/3ML IN SOLN: 3.0000 mL | RESPIRATORY_TRACT | 0 refills | Status: DC | PRN

## 2021-06-19 MED ORDER — IOHEXOL 350 MG/ML SOLN
100.0000 mL | Freq: Once | INTRAVENOUS | Status: AC | PRN
  Administered 2021-06-19: 100 mL via INTRAVENOUS

## 2021-06-19 MED ORDER — ALBUTEROL SULFATE HFA 108 (90 BASE) MCG/ACT IN AERS
INHALATION_SPRAY | RESPIRATORY_TRACT | Status: AC
  Administered 2021-06-19: 4 via RESPIRATORY_TRACT
  Filled 2021-06-19: qty 6.7

## 2021-06-19 MED ORDER — PREDNISONE 20 MG PO TABS: 60.0000 mg | ORAL_TABLET | Freq: Every day | ORAL | 0 refills | Status: AC

## 2021-06-19 NOTE — ED Triage Notes (Signed)
Pt c/o SOB, cough, and cough x 1 week.  Pain score 7/10.  Pt reports using nebulizer this morning w/ minimal relief.    Pt reports recent travel to [place].

## 2021-06-19 NOTE — Progress Notes (Signed)
Patient ambulated to 2nd time. This time I walked him an entire circle in the department. Able to talk to me during walk. Got slightly SOB at the end with a SAT of 90%, but able to recover quickly once sitting.PA to speak with patient again

## 2021-06-19 NOTE — ED Provider Notes (Signed)
[facility] EMERGENCY DEPARTMENT Provider Note   CSN: [id number] Arrival date & time: 06/19/21  6553     History Chief Complaint  Patient presents with   Shortness of Breath    Jeffery Higgins is a 65 y.o. male.  Jeffery Higgins is a 65 y.o. male with hx of HTN, asthma, sleep apnea, DM, and kidney stones, who presents for evaluation of shortness of breath and chest pain. Pt reports worsening shortness of breath of the past 4 days, but significant worsening in the last 24 hours while he was traveling home after vacation in the [place]. Pt reports 3 weeks ago he was dealing with an episode of bronchitis and his PCP treated him with ABX and  cough medications just before his vacation. He reports towards the end of his trip he started having worsening cough again with shortness of breath. Shortness of breath is worse with any activity and pt reports he gets out of breath walking in his house. SOB also worse when laying flat. His wife reports she can hear him wheezing. He reports some associated chest pain and tightness. No lower extremity swelling or tenderness. Has had a productive cough, but no fevers. No known sick contacts. No abd pain, nausea, vomiting or diarrhea. Tried nebulizer treatment at home without relief, but reports solution is a few years old.  The history is provided by the patient.      Past Medical History:  Diagnosis Date   Arthritis    Asthma    BMI 40.0-44.9, adult (HCC) 08/03/2012   Diabetes mellitus type 2 in obese (HCC) 08/03/2012   Diabetes mellitus without complication (HCC)    History of kidney stones 08/18/2003   Hypertension    Hypertension 08/03/2012   Kidney stone    Sleep apnea    not using CPAP - could not tolerate    Patient Active Problem List   Diagnosis Date Noted   Cholecystitis with cholelithiasis 08/03/2012   Diabetes mellitus type 2 in obese (HCC) 08/03/2012   Hypertension 08/03/2012   Sleep apnea 08/03/2012   BMI  40.0-44.9, adult (HCC) 08/03/2012    Past Surgical History:  Procedure Laterality Date   CHOLECYSTECTOMY  08/02/2012   Procedure: LAPAROSCOPIC CHOLECYSTECTOMY WITH INTRAOPERATIVE CHOLANGIOGRAM;  Surgeon: [name], MD;  Location: [facility] OR;  Service: General;  Laterality: N/A;  laparoscopic cholecystectomy with intraoperative cholangiogram   KNEE ARTHROSCOPY     LITHOTRIPSY     SHOULDER ARTHROSCOPY WITH SUBACROMIAL DECOMPRESSION  07/07/2012   Procedure: SHOULDER ARTHROSCOPY WITH SUBACROMIAL DECOMPRESSION;  Surgeon: [name], MD;  Location: [facility] OR;  Service: Orthopedics;  Laterality: Right;  with distal clavicle resection       No family history on file.  Social History   Tobacco Use   Smoking status: Never   Smokeless tobacco: Never  Vaping Use   Vaping Use: Never used  Substance Use Topics   Alcohol use: Yes    Comment: occasional    Drug use: No    Home Medications Prior to Admission medications   Medication Sig Start Date End Date Taking? Authorizing Provider  benzonatate (TESSALON) 200 MG capsule Take 1 capsule (200 mg total) by mouth every 8 (eight) hours. 06/19/21  Yes Dartha Lodge, PA-C  ipratropium-albuterol (DUONEB) 0.5-2.5 (3) MG/3ML SOLN Take 3 mLs by nebulization every 4 (four) hours as needed. 06/19/21  Yes Dartha Lodge, PA-C  predniSONE (DELTASONE) 20 MG tablet Take 3 tablets (60 mg total) by mouth daily for  5 days. 06/19/21 06/24/21 Yes Dartha Lodge, PA-C  acetaminophen (TYLENOL) 325 MG tablet Take 2 tablets (650 mg total) by mouth every 6 (six) hours as needed (or Temp > 100). 08/03/12   Sherrie George, PA-C  amLODipine (NORVASC) 5 MG tablet Take 5 mg by mouth daily.    [provider]  doxycycline (VIBRA-TABS) 100 MG tablet Take 100 mg by mouth 2 (two) times daily.    [provider]  HYDROmorphone (DILAUDID) 4 MG tablet Take 1 tablet (4 mg total) by mouth every 4 (four) hours as needed for severe pain. 06/25/15   Molpus, John,  MD  losartan (COZAAR) 50 MG tablet Take 50 mg by mouth daily.    [provider]  metFORMIN (GLUCOPHAGE) 500 MG tablet Take 1 tablet (500 mg total) by mouth daily. 08/03/12   Sherrie George, PA-C  ondansetron (ZOFRAN ODT) 8 MG disintegrating tablet Take 1 tablet (8 mg total) by mouth every 8 (eight) hours as needed for nausea or vomiting. 06/25/15   Molpus, John, MD  salmeterol (SEREVENT) 50 MCG/DOSE diskus inhaler Inhale 1 puff into the lungs 2 (two) times daily.    [provider]  tamsulosin (FLOMAX) 0.4 MG CAPS capsule Take 1 capsule daily until stone passes. 06/25/15   Molpus, John, MD    Allergies    Aspirin  Review of Systems   Review of Systems  Constitutional:  Positive for fatigue. Negative for chills and fever.  HENT:  Negative for congestion, rhinorrhea and sore throat.   Respiratory:  Positive for cough, shortness of breath and wheezing.   Cardiovascular:  Positive for chest pain. Negative for palpitations and leg swelling.  Gastrointestinal:  Negative for abdominal pain, nausea and vomiting.  Genitourinary:  Negative for dysuria.  Musculoskeletal:  Negative for myalgias.  Skin:  Negative for color change and rash.  Neurological:  Negative for dizziness, syncope and light-headedness.  All other systems reviewed and are negative.  Physical Exam Updated Vital Signs BP (!) 168/96   Pulse 78   Temp 98.6 F (37 C)   Resp 16   Ht 5\' 10"  (1.778 m)   SpO2 98%   BMI 40.18 kg/m   Physical Exam Vitals and nursing note reviewed.  Constitutional:      General: He is not in acute distress.    Appearance: Normal appearance. He is well-developed. He is obese. He is ill-appearing. He is not diaphoretic.  HENT:     Head: Normocephalic and atraumatic.     Mouth/Throat:     Mouth: Mucous membranes are moist.     Pharynx: Oropharynx is clear.  Eyes:     General:        Right eye: No discharge.        Left eye: No discharge.  Cardiovascular:     Rate and  Rhythm: Normal rate and regular rhythm.     Pulses: Normal pulses.     Heart sounds: Normal heart sounds.  Pulmonary:     Effort: Tachypnea present. No respiratory distress.     Breath sounds: Decreased breath sounds and wheezing present. No rales.     Comments: Pt is tachypneic with mildly increased work of breathing, worse with activity, able to speak in full sentences, frequent dry cough on exam, on auscultation pt with diffuse expiratory wheezing, and decreased air movement throughout Chest:     Chest wall: No tenderness.  Abdominal:     General: Bowel sounds are normal. There is no distension.  Palpations: Abdomen is soft. There is no mass.     Tenderness: There is no abdominal tenderness. There is no guarding.     Comments: Abdomen soft, nondistended, nontender to palpation in all quadrants without guarding or peritoneal signs  Musculoskeletal:        General: No deformity.     Cervical back: Neck supple.     Right lower leg: No tenderness. No edema.     Left lower leg: No tenderness. No edema.  Skin:    General: Skin is warm and dry.     Capillary Refill: Capillary refill takes less than 2 seconds.  Neurological:     Mental Status: He is alert and oriented to person, place, and time.     Coordination: Coordination normal.     Comments: Speech is clear, able to follow commands Moves extremities without ataxia, coordination intact  Psychiatric:        Mood and Affect: Mood normal.        Behavior: Behavior normal.    ED Results / Procedures / Treatments   Labs (all labs ordered are listed, but only abnormal results are displayed) Labs Reviewed  COMPREHENSIVE METABOLIC PANEL - Abnormal; Notable for the following components:      Result Value   Glucose, Bld 211 (*)    AST 55 (*)    ALT 80 (*)    All other components within normal limits  RESP PANEL BY RT-PCR (FLU A&B, COVID) ARPGX2  CBC WITH DIFFERENTIAL/PLATELET  BRAIN NATRIURETIC PEPTIDE  TROPONIN I (HIGH  SENSITIVITY)  TROPONIN I (HIGH SENSITIVITY)    EKG EKG Interpretation  Date/Time:  Thursday June 19 2021 09:40:44 EDT Ventricular Rate:  80 PR Interval:  132 QRS Duration: 94 QT Interval:  390 QTC Calculation: 449 R Axis:   -27 Text Interpretation: Normal sinus rhythm Incomplete right bundle branch block Minimal voltage criteria for LVH, may be normal variant ( R in aVL ) Borderline ECG When compared to prior, faster rate than prior. No STEMI Confirmed by Theda Belfast ([id number]) on 06/19/2021 12:03:41 PM  Radiology DG Chest 2 View  Result Date: 06/19/2021 CLINICAL DATA:  SOB and chest pain EXAM: CHEST - 2 VIEW COMPARISON:  June 30, 2012. FINDINGS: Low lung volumes. Streaky bibasilar opacities. Chronically elevated right hemidiaphragm. No visible pleural effusions or pneumothorax. Similar cardiomediastinal silhouette. Cholecystectomy clips. IMPRESSION: Low lung volumes with streaky bibasilar opacities, most likely atelectasis. No substantial change from priors. Electronically Signed   By: Feliberto Harts M.D.   On: 06/19/2021 09:53   CT Angio Chest PE W and/or Wo Contrast  Result Date: 06/19/2021 CLINICAL DATA:  Shortness of breath and cough x1 week. EXAM: CT ANGIOGRAPHY CHEST WITH CONTRAST TECHNIQUE: Multidetector CT imaging of the chest was performed using the standard protocol during bolus administration of intravenous contrast. Multiplanar CT image reconstructions and MIPs were obtained to evaluate the vascular anatomy. CONTRAST:  OMNIPAQUE IOHEXOL 350 MG/ML SOLN COMPARISON:  None. FINDINGS: Cardiovascular: Satisfactory opacification of the pulmonary arteries to the segmental level. No evidence of pulmonary embolism. Normal heart size with mild to moderate severity coronary artery calcification. No pericardial effusion. Mediastinum/Nodes: No enlarged mediastinal, hilar, or axillary lymph nodes. Thyroid gland, trachea, and esophagus demonstrate no significant findings.  Lungs/Pleura: Mild atelectasis is seen within the bilateral lung bases. Mild elevation of the right hemidiaphragm is also noted. There is no evidence of a pleural effusion or pneumothorax. Upper Abdomen: There is diffuse fatty infiltration of the liver parenchyma. Multiple surgical clips  are seen within the gallbladder fossa. Musculoskeletal: No chest wall abnormality. No acute or significant osseous findings. Review of the MIP images confirms the above findings. IMPRESSION: 1. No CT evidence of pulmonary embolism or acute cardiopulmonary disease. 2. Mild to moderate severity coronary artery calcification. 3. Fatty liver. 4. Evidence of prior cholecystectomy. Electronically Signed   By: Aram Candela M.D.   On: 06/19/2021 16:09    Procedures Procedures   Medications Ordered in ED Medications  albuterol (VENTOLIN HFA) 108 (90 Base) MCG/ACT inhaler 2 puff (2 puffs Inhalation Given 06/19/21 1353)  ipratropium-albuterol (DUONEB) 0.5-2.5 (3) MG/3ML nebulizer solution 3 mL (3 mLs Nebulization Given 06/19/21 1420)  albuterol (PROVENTIL,VENTOLIN) solution continuous neb (10 mg/hr Nebulization New Bag/Given 06/19/21 1641)  methylPREDNISolone sodium succinate (SOLU-MEDROL) 125 mg/2 mL injection 125 mg (125 mg Intravenous Given 06/19/21 1427)  iohexol (OMNIPAQUE) 350 MG/ML injection 100 mL (100 mLs Intravenous Contrast Given 06/19/21 1549)    ED Course  I have reviewed the triage vital signs and the nursing notes.  Pertinent labs & imaging results that were available during my care of the patient were reviewed by me and considered in my medical decision making (see chart for details).    MDM Rules/Calculators/A&P                           65 y.o. male presents to the ED with complaints of shortness of breath and chest discomfort, this involves an extensive number of treatment options, and is a complaint that carries with it a high risk of complications and morbidity.  The differential diagnosis includes  pneumonia, COVID, influenza, asthma exacerbation, COPD, CHF, ACS, PE, bronchitis  On arrival pt is ill-appearing with tachypnea and increased work of breathing, but maintaining airway. He has diffuse wheezing and decreased air movement. On arrival Pt desatted to 88% with ambulation, but quickly recovered with rest, sats 92-95% on room air at rest.  Additional history obtained from patients wife. Previous records obtained and reviewed via EMR  I ordered IV solumedrol and duoneb  Lab Tests:  I Ordered, reviewed, and interpreted labs, which included:  CBC: No leukocytosis, normal hgb CMP: no significant electrolyte derangements, normal renal function, mildly elevated transaminases, but other LFTs WNL Trop: WNL BNP: no elevation  COVID/Flu: Neg  Imaging Studies ordered:  I ordered imaging studies which included CXR & CTA PE study, I independently visualized and interpreted imaging which showed:  CXR: Low lung volumes with streaky bibasilar opacities, likely atelectasis  CTA: No PE, infiltrate ot active cardiopulmonary disease  ED Course:   After initial neb treatment pt has temporary improvement, but lungs tightened back up and wheezing returned, in particular when he had to lay flat for CT. Will give 1 hr continuous albuterol and then reassess, if improved, can potentially go home with continued steroids and neb treatments at home, but if lung exam not improving or he remains hypoxic on ambulation, will require admission.  After CAT pt reports he if feeling significantly better, was able to eat, and ambulated and maintained sats > 90% without significant increased work of breathing. The remainder of patient's workup has been reassuring so suspect this is all related to asthma exacerbation.  Pt would like to go home, I have prescribed neb solution, steroid burst and tessalon perles for cough, stressed the importance of follow up and provided referral for pulmonology. Strict return  precautions discussed with patient and wife and they express understanding and agreement, discharged  home in stable condition.  Portions of this note were generated with Scientist, clinical (histocompatibility and immunogenetics). Dictation errors may occur despite best attempts at proofreading.  Final Clinical Impression(s) / ED Diagnoses Final diagnoses:  Exacerbation of asthma, unspecified asthma severity, unspecified whether persistent    Rx / DC Orders ED Discharge Orders          Ordered    ipratropium-albuterol (DUONEB) 0.5-2.5 (3) MG/3ML SOLN  Every 4 hours PRN        06/19/21 1852    predniSONE (DELTASONE) 20 MG tablet  Daily        06/19/21 1852    benzonatate (TESSALON) 200 MG capsule  Every 8 hours        06/19/21 1852             Dartha Lodge, PA-C 06/20/21 2015    Tegeler, Canary Brim, MD 06/21/21 1038

## 2021-06-19 NOTE — Progress Notes (Signed)
Patient stated that he got very SOB when walking. SAT after walking was 88%. Second MDI given at this time.

## 2021-06-19 NOTE — ED Notes (Signed)
Patient ambulated a short distance after HHN  SPO2 90-93% on RA 98% at rest

## 2021-06-19 NOTE — Discharge Instructions (Addendum)
Use nebulizer every 4 hours for the next 24 hours and then as needed every 4 hours.  Take steroids for the next 5 days.  These can cause increases in your blood sugar so please monitor this closely.  You can use Tessalon Perles as prescribed for cough.  You will need to follow-up closely with your primary care doctor and I have also put information for pulmonology office for follow-up.  Please call to schedule follow-up appointment with them as well.  If you have increasing shortness of breath despite using these medications, difficulty breathing, chest pain, fevers or other new or concerning symptoms you should return for reevaluation.

## 2022-09-29 DIAGNOSIS — N201 Calculus of ureter: Secondary | ICD-10-CM | POA: Insufficient documentation

## 2022-09-30 DIAGNOSIS — N200 Calculus of kidney: Secondary | ICD-10-CM | POA: Insufficient documentation

## 2022-12-16 ENCOUNTER — Emergency Department (HOSPITAL_COMMUNITY): Payer: Medicare Other

## 2022-12-16 ENCOUNTER — Encounter (HOSPITAL_COMMUNITY): Payer: Self-pay

## 2022-12-16 ENCOUNTER — Observation Stay (HOSPITAL_COMMUNITY)
Admission: EM | Admit: 2022-12-16 | Discharge: 2022-12-18 | Disposition: A | Discharge status: Home / Self Care | Payer: Medicare Other | Attending: Family Medicine | Admitting: Family Medicine

## 2022-12-16 ENCOUNTER — Other Ambulatory Visit: Payer: Self-pay

## 2022-12-16 DIAGNOSIS — E1169 Type 2 diabetes mellitus with other specified complication: Secondary | ICD-10-CM | POA: Diagnosis present

## 2022-12-16 DIAGNOSIS — C9000 Multiple myeloma not having achieved remission: Secondary | ICD-10-CM | POA: Diagnosis not present

## 2022-12-16 DIAGNOSIS — C7949 Secondary malignant neoplasm of other parts of nervous system: Secondary | ICD-10-CM | POA: Insufficient documentation

## 2022-12-16 DIAGNOSIS — Z794 Long term (current) use of insulin: Secondary | ICD-10-CM | POA: Diagnosis not present

## 2022-12-16 DIAGNOSIS — M545 Low back pain, unspecified: Secondary | ICD-10-CM

## 2022-12-16 DIAGNOSIS — Z6834 Body mass index (BMI) 34.0-34.9, adult: Secondary | ICD-10-CM | POA: Insufficient documentation

## 2022-12-16 DIAGNOSIS — E669 Obesity, unspecified: Secondary | ICD-10-CM | POA: Diagnosis not present

## 2022-12-16 DIAGNOSIS — E119 Type 2 diabetes mellitus without complications: Secondary | ICD-10-CM | POA: Diagnosis present

## 2022-12-16 DIAGNOSIS — Z7984 Long term (current) use of oral hypoglycemic drugs: Secondary | ICD-10-CM | POA: Diagnosis not present

## 2022-12-16 DIAGNOSIS — I10 Essential (primary) hypertension: Secondary | ICD-10-CM | POA: Diagnosis not present

## 2022-12-16 DIAGNOSIS — C7951 Secondary malignant neoplasm of bone: Secondary | ICD-10-CM

## 2022-12-16 DIAGNOSIS — J45909 Unspecified asthma, uncomplicated: Secondary | ICD-10-CM | POA: Insufficient documentation

## 2022-12-16 DIAGNOSIS — K5900 Constipation, unspecified: Secondary | ICD-10-CM | POA: Insufficient documentation

## 2022-12-16 DIAGNOSIS — M899 Disorder of bone, unspecified: Secondary | ICD-10-CM | POA: Insufficient documentation

## 2022-12-16 MED ORDER — DIPHENHYDRAMINE HCL 50 MG/ML IJ SOLN
12.5000 mg | Freq: Once | INTRAMUSCULAR | Status: AC
  Administered 2022-12-16: 12.5 mg via INTRAMUSCULAR
  Filled 2022-12-16: qty 1

## 2022-12-16 MED ORDER — GADOBUTROL 1 MMOL/ML IV SOLN
10.0000 mL | Freq: Once | INTRAVENOUS | Status: AC | PRN
  Administered 2022-12-16: 10 mL via INTRAVENOUS

## 2022-12-16 MED ORDER — METOCLOPRAMIDE HCL 5 MG/ML IJ SOLN
5.0000 mg | Freq: Once | INTRAMUSCULAR | Status: AC
  Administered 2022-12-16: 5 mg via INTRAMUSCULAR
  Filled 2022-12-16: qty 2

## 2022-12-16 NOTE — Assessment & Plan Note (Addendum)
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  There is also an expansive lesion at C3 causing moderate to severe spinal canal stenosis.  Neuro exam consisted of 4/5 left upper and lower extremity strength and reduced perioral sensation on the left side.  Reports intentional weight loss of 25 pounds since February.  MRI brain showed numerous enhancing lesions of the base of the skull and calvarium with suspected multiple myeloma. - Neurosurgery consulted, appreciate recs - Oncology consulted, appreciate recs - PT/OT eval and treat - Continue C-spine collar - MRI brain pending
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
## 2022-12-16 NOTE — ED Notes (Signed)
Patient transported to MRI 

## 2022-12-16 NOTE — ED Notes (Signed)
Patient in MRI 

## 2022-12-17 ENCOUNTER — Observation Stay (HOSPITAL_COMMUNITY): Payer: Medicare Other

## 2022-12-17 ENCOUNTER — Encounter (HOSPITAL_COMMUNITY): Payer: Self-pay | Admitting: Student

## 2022-12-17 DIAGNOSIS — M899 Disorder of bone, unspecified: Secondary | ICD-10-CM | POA: Diagnosis not present

## 2022-12-17 DIAGNOSIS — K5900 Constipation, unspecified: Secondary | ICD-10-CM

## 2022-12-17 DIAGNOSIS — E1169 Type 2 diabetes mellitus with other specified complication: Secondary | ICD-10-CM | POA: Diagnosis not present

## 2022-12-17 DIAGNOSIS — C7951 Secondary malignant neoplasm of bone: Secondary | ICD-10-CM

## 2022-12-17 DIAGNOSIS — E669 Obesity, unspecified: Secondary | ICD-10-CM | POA: Diagnosis not present

## 2022-12-17 DIAGNOSIS — C9000 Multiple myeloma not having achieved remission: Secondary | ICD-10-CM | POA: Diagnosis not present

## 2022-12-17 DIAGNOSIS — C7949 Secondary malignant neoplasm of other parts of nervous system: Secondary | ICD-10-CM | POA: Diagnosis not present

## 2022-12-17 HISTORY — DX: Constipation, unspecified: K59.00

## 2022-12-17 HISTORY — DX: Secondary malignant neoplasm of bone: C79.51

## 2022-12-17 LAB — HIV ANTIBODY (ROUTINE TESTING W REFLEX): HIV Screen 4th Generation wRfx: NONREACTIVE

## 2022-12-17 LAB — BASIC METABOLIC PANEL
Anion gap: 9 (ref 5–15)
BUN: 20 mg/dL (ref 8–23)
CO2: 25 mmol/L (ref 22–32)
Calcium: 9.5 mg/dL (ref 8.9–10.3)
Chloride: 103 mmol/L (ref 98–111)
Creatinine, Ser: 1 mg/dL (ref 0.61–1.24)
GFR, Estimated: >60 mL/min (ref >60)
Glucose, Bld: 84 mg/dL (ref 70–99)
Potassium: 4.8 mmol/L (ref 3.5–5.1)
Sodium: 137 mmol/L (ref 135–145)

## 2022-12-17 LAB — I-STAT CHEM 8, ED
BUN: 25 mg/dL — ABNORMAL HIGH (ref 8–23)
Calcium, Ion: 1.18 mmol/L (ref 1.15–1.40)
Chloride: 104 mmol/L (ref 98–111)
Creatinine, Ser: 1 mg/dL (ref 0.61–1.24)
Glucose, Bld: 83 mg/dL (ref 70–99)
HCT: 48 % (ref 39.0–52.0)
Hemoglobin: 16.3 g/dL (ref 13.0–17.0)
Potassium: 4.7 mmol/L (ref 3.5–5.1)
Sodium: 140 mmol/L (ref 135–145)
TCO2: 28 mmol/L (ref 22–32)

## 2022-12-17 LAB — CBC WITH DIFFERENTIAL/PLATELET
Abs Immature Granulocytes: 0.03 10*3/uL (ref 0.00–0.07)
Basophils Absolute: 0 10*3/uL (ref 0.0–0.1)
Basophils Relative: 0 %
Eosinophils Absolute: 0.2 10*3/uL (ref 0.0–0.5)
Eosinophils Relative: 2 %
HCT: 46.4 % (ref 39.0–52.0)
Hemoglobin: 14.7 g/dL (ref 13.0–17.0)
Immature Granulocytes: 0 %
Lymphocytes Relative: 15 %
Lymphs Abs: 1.5 10*3/uL (ref 0.7–4.0)
MCH: 28.9 pg (ref 26.0–34.0)
MCHC: 31.7 g/dL (ref 30.0–36.0)
MCV: 91.3 fL (ref 80.0–100.0)
Monocytes Absolute: 0.7 10*3/uL (ref 0.1–1.0)
Monocytes Relative: 7 %
Neutro Abs: 7 10*3/uL (ref 1.7–7.7)
Neutrophils Relative %: 76 %
Platelets: 200 10*3/uL (ref 150–400)
RBC: 5.08 MIL/uL (ref 4.22–5.81)
RDW: 15.5 % (ref 11.5–15.5)
WBC: 9.4 10*3/uL (ref 4.0–10.5)
nRBC: 0 % (ref 0.0–0.2)

## 2022-12-17 LAB — GLUCOSE, CAPILLARY
Glucose-Capillary: 149 mg/dL — ABNORMAL HIGH (ref 70–99)
Glucose-Capillary: 157 mg/dL — ABNORMAL HIGH (ref 70–99)

## 2022-12-17 MED ORDER — ACETAMINOPHEN 650 MG RE SUPP: 650.0000 mg | Freq: Four times a day (QID) | RECTAL | Status: DC | PRN

## 2022-12-17 MED ORDER — POLYETHYLENE GLYCOL 3350 17 G PO PACK
17.0000 g | PACK | Freq: Every day | ORAL | Status: DC
  Administered 2022-12-17: 17 g via ORAL
  Filled 2022-12-17: qty 1

## 2022-12-17 MED ORDER — LINAGLIPTIN 5 MG PO TABS
5.0000 mg | ORAL_TABLET | Freq: Every day | ORAL | Status: DC
  Administered 2022-12-17 – 2022-12-18 (×2): 5 mg via ORAL
  Filled 2022-12-17 (×2): qty 1

## 2022-12-17 MED ORDER — SENNA 8.6 MG PO TABS
1.0000 | ORAL_TABLET | Freq: Two times a day (BID) | ORAL | Status: DC
  Administered 2022-12-17: 8.6 mg via ORAL
  Filled 2022-12-17 (×3): qty 1

## 2022-12-17 MED ORDER — LOSARTAN POTASSIUM 50 MG PO TABS
50.0000 mg | ORAL_TABLET | Freq: Every day | ORAL | Status: DC
  Administered 2022-12-17 – 2022-12-18 (×2): 50 mg via ORAL
  Filled 2022-12-17 (×2): qty 1

## 2022-12-17 MED ORDER — AMLODIPINE BESYLATE 5 MG PO TABS
5.0000 mg | ORAL_TABLET | Freq: Every day | ORAL | Status: DC
  Administered 2022-12-17 – 2022-12-18 (×2): 5 mg via ORAL
  Filled 2022-12-17 (×2): qty 1

## 2022-12-17 MED ORDER — ENOXAPARIN SODIUM 40 MG/0.4ML IJ SOSY
40.0000 mg | PREFILLED_SYRINGE | Freq: Every day | INTRAMUSCULAR | Status: DC
  Filled 2022-12-17: qty 0.4

## 2022-12-17 MED ORDER — GADOBUTROL 1 MMOL/ML IV SOLN
10.0000 mL | Freq: Once | INTRAVENOUS | Status: AC | PRN
  Administered 2022-12-17: 10 mL via INTRAVENOUS

## 2022-12-17 MED ORDER — ACETAMINOPHEN 325 MG PO TABS
650.0000 mg | ORAL_TABLET | Freq: Four times a day (QID) | ORAL | Status: DC | PRN
  Administered 2022-12-17 – 2022-12-18 (×2): 650 mg via ORAL
  Filled 2022-12-17 (×2): qty 2

## 2022-12-17 MED ORDER — HEPARIN SODIUM (PORCINE) 5000 UNIT/ML IJ SOLN
5000.0000 [IU] | Freq: Once | INTRAMUSCULAR | Status: AC
  Administered 2022-12-17: 5000 [IU] via SUBCUTANEOUS
  Filled 2022-12-17: qty 1
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
## 2022-12-17 NOTE — Progress Notes (Signed)
PT Cancellation Note  Patient Details Name: Jeffery Higgins MRN: [id number] DOB: [birth year]   Cancelled Treatment:    Reason Eval/Treat Not Completed: PT screened, no needs identified, will sign off  Patient denies any problems with mobility or balance. Reports he has been walking in room unassisted and even took a shower earlier. RN confirmed. Patient deferred PT evaluation.    Jerolyn Center, PT Acute Rehabilitation Services  Office [phone]  Zena Amos 12/17/2022, 1:53 PM
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
## 2022-12-17 NOTE — Assessment & Plan Note (Signed)
Reports constipation.  Taking MiraLAX at home, without relief. - MiraLAX, senna scheduled

## 2022-12-17 NOTE — Progress Notes (Signed)
OT Cancellation Note  Patient Details Name: Jeffery Higgins MRN: [id number] DOB: 1955-12-04   Cancelled Treatment:    Reason Eval/Treat Not Completed: Patient not medically ready. Noted MRI results and neurosurgery consult pending. Will defer OT evaluation until after consult completed (and if pt cleared for activity)   Galen Manila 12/17/2022, 9:28 AM

## 2022-12-17 NOTE — Progress Notes (Signed)
PT Cancellation Note  Patient Details Name: Jeffery Higgins MRN: [id number] DOB: 25-Jan-1956   Cancelled Treatment:    Reason Eval/Treat Not Completed: Other (comment)  Noted MRI results and neurosurgery consult pending. Will defer PT evaluation until after consult completed (and if pt cleared for activity).    Jeffery Higgins, PT Acute Rehabilitation Services  Office [phone]   Zena Amos 12/17/2022, 8:09 AM

## 2022-12-18 ENCOUNTER — Encounter: Payer: Self-pay | Admitting: *Deleted

## 2022-12-18 ENCOUNTER — Other Ambulatory Visit: Payer: Self-pay

## 2022-12-18 ENCOUNTER — Ambulatory Visit
Admit: 2022-12-18 | Discharge: 2022-12-18 | Disposition: A | Discharge status: Home / Self Care | Payer: Medicare Other | Attending: Radiation Oncology | Admitting: Radiation Oncology

## 2022-12-18 ENCOUNTER — Observation Stay (HOSPITAL_COMMUNITY): Payer: Medicare Other

## 2022-12-18 ENCOUNTER — Ambulatory Visit
Admission: RE | Admit: 2022-12-18 | Discharge: 2022-12-18 | Disposition: A | Discharge status: Home / Self Care | Payer: Medicare Other | Source: Ambulatory Visit | Attending: Radiation Oncology | Admitting: Radiation Oncology

## 2022-12-18 DIAGNOSIS — Z510 Encounter for antineoplastic radiation therapy: Secondary | ICD-10-CM | POA: Insufficient documentation

## 2022-12-18 DIAGNOSIS — C9000 Multiple myeloma not having achieved remission: Secondary | ICD-10-CM | POA: Insufficient documentation

## 2022-12-18 DIAGNOSIS — C7951 Secondary malignant neoplasm of bone: Secondary | ICD-10-CM | POA: Diagnosis not present

## 2022-12-18 HISTORY — DX: Multiple myeloma not having achieved remission: C90.00

## 2022-12-18 LAB — RAD ONC ARIA SESSION SUMMARY
Course Elapsed Days: 0
Plan Fractions Treated to Date: 1
Plan Fractions Treated to Date: 1
Plan Prescribed Dose Per Fraction: 2 Gy
Plan Prescribed Dose Per Fraction: 3 Gy
Plan Total Fractions Prescribed: 10
Plan Total Fractions Prescribed: 10
Plan Total Prescribed Dose: 20 Gy
Plan Total Prescribed Dose: 30 Gy
Reference Point Dosage Given to Date: 2 Gy
Reference Point Dosage Given to Date: 3 Gy
Reference Point Session Dosage Given: 2 Gy
Reference Point Session Dosage Given: 3 Gy
Session Number: 1

## 2022-12-18 LAB — CBC WITH DIFFERENTIAL/PLATELET
Abs Immature Granulocytes: 0.02 10*3/uL (ref 0.00–0.07)
Basophils Absolute: 0 10*3/uL (ref 0.0–0.1)
Basophils Relative: 0 %
Eosinophils Absolute: 0.3 10*3/uL (ref 0.0–0.5)
Eosinophils Relative: 4 %
HCT: 41 % (ref 39.0–52.0)
Hemoglobin: 13.6 g/dL (ref 13.0–17.0)
Immature Granulocytes: 0 %
Lymphocytes Relative: 30 %
Lymphs Abs: 2 10*3/uL (ref 0.7–4.0)
MCH: 29.5 pg (ref 26.0–34.0)
MCHC: 33.2 g/dL (ref 30.0–36.0)
MCV: 88.9 fL (ref 80.0–100.0)
Monocytes Absolute: 0.8 10*3/uL (ref 0.1–1.0)
Monocytes Relative: 12 %
Neutro Abs: 3.6 10*3/uL (ref 1.7–7.7)
Neutrophils Relative %: 54 %
Platelets: 173 10*3/uL (ref 150–400)
RBC: 4.61 MIL/uL (ref 4.22–5.81)
RDW: 15.4 % (ref 11.5–15.5)
WBC: 6.7 10*3/uL (ref 4.0–10.5)
nRBC: 0 % (ref 0.0–0.2)

## 2022-12-18 LAB — BETA 2 MICROGLOBULIN, SERUM: Beta-2 Microglobulin: 2.4 mg/L (ref 0.6–2.4)

## 2022-12-18 LAB — KAPPA/LAMBDA LIGHT CHAINS
Kappa free light chain: 30.3 mg/L — ABNORMAL HIGH (ref 3.3–19.4)
Kappa, lambda light chain ratio: 1.56 (ref 0.26–1.65)
Lambda free light chains: 19.4 mg/L (ref 5.7–26.3)

## 2022-12-18 MED ORDER — FENTANYL CITRATE (PF) 100 MCG/2ML IJ SOLN
INTRAMUSCULAR | Status: AC | PRN
  Administered 2022-12-18: 25 ug via INTRAVENOUS

## 2022-12-18 MED ORDER — MIDAZOLAM HCL 2 MG/2ML IJ SOLN
INTRAMUSCULAR | Status: AC
  Filled 2022-12-18: qty 2

## 2022-12-18 MED ORDER — OXYCODONE HCL 5 MG PO TABS
5.0000 mg | ORAL_TABLET | Freq: Once | ORAL | Status: AC
  Administered 2022-12-18: 5 mg via ORAL
  Filled 2022-12-18: qty 1

## 2022-12-18 MED ORDER — OXYCODONE HCL 5 MG PO TABS: 5.0000 mg | ORAL_TABLET | ORAL | 0 refills | Status: AC | PRN

## 2022-12-18 MED ORDER — MIDAZOLAM HCL 2 MG/2ML IJ SOLN
INTRAMUSCULAR | Status: AC | PRN
  Administered 2022-12-18: 1 mg via INTRAVENOUS
  Administered 2022-12-18: .5 mg via INTRAVENOUS

## 2022-12-18 MED ORDER — OXYCODONE HCL 5 MG PO TABS: 5.0000 mg | ORAL_TABLET | ORAL | 0 refills | Status: DC | PRN

## 2022-12-18 MED ORDER — POLYETHYLENE GLYCOL 3350 17 G PO PACK: 17.0000 g | PACK | Freq: Every day | ORAL | 0 refills | Status: DC

## 2022-12-18 MED ORDER — SENNA 8.6 MG PO TABS: 1.0000 | ORAL_TABLET | Freq: Two times a day (BID) | ORAL | 0 refills | Status: AC

## 2022-12-18 MED ORDER — POLYETHYLENE GLYCOL 3350 17 G PO PACK: 17.0000 g | PACK | Freq: Every day | ORAL | 0 refills | Status: AC

## 2022-12-18 MED ORDER — FENTANYL CITRATE (PF) 100 MCG/2ML IJ SOLN
INTRAMUSCULAR | Status: AC | PRN
  Administered 2022-12-18 (×3): 25 ug via INTRAVENOUS

## 2022-12-18 MED ORDER — FENTANYL CITRATE (PF) 100 MCG/2ML IJ SOLN
INTRAMUSCULAR | Status: AC
  Filled 2022-12-18: qty 2

## 2022-12-18 NOTE — Plan of Care (Signed)
  Problem: Clinical Measurements: Goal: Will remain free from infection Outcome: Not Progressing   

## 2022-12-18 NOTE — Plan of Care (Signed)
  Problem: Clinical Measurements: Goal: Will remain free from infection 12/18/2022 1247 by Dannielle Burn, RN Outcome: Adequate for Discharge 12/18/2022 1217 by Dannielle Burn, RN Outcome: Not Progressing

## 2022-12-18 NOTE — Progress Notes (Signed)
  Radiation Oncology         [phone] ________________________________  Name: Jeffery Higgins MRN: [id number]  Date: 12/18/2022  DOB: [birth year]  SIMULATION AND TREATMENT PLANNING NOTE    ICD-10-CM   1. Multiple myeloma without remission (HCC)  C90.00       DIAGNOSIS:  67 yo with multiple myeloma with symptomatic involvement C3 and right humeral head  NARRATIVE:  The patient was brought to the CT Simulation planning suite.  Identity was confirmed.  All relevant records and images related to the planned course of therapy were reviewed.  The patient freely provided informed written consent to proceed with treatment after reviewing the details related to the planned course of therapy. The consent form was witnessed and verified by the simulation staff.  Then, the patient was set-up in a stable reproducible  supine position for radiation therapy.  CT images were obtained.  Surface markings were placed.  The CT images were loaded into the planning software.  Then the target and avoidance structures were contoured.  Treatment planning then occurred.  The radiation prescription was entered and confirmed.  Then, I designed and supervised the construction of several medically necessary complex treatment devices incluidng mask and MLCs.  I have requested : 3D Simulation  I have requested a DVH of the following structures: spinal cord, esophagus, cochlea and others.   PLAN:  The patient will receive 20 Gy in 10 fractions starting today and resuming Monday.  ________________________________  Artist Pais. Kathrynn Running, M.D.

## 2022-12-18 NOTE — Progress Notes (Signed)
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 findings are seen in noncontrast CT brain. Electronically Signed   By: Ernie Avena M.D.   On: 12/16/2022 20:34    Assessment/Plan: Multiple myeloma, C3 metastasis: The patient is asymptomatic from a C3 lesion.  Flu (again radiation therapy and medical treatment of his myeloma before it gets large enough to his spinal cord dysfunction and require surgery.  I have answered all the questions.  Please have him follow-up with me in the office in a couple weeks for follow-up x-ray.  LOS: 0 days     Cristi Loron 12/18/2022, 6:59 AM     Patient ID: Jeffery Higgins, male   DOB: [birth year], 67 y.o.   MRN: [id number]

## 2022-12-18 NOTE — Progress Notes (Signed)
OT Cancellation Note  Patient Details Name: Jeffery Higgins MRN: [id number] DOB: 07/10/1956   Cancelled Treatment:    Reason Eval/Treat Not Completed: Patient at procedure or test/ unavailable Patient is getting a bone marrow biopsy currently per RN. OT will follow back on 5/4 to assess need for OT evaluation, however patient was independent taking a shower on 5/2 per PT note and will likely be a screen for OT evaluation.   Pollyann Glen E. Syla Devoss, OTR/L [facility] [phone]   Cherlyn Cushing 12/18/2022, 8:28 AM

## 2022-12-18 NOTE — Progress Notes (Signed)
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   On: 12/16/2022  20:34    Medications: I have reviewed the patient's current medications.  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  Left shoulder pain-likely secondary to a lytic lesion at the left humeral head/neck 3.  Left chin/distal mandible numbness-likely secondary to myeloma compressing nerve roots at the brainstem or left face 4.  Hypertension 5.   Diabetes 6.  Sleep apnea 7.  Nephrolithiasis-right ureteral calculus urinary tract infection, placement of a right ureter stent right stone dislodgment 10/01/2022 8.  Asthma    Jeffery Higgins has a clinical presentation consistent with advanced multiple myeloma.  He will undergo a diagnostic bone marrow biopsy and metastatic bone survey today.  He will be seen by radiation oncology today to consider palliative radiation to the C3 lesion.  The myeloma panel from yesterday is pending.  I will arrange for outpatient follow-up at the Cancer center next week.  We will initiate systemic therapy when a myeloma diagnosis is confirmed.    LOS: 0 days   Thornton Papas, MD   12/18/2022, 2:10 PM
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
## 2022-12-18 NOTE — Procedures (Signed)
Interventional Radiology Procedure:   Indications: Lytic bone lesions  Procedure: CT guided bone marrow biopsy  Findings: 3 aspirates and 3 cores from right ilium.   Complications: None     EBL: Minimal  Plan: Bedrest and return to inpatient floor   Hernan Turnage R. Lowella Dandy, MD  Pager: [phone]

## 2022-12-18 NOTE — Consult Note (Signed)
Chief Complaint: Patient was seen in consultation today for multiple myeloma  Referring Physician(s): Dr. Thornton Papas  Supervising Physician: Richarda Overlie  Patient Status: [facility] - In-pt  History of Present Illness: Jeffery Higgins is a 67 y.o. male with recent shoulder and back pain was found to have multiple bony lesions after CT imaging at an outside hospital. Ultimately he was referred to the [facility] ED where further work-up with MR imaging demonstrated: 1. 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 No evidence of pathologic fracture. 2. Expansile lesion at C3 extends into the spinal canal by approximately 4 mm and causes moderate to severe spinal canal stenosis posterior to the C3 vertebral body. 3. L4-L5 moderate to severe spinal canal stenosis and moderate bilateral neural foraminal narrowing, which appears degenerative. 4. L3-L4 mild spinal canal stenosis and mild bilateral neural foraminal narrowing, which appears degenerative. Narrowing of the lateral recesses at this level could affect the descending L4 nerve roots. 5. Mild right neural foraminal narrowing at T2-T3 and T3-T4 which is likely related to osseous tumor at T3.  IR consulted for bone marrow biopsy at the request of Dr. Truett Perna. Patient assessed in [facility] Radiology this AM.  He is aware of goals of the procedure today after multiple discussions with his care team.   He is in a supportive cervical neck brace due to expansile lesions with spinal canal stenosis.   He is agreeable to proceed.  He has been NPO.    Past Medical History:  Diagnosis Date   Arthritis    Asthma    BMI 40.0-44.9, adult (HCC) 08/03/2012   Diabetes mellitus type 2 in obese 08/03/2012   Diabetes mellitus without complication (HCC)    History of kidney stones 08/18/2003   Hypertension    Hypertension 08/03/2012    Kidney stone    Sleep apnea    not using CPAP - could not tolerate    Past Surgical History:  Procedure Laterality Date   CHOLECYSTECTOMY  08/02/2012   Procedure: LAPAROSCOPIC CHOLECYSTECTOMY WITH INTRAOPERATIVE CHOLANGIOGRAM;  Surgeon: [name], MD;  Location: [facility] OR;  Service: General;  Laterality: N/A;  laparoscopic cholecystectomy with intraoperative cholangiogram   KNEE ARTHROSCOPY     LITHOTRIPSY     SHOULDER ARTHROSCOPY WITH SUBACROMIAL DECOMPRESSION  07/07/2012   Procedure: SHOULDER ARTHROSCOPY WITH SUBACROMIAL DECOMPRESSION;  Surgeon: [name], MD;  Location: [facility] OR;  Service: Orthopedics;  Laterality: Right;  with distal clavicle resection    Allergies: Aspirin and Other  Medications: Prior to Admission medications   Medication Sig Start Date End Date Taking? Authorizing Provider  acetaminophen (TYLENOL) 325 MG tablet Take 2 tablets (650 mg total) by mouth every 6 (six) hours as needed (or Temp > 100). 08/03/12  Yes Sherrie George, PA-C  albuterol (PROVENTIL) (2.5 MG/3ML) 0.083% nebulizer solution Inhale 2.5 mg into the lungs every 6 (six) hours as needed for shortness of breath. 02/09/19  Yes [provider]  albuterol (VENTOLIN HFA) 108 (90 Base) MCG/ACT inhaler Inhale 1 puff into the lungs every 6 (six) hours as needed for shortness of breath. 07/25/15  Yes [provider]  amLODipine (NORVASC) 5 MG tablet Take 5 mg by mouth daily.   Yes [provider]  aspirin EC 81 MG tablet Take 81 mg by mouth daily. Swallow whole.   Yes [provider]  benzonatate (TESSALON) 200 MG capsule Take 1 capsule (  200 mg total) by mouth every 8 (eight) hours. Patient taking differently: Take 200 mg by mouth 2 (two) times daily as needed for cough. 06/19/21  Yes Dartha Lodge, PA-C  cyclobenzaprine (FLEXERIL) 10 MG tablet Take 10 mg by mouth 3 (three) times daily as needed for muscle spasms. 12/11/22  Yes [provider]  glipiZIDE  (GLUCOTROL XL) 10 MG 24 hr tablet Take 10 mg by mouth daily with breakfast. 11/13/21  Yes [provider]  HYDROmorphone (DILAUDID) 4 MG tablet Take 1 tablet (4 mg total) by mouth every 4 (four) hours as needed for severe pain. 06/25/15  Yes Molpus, John, MD  JANUVIA 100 MG tablet Take 100 mg by mouth daily. 07/30/22  Yes [provider]  losartan (COZAAR) 100 MG tablet Take 100 mg by mouth daily.   Yes [provider]  metFORMIN (GLUCOPHAGE) 500 MG tablet Take 1 tablet (500 mg total) by mouth daily. 08/03/12  Yes Sherrie George, PA-C  naproxen (NAPROSYN) 500 MG tablet Take 500 mg by mouth daily as needed for mild pain. 11/23/22  Yes [provider]  ondansetron (ZOFRAN ODT) 8 MG disintegrating tablet Take 1 tablet (8 mg total) by mouth every 8 (eight) hours as needed for nausea or vomiting. 06/25/15  Yes Molpus, John, MD  salmeterol (SEREVENT) 50 MCG/DOSE diskus inhaler Inhale 1 puff into the lungs 2 (two) times daily as needed (for shortness of breath).   Yes [provider]  ipratropium-albuterol (DUONEB) 0.5-2.5 (3) MG/3ML SOLN Take 3 mLs by nebulization every 4 (four) hours as needed. 06/19/21   Dartha Lodge, PA-C  tamsulosin (FLOMAX) 0.4 MG CAPS capsule Take 1 capsule daily until stone passes. Patient not taking: Reported on 12/17/2022 06/25/15   Molpus, Jonny Ruiz, MD     History reviewed. No pertinent family history.  Social History   Socioeconomic History   Marital status: Married    Spouse name: Not on file   Number of children: Not on file   Years of education: Not on file   Highest education level: Not on file  Occupational History   Not on file  Tobacco Use   Smoking status: Never   Smokeless tobacco: Never  Vaping Use   Vaping Use: Never used  Substance and Sexual Activity   Alcohol use: Yes    Comment: occasional    Drug use: No   Sexual activity: Not on file  Other Topics Concern   Not on file  Social History Narrative   Not on  file   Social Determinants of Health   Financial Resource Strain: Not on file  Food Insecurity: No Food Insecurity (12/17/2022)   Hunger Vital Sign    Worried About Running Out of Food in the Last Year: Never true    Ran Out of Food in the Last Year: Never true  Transportation Needs: No Transportation Needs (12/17/2022)   PRAPARE - Administrator, Civil Service (Medical): No    Lack of Transportation (Non-Medical): No  Physical Activity: Not on file  Stress: Not on file  Social Connections: Not on file     Review of Systems: A 12 point ROS discussed and pertinent positives are indicated in the HPI above.  All other systems are negative.  Review of Systems  Constitutional:  Negative for fatigue and fever.  Respiratory:  Negative for cough and shortness of breath.   Cardiovascular:  Negative for chest pain.  Gastrointestinal:  Negative for abdominal pain and nausea.  Musculoskeletal:  Positive for back pain and neck pain.  Psychiatric/Behavioral:  Negative for behavioral problems and confusion.     Vital Signs: BP (!) 146/78 (BP Location: Right Arm)   Pulse 93   Temp 98.4 F (36.9 C) (Oral)   Resp 18   Ht 5\' 10"  (1.778 m)   Wt 243 lb (110.2 kg)   SpO2 93%   BMI 34.87 kg/m   Physical Exam Vitals and nursing note reviewed.  Constitutional:      General: He is not in acute distress.    Appearance: Normal appearance. He is not ill-appearing.  HENT:     Mouth/Throat:     Mouth: Mucous membranes are moist.     Pharynx: Oropharynx is clear.  Neck:     Comments: C-collar Cardiovascular:     Rate and Rhythm: Normal rate and regular rhythm.  Pulmonary:     Effort: Pulmonary effort is normal.     Breath sounds: Normal breath sounds.  Abdominal:     General: Abdomen is flat. Bowel sounds are normal. There is no distension.     Palpations: Abdomen is soft.  Skin:    General: Skin is warm and dry.  Neurological:     General: No focal deficit present.     Mental  Status: He is alert and oriented to person, place, and time. Mental status is at baseline.  Psychiatric:        Mood and Affect: Mood normal.        Behavior: Behavior normal.        Thought Content: Thought content normal.        Judgment: Judgment normal.      MD Evaluation Airway: WNL Heart: WNL Abdomen: WNL Chest/ Lungs: WNL ASA  Classification: 3 Mallampati/Airway Score: Two   Imaging: MR BRAIN W WO CONTRAST  Result Date: 12/17/2022 CLINICAL DATA:  Sensory abnormality, trigeminal origin. Perioral numbness. EXAM: MRI HEAD WITHOUT AND WITH CONTRAST TECHNIQUE: Multiplanar, multiecho pulse sequences of the brain and surrounding structures were obtained without and with intravenous contrast. Additional axial FIESTA sequence through the trigeminal nerves was obtained. CONTRAST:  10mL GADAVIST GADOBUTROL 1 MMOL/ML IV SOLN COMPARISON:  Head CT 10/16/2022. FINDINGS: Brain: No acute infarct or hemorrhage. Mild chronic small-vessel disease. No hydrocephalus or extra-axial collection. No mass or abnormal parenchymal enhancement. No foci of abnormal susceptibility. A branch of the right superior cerebellar artery compresses the cisternal segment of the right trigeminal nerve (coronal oblique image 231 series 701). Vascular: Otherwise normal flow voids and enhancement. Skull and upper cervical spine: Numerous enhancing lesions of the skull base and calvarium, consistent with suspected multiple myeloma. No evidence of extraosseous component. Sinuses/Orbits: Unremarkable. Other: None. IMPRESSION: 1. A branch of the right superior cerebellar artery compresses the cisternal segment of the right trigeminal nerve. Correlate with laterality of perioral numbness. 2. Numerous enhancing lesions of the skull base and calvarium, consistent with suspected multiple myeloma. No evidence of extraosseous component. Electronically Signed   By: Orvan Falconer M.D.   On: 12/17/2022 14:47   DG CHEST PORT 1 VIEW  Result  Date: 12/17/2022 CLINICAL DATA:  [id number] Preop examination [id number] EXAM: PORTABLE CHEST 1 VIEW COMPARISON:  Radiograph 06/19/2021 FINDINGS: Unchanged cardiomediastinal silhouette. Low lung volumes. Left basilar subsegmental atelectasis. No airspace consolidation. No large effusion or evidence of pneumothorax. Known osseous metastatic disease is best visualized on prior MRI. IMPRESSION: Low lung volumes with left basilar subsegmental atelectasis. Electronically Signed   By: Caprice Renshaw M.D.   On: 12/17/2022  08:44   MR Cervical Spine W and Wo Contrast  Result Date: 12/17/2022 CLINICAL DATA:  Spine metastases, monitor EXAM: MRI CERVICAL, THORACIC AND LUMBAR SPINE WITHOUT AND WITH CONTRAST TECHNIQUE: Multiplanar and multiecho pulse sequences of the cervical spine, to include the craniocervical junction and cervicothoracic junction, and thoracic and lumbar spine, were obtained without and with intravenous contrast. CONTRAST:  10mL GADAVIST GADOBUTROL 1 MMOL/ML IV SOLN COMPARISON:  No prior MRI of the cervical or thoracic spine available, MRI lumbar spine 06/10/2016, correlation is also made with 12/16/2022 CT cervical spine and 12/10/2022 CT lumbar spine FINDINGS: MRI CERVICAL SPINE FINDINGS Alignment: Straightening of the normal cervical lordosis. Vertebrae: An expansile, enhancing lesion has nearly completely replaced C3, with extension into the spinal canal by approximately 4 mm and extension into the prevertebral space by 6 mm (series 15, image 8). T1 hypointense, T2 hyperintense, enhancing lesions are noted at every cervical level (for example series 15, image 9 and series 7, image 9), as well as in the clivus (series 15, image 9 and bilateral occipital condyles (series 15, images 4 and 14). No other evidence of epidural extension of tumor. Increased T2 signal at the superior aspect of C4 (series 7, image 10), likely related to the fracture noted on the same-day CT. No other acute fractures seen. Cord: Normal signal  and morphology.  No abnormal enhancement. Posterior Fossa, vertebral arteries, paraspinal tissues: Negative. Disc levels: C2-C3: No significant disc bulge. Expansile lesion in the C3 does not cause significant spinal canal stenosis the disc space, but does cause moderate to severe spinal canal stenosis posterior to the C3 vertebral body. No significant neural foraminal narrowing. C3-C4: And sequencing tumor extends over the C3-C4 disc. Facet and uncovertebral hypertrophy, as well as expansile tumor. Mild spinal canal stenosis. Moderate bilateral neural foraminal narrowing. C4-C5: No significant disc bulge. Facet and uncovertebral hypertrophy. No spinal canal stenosis or neural foraminal narrowing. C5-C6: Mild disc bulge. Facet and uncovertebral hypertrophy. No spinal canal stenosis. Moderate left neural foraminal narrowing. C6-C7: No significant disc bulge. Facet arthropathy. No spinal canal stenosis or neural foraminal narrowing. C7-T1: No significant disc bulge. No spinal canal stenosis or neural foraminal narrowing. MRI THORACIC SPINE FINDINGS Alignment: Straightening of the normal thoracic kyphosis. S-shaped curvature of the thoracolumbar spine. No significant listhesis. Vertebrae: T1 hypointense, T2 hyperintense, enhancing lesions are seen at every thoracic vertebral level, as well as in the posterior elements and ribs. No vertebral body height loss to suggest pathologic fracture. There is likely extension through the posterior cortex at T3 (series 20, image 9). Cord:  Normal signal and morphology.  No abnormal enhancement. Paraspinal and other soft tissues: Evaluation is limited by respiratory motion artifact. Within this limitation, there is likely dependent atelectasis. Nonenhancing right renal cyst. Disc levels: Mild degenerative changes without high-grade spinal canal stenosis. Mild right neural foraminal narrowing at T2-T3 and T3-T4. MRI LUMBAR SPINE FINDINGS Segmentation:  5 lumbar type vertebral bodies.  Alignment: 4 mm anterolisthesis of L4 on L5 (grade 1). S-shaped curvature of the thoracolumbar spine. Vertebrae: T1 hypointense, T2 hyperintense, enhancing lesions are seen at every lumbar level, as well as in the sacrum and bilateral iliac bones. Enhancement extends through the posterior cortex of L3 (series 6, image 9), but does not result in significant spinal canal stenosis. Vertebral body heights are preserved. No evidence of pathologic fracture. Schmorl's nodes the superior aspect of L2 and L4. Conus medullaris and cauda equina: Conus extends to the L1 level. Conus and cauda equina appear normal. No  abnormal enhancement. Paraspinal and other soft tissues: No lymphadenopathy. Disc levels: T12-L1: No significant disc bulge. No spinal canal stenosis or neural foraminal narrowing. L1-L2: Minimal disc bulge. No spinal canal stenosis or neural foraminal narrowing. L2-L3: Minimal disc bulge. Mild facet arthropathy. No spinal canal stenosis or neural foraminal narrowing. L3-L4: Disc height loss and mild disc bulge. Mild facet arthropathy. Narrowing of the lateral recesses. Mild spinal canal stenosis. Mild bilateral neural foraminal narrowing. L4-L5: Grade 1 anterolisthesis with disc unroofing. Moderate to severe facet arthropathy. Ligamentum flavum hypertrophy. Moderate to severe spinal canal stenosis. Moderate bilateral neural foraminal narrowing. L5-S1: No significant disc bulge. Moderate facet arthropathy. No spinal canal stenosis or neural foraminal narrowing. IMPRESSION: 1. 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 No evidence of pathologic fracture. 2. Expansile lesion at C3 extends into the spinal canal by approximately 4 mm and causes moderate to severe spinal canal stenosis posterior to the C3 vertebral body. 3. L4-L5 moderate to severe spinal canal stenosis and moderate  bilateral neural foraminal narrowing, which appears degenerative. 4. L3-L4 mild spinal canal stenosis and mild bilateral neural foraminal narrowing, which appears degenerative. Narrowing of the lateral recesses at this level could affect the descending L4 nerve roots. 5. Mild right neural foraminal narrowing at T2-T3 and T3-T4 which is likely related to osseous tumor at T3. Electronically Signed   By: Wiliam Ke M.D.   On: 12/17/2022 02:54   MR THORACIC SPINE W WO CONTRAST  Result Date: 12/17/2022 CLINICAL DATA:  Spine metastases, monitor EXAM: MRI CERVICAL, THORACIC AND LUMBAR SPINE WITHOUT AND WITH CONTRAST TECHNIQUE: Multiplanar and multiecho pulse sequences of the cervical spine, to include the craniocervical junction and cervicothoracic junction, and thoracic and lumbar spine, were obtained without and with intravenous contrast. CONTRAST:  10mL GADAVIST GADOBUTROL 1 MMOL/ML IV SOLN COMPARISON:  No prior MRI of the cervical or thoracic spine available, MRI lumbar spine 06/10/2016, correlation is also made with 12/16/2022 CT cervical spine and 12/10/2022 CT lumbar spine FINDINGS: MRI CERVICAL SPINE FINDINGS Alignment: Straightening of the normal cervical lordosis. Vertebrae: An expansile, enhancing lesion has nearly completely replaced C3, with extension into the spinal canal by approximately 4 mm and extension into the prevertebral space by 6 mm (series 15, image 8). T1 hypointense, T2 hyperintense, enhancing lesions are noted at every cervical level (for example series 15, image 9 and series 7, image 9), as well as in the clivus (series 15, image 9 and bilateral occipital condyles (series 15, images 4 and 14). No other evidence of epidural extension of tumor. Increased T2 signal at the superior aspect of C4 (series 7, image 10), likely related to the fracture noted on the same-day CT. No other acute fractures seen. Cord: Normal signal and morphology.  No abnormal enhancement. Posterior Fossa, vertebral  arteries, paraspinal tissues: Negative. Disc levels: C2-C3: No significant disc bulge. Expansile lesion in the C3 does not cause significant spinal canal stenosis the disc space, but does cause moderate to severe spinal canal stenosis posterior to the C3 vertebral body. No significant neural foraminal narrowing. C3-C4: And sequencing tumor extends over the C3-C4 disc. Facet and uncovertebral hypertrophy, as well as expansile tumor. Mild spinal canal stenosis. Moderate bilateral neural foraminal narrowing. C4-C5: No significant disc bulge. Facet and uncovertebral hypertrophy. No spinal canal stenosis or neural foraminal narrowing. C5-C6: Mild disc bulge. Facet and uncovertebral hypertrophy. No spinal canal stenosis. Moderate left neural foraminal  narrowing. C6-C7: No significant disc bulge. Facet arthropathy. No spinal canal stenosis or neural foraminal narrowing. C7-T1: No significant disc bulge. No spinal canal stenosis or neural foraminal narrowing. MRI THORACIC SPINE FINDINGS Alignment: Straightening of the normal thoracic kyphosis. S-shaped curvature of the thoracolumbar spine. No significant listhesis. Vertebrae: T1 hypointense, T2 hyperintense, enhancing lesions are seen at every thoracic vertebral level, as well as in the posterior elements and ribs. No vertebral body height loss to suggest pathologic fracture. There is likely extension through the posterior cortex at T3 (series 20, image 9). Cord:  Normal signal and morphology.  No abnormal enhancement. Paraspinal and other soft tissues: Evaluation is limited by respiratory motion artifact. Within this limitation, there is likely dependent atelectasis. Nonenhancing right renal cyst. Disc levels: Mild degenerative changes without high-grade spinal canal stenosis. Mild right neural foraminal narrowing at T2-T3 and T3-T4. MRI LUMBAR SPINE FINDINGS Segmentation:  5 lumbar type vertebral bodies. Alignment: 4 mm anterolisthesis of L4 on L5 (grade 1). S-shaped  curvature of the thoracolumbar spine. Vertebrae: T1 hypointense, T2 hyperintense, enhancing lesions are seen at every lumbar level, as well as in the sacrum and bilateral iliac bones. Enhancement extends through the posterior cortex of L3 (series 6, image 9), but does not result in significant spinal canal stenosis. Vertebral body heights are preserved. No evidence of pathologic fracture. Schmorl's nodes the superior aspect of L2 and L4. Conus medullaris and cauda equina: Conus extends to the L1 level. Conus and cauda equina appear normal. No abnormal enhancement. Paraspinal and other soft tissues: No lymphadenopathy. Disc levels: T12-L1: No significant disc bulge. No spinal canal stenosis or neural foraminal narrowing. L1-L2: Minimal disc bulge. No spinal canal stenosis or neural foraminal narrowing. L2-L3: Minimal disc bulge. Mild facet arthropathy. No spinal canal stenosis or neural foraminal narrowing. L3-L4: Disc height loss and mild disc bulge. Mild facet arthropathy. Narrowing of the lateral recesses. Mild spinal canal stenosis. Mild bilateral neural foraminal narrowing. L4-L5: Grade 1 anterolisthesis with disc unroofing. Moderate to severe facet arthropathy. Ligamentum flavum hypertrophy. Moderate to severe spinal canal stenosis. Moderate bilateral neural foraminal narrowing. L5-S1: No significant disc bulge. Moderate facet arthropathy. No spinal canal stenosis or neural foraminal narrowing. IMPRESSION: 1. 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 No evidence of pathologic fracture. 2. Expansile lesion at C3 extends into the spinal canal by approximately 4 mm and causes moderate to severe spinal canal stenosis posterior to the C3 vertebral body. 3. L4-L5 moderate to severe spinal canal stenosis and moderate bilateral neural foraminal narrowing, which appears degenerative. 4.  L3-L4 mild spinal canal stenosis and mild bilateral neural foraminal narrowing, which appears degenerative. Narrowing of the lateral recesses at this level could affect the descending L4 nerve roots. 5. Mild right neural foraminal narrowing at T2-T3 and T3-T4 which is likely related to osseous tumor at T3. Electronically Signed   By: Wiliam Ke M.D.   On: 12/17/2022 02:54   MR Lumbar Spine W Wo Contrast  Result Date: 12/17/2022 CLINICAL DATA:  Spine metastases, monitor EXAM: MRI CERVICAL, THORACIC AND LUMBAR SPINE WITHOUT AND WITH CONTRAST TECHNIQUE: Multiplanar and multiecho pulse sequences of the cervical spine, to include the craniocervical junction and cervicothoracic junction, and thoracic and lumbar spine, were obtained without and with intravenous contrast. CONTRAST:  10mL GADAVIST GADOBUTROL 1 MMOL/ML IV SOLN COMPARISON:  No prior MRI of the cervical or thoracic spine available, MRI lumbar spine 06/10/2016,  correlation is also made with 12/16/2022 CT cervical spine and 12/10/2022 CT lumbar spine FINDINGS: MRI CERVICAL SPINE FINDINGS Alignment: Straightening of the normal cervical lordosis. Vertebrae: An expansile, enhancing lesion has nearly completely replaced C3, with extension into the spinal canal by approximately 4 mm and extension into the prevertebral space by 6 mm (series 15, image 8). T1 hypointense, T2 hyperintense, enhancing lesions are noted at every cervical level (for example series 15, image 9 and series 7, image 9), as well as in the clivus (series 15, image 9 and bilateral occipital condyles (series 15, images 4 and 14). No other evidence of epidural extension of tumor. Increased T2 signal at the superior aspect of C4 (series 7, image 10), likely related to the fracture noted on the same-day CT. No other acute fractures seen. Cord: Normal signal and morphology.  No abnormal enhancement. Posterior Fossa, vertebral arteries, paraspinal tissues: Negative. Disc levels: C2-C3: No  significant disc bulge. Expansile lesion in the C3 does not cause significant spinal canal stenosis the disc space, but does cause moderate to severe spinal canal stenosis posterior to the C3 vertebral body. No significant neural foraminal narrowing. C3-C4: And sequencing tumor extends over the C3-C4 disc. Facet and uncovertebral hypertrophy, as well as expansile tumor. Mild spinal canal stenosis. Moderate bilateral neural foraminal narrowing. C4-C5: No significant disc bulge. Facet and uncovertebral hypertrophy. No spinal canal stenosis or neural foraminal narrowing. C5-C6: Mild disc bulge. Facet and uncovertebral hypertrophy. No spinal canal stenosis. Moderate left neural foraminal narrowing. C6-C7: No significant disc bulge. Facet arthropathy. No spinal canal stenosis or neural foraminal narrowing. C7-T1: No significant disc bulge. No spinal canal stenosis or neural foraminal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

## 2022-12-18 NOTE — Discharge Instructions (Addendum)
Thank you for letting us care for you during your stay.  You were admitted to the [facility] Medicine Teaching Service.   You were admitted for bone lesions.  We found the following during your stay: Diffuse bony lesions throughout that need treatment.    We recommend follow up specifically for multiple myeloma. Please start appts at [facility] for treatment. We have also obtained a bone survey today for further evaluation of your disease.   Also, please follow instructions as previously given per neurosurgery. You may take off the collar intermittently like for showering but be very cautious. No moderate or high intensity exercise!!  Follow up with neurosurgery, Dr. Lovell Sheehan in 2 weeks  Follow up appropriately with oncology as well   We have sent a small prescription of oxycodone to use as needed for pain control.  Please follow up with your primary care physician in 1 week.   If your symptoms worsen or return, please return to the hospital.  Please let us know if you have questions about your stay at [facility].
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
## 2022-12-18 NOTE — Sedation Documentation (Signed)
Patient moved from scanner table and hematoma noted to site.  Dr. Lowella Dandy notified directly and he advised to scan patient in CT scanner again and he would come look at site.  Dr. Lowella Dandy present at bedside and deemed site okay to return to floor at this time and have patient laying on back to reduce swelling.  Site noted to be clean, dry and free from bleeding.  Patient denies pain when Dr. Lowella Dandy presses on site but complains of 6/10 pain in back otherwise.  Dr. Lowella Dandy advised to give additional 25 mcg Fentanyl via IV at this time and return patient to the floor. Patient verbalizes understanding and agrees with plan of care. Shanda Bumps, RN on floor update of patient situation and medication administration.

## 2022-12-18 NOTE — Progress Notes (Signed)
Pt back to room from bone marrow biopsy, pain at 2 out of 10, daughter at bedside. Call bell within reach.

## 2022-12-18 NOTE — Progress Notes (Signed)
Oncology Discharge Planning Note  [facility] Address: [address], [place], [place] [address] Hours of Operation:  Lewayne Bunting, Monday - Friday  Clinic Contact Information:  [phone]) [phone]  Oncology Care Team: Medical Oncologist:  Truett Perna  Patient Details: Name:  Jeffery Higgins, Jeffery Higgins MRN:   [id number] DOB:   1956/03/25 Reason for Current Admission: @PPROB @  Discharge Planning Narrative: Notification of admission received by inpatient team for [facility].  Discharge follow-up appointments for oncology are current and available on the AVS and MyChart.   Upon discharge from the hospital, hematology/oncology's post discharge plan of care for the outpatient setting is: Dec 24, 2022 at [facility] [address] [place], [place] [address]  [phone]    Zyonn Book will be called within two business days after discharge to review hematology/oncology's plan of care for full understanding.    Outpatient Oncology Specific Care Only: Oncology appointment transportation needs addressed?:  no Oncology medication management for symptom management addressed?:  no Chemo Alert Card reviewed?:  no Immunotherapy Alert Card reviewed?:  not applicable

## 2022-12-18 NOTE — Assessment & Plan Note (Signed)
CBGs in the 140s.

## 2022-12-18 NOTE — Sedation Documentation (Addendum)
Patient rates pain in back as 2/10 at this time following Fentanyl administration.  Pt remains to appear in NAD at this time and site remains clean, dry and intact. No additional swelling noted and site soft and still slightly swollen.  Patient denies pain when pressing on site and remains alert at this time.

## 2022-12-18 NOTE — Assessment & Plan Note (Addendum)
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  There is also an expansive lesion at C3 causing moderate to severe spinal canal stenosis.  Neuro exam consisted of 4/5 left upper and lower extremity strength and reduced perioral sensation on the left side.  Reports intentional weight loss of 25 pounds since February.  MRI brain showed numerous enhancing lesions of the base of the skull and calvarium with suspected multiple myeloma.  Protein electrophoresis shows M spike in the far gamma region.  Further labs for multiple myeloma including multiple myeloma panel, beta-2 microglobulin, and kappa/lambda light chain is pending.  Consulted neurologist Dr. Wilford Corner who recommended carbamazepine for symptomatic perioral numbness. - appreciate Neurosurgery recs - appreciate Oncology recs - appreciate Neurology recs - Diagnostic bone marrow biopsy - Radiation oncology consult to consider palliative radiation to C3 - PT/OT eval and treat - Continue C-spine collar - Outpatient follow-up will be scheduled at [facility] per oncology

## 2022-12-21 ENCOUNTER — Encounter: Payer: Self-pay | Admitting: *Deleted

## 2022-12-21 ENCOUNTER — Other Ambulatory Visit: Payer: Self-pay

## 2022-12-21 ENCOUNTER — Telehealth: Payer: Self-pay | Admitting: Oncology

## 2022-12-21 ENCOUNTER — Telehealth: Payer: Self-pay

## 2022-12-21 ENCOUNTER — Ambulatory Visit
Admission: RE | Admit: 2022-12-21 | Discharge: 2022-12-21 | Disposition: A | Discharge status: Home / Self Care | Payer: Medicare Other | Source: Ambulatory Visit | Attending: Radiation Oncology | Admitting: Radiation Oncology

## 2022-12-21 DIAGNOSIS — Z510 Encounter for antineoplastic radiation therapy: Secondary | ICD-10-CM | POA: Diagnosis not present

## 2022-12-21 LAB — RAD ONC ARIA SESSION SUMMARY
Course Elapsed Days: 3
Plan Fractions Treated to Date: 2
Plan Fractions Treated to Date: 2
Plan Prescribed Dose Per Fraction: 2 Gy
Plan Prescribed Dose Per Fraction: 3 Gy
Plan Total Fractions Prescribed: 10
Plan Total Fractions Prescribed: 10
Plan Total Prescribed Dose: 20 Gy
Plan Total Prescribed Dose: 30 Gy
Reference Point Dosage Given to Date: 4 Gy
Reference Point Dosage Given to Date: 6 Gy
Reference Point Session Dosage Given: 2 Gy
Reference Point Session Dosage Given: 3 Gy
Session Number: 2

## 2022-12-21 NOTE — Telephone Encounter (Signed)
Called patient unable to leave message about appointment on 12/24/22

## 2022-12-22 ENCOUNTER — Ambulatory Visit
Admission: RE | Admit: 2022-12-22 | Discharge: 2022-12-22 | Disposition: A | Discharge status: Home / Self Care | Payer: Medicare Other | Source: Ambulatory Visit | Attending: Radiation Oncology | Admitting: Radiation Oncology

## 2022-12-22 ENCOUNTER — Other Ambulatory Visit: Payer: Self-pay

## 2022-12-22 DIAGNOSIS — Z510 Encounter for antineoplastic radiation therapy: Secondary | ICD-10-CM | POA: Diagnosis not present

## 2022-12-22 LAB — RAD ONC ARIA SESSION SUMMARY
Course Elapsed Days: 4
Plan Fractions Treated to Date: 3
Plan Fractions Treated to Date: 3
Plan Prescribed Dose Per Fraction: 2 Gy
Plan Prescribed Dose Per Fraction: 3 Gy
Plan Total Fractions Prescribed: 10
Plan Total Fractions Prescribed: 10
Plan Total Prescribed Dose: 20 Gy
Plan Total Prescribed Dose: 30 Gy
Reference Point Dosage Given to Date: 6 Gy
Reference Point Dosage Given to Date: 9 Gy
Reference Point Session Dosage Given: 2 Gy
Reference Point Session Dosage Given: 3 Gy
Session Number: 3

## 2022-12-22 LAB — MULTIPLE MYELOMA PANEL, SERUM
Albumin SerPl Elph-Mcnc: 3.5 g/dL (ref 2.9–4.4)
Albumin/Glob SerPl: 0.8 (ref 0.7–1.7)
Alpha 1: 0.2 g/dL (ref 0.0–0.4)
Alpha2 Glob SerPl Elph-Mcnc: 0.7 g/dL (ref 0.4–1.0)
B-Globulin SerPl Elph-Mcnc: 0.9 g/dL (ref 0.7–1.3)
Gamma Glob SerPl Elph-Mcnc: 3 g/dL — ABNORMAL HIGH (ref 0.4–1.8)
Globulin, Total: 4.9 g/dL — ABNORMAL HIGH (ref 2.2–3.9)
IgA: 150 mg/dL (ref 61–437)
IgG (Immunoglobin G), Serum: 3641 mg/dL — ABNORMAL HIGH (ref 603–1613)
IgM (Immunoglobulin M), Srm: 47 mg/dL (ref 20–172)
M Protein SerPl Elph-Mcnc: 2.6 g/dL — ABNORMAL HIGH
Total Protein ELP: 8.4 g/dL (ref 6.0–8.5)

## 2022-12-22 LAB — SURGICAL PATHOLOGY

## 2022-12-22 NOTE — Telephone Encounter (Signed)
The patient contacted [place] to verify their appointment details, including the scheduled time and location.

## 2022-12-23 ENCOUNTER — Ambulatory Visit
Admission: RE | Admit: 2022-12-23 | Discharge: 2022-12-23 | Disposition: A | Discharge status: Home / Self Care | Payer: Medicare Other | Source: Ambulatory Visit | Attending: Radiation Oncology | Admitting: Radiation Oncology

## 2022-12-23 ENCOUNTER — Encounter (HOSPITAL_COMMUNITY): Payer: Self-pay | Admitting: Oncology

## 2022-12-23 ENCOUNTER — Other Ambulatory Visit: Payer: Self-pay

## 2022-12-23 DIAGNOSIS — Z510 Encounter for antineoplastic radiation therapy: Secondary | ICD-10-CM | POA: Diagnosis not present

## 2022-12-23 LAB — RAD ONC ARIA SESSION SUMMARY
Course Elapsed Days: 5
Plan Fractions Treated to Date: 4
Plan Fractions Treated to Date: 4
Plan Prescribed Dose Per Fraction: 2 Gy
Plan Prescribed Dose Per Fraction: 3 Gy
Plan Total Fractions Prescribed: 10
Plan Total Fractions Prescribed: 10
Plan Total Prescribed Dose: 20 Gy
Plan Total Prescribed Dose: 30 Gy
Reference Point Dosage Given to Date: 12 Gy
Reference Point Dosage Given to Date: 8 Gy
Reference Point Session Dosage Given: 2 Gy
Reference Point Session Dosage Given: 3 Gy
Session Number: 4

## 2022-12-24 ENCOUNTER — Ambulatory Visit
Admission: RE | Admit: 2022-12-24 | Discharge: 2022-12-24 | Disposition: A | Discharge status: Home / Self Care | Payer: Medicare Other | Source: Ambulatory Visit | Attending: Radiation Oncology | Admitting: Radiation Oncology

## 2022-12-24 ENCOUNTER — Other Ambulatory Visit (HOSPITAL_COMMUNITY): Payer: Self-pay

## 2022-12-24 ENCOUNTER — Other Ambulatory Visit: Payer: Self-pay | Admitting: *Deleted

## 2022-12-24 ENCOUNTER — Telehealth: Payer: Self-pay | Admitting: Pharmacist

## 2022-12-24 ENCOUNTER — Telehealth: Payer: Self-pay | Admitting: Oncology

## 2022-12-24 ENCOUNTER — Other Ambulatory Visit: Payer: Self-pay

## 2022-12-24 ENCOUNTER — Encounter: Payer: Self-pay | Admitting: *Deleted

## 2022-12-24 ENCOUNTER — Telehealth: Payer: Self-pay

## 2022-12-24 ENCOUNTER — Inpatient Hospital Stay: Payer: Medicare Other | Attending: Oncology | Admitting: Oncology

## 2022-12-24 ENCOUNTER — Other Ambulatory Visit: Payer: Self-pay | Admitting: Oncology

## 2022-12-24 ENCOUNTER — Encounter: Payer: Self-pay | Admitting: Oncology

## 2022-12-24 VITALS — BP 150/81 | HR 80 | Temp 98.2°F | Resp 18 | Ht 70.0 in | Wt 248.8 lb

## 2022-12-24 DIAGNOSIS — N390 Urinary tract infection, site not specified: Secondary | ICD-10-CM | POA: Insufficient documentation

## 2022-12-24 DIAGNOSIS — Z510 Encounter for antineoplastic radiation therapy: Secondary | ICD-10-CM | POA: Diagnosis not present

## 2022-12-24 DIAGNOSIS — N202 Calculus of kidney with calculus of ureter: Secondary | ICD-10-CM | POA: Insufficient documentation

## 2022-12-24 DIAGNOSIS — G4730 Sleep apnea, unspecified: Secondary | ICD-10-CM | POA: Insufficient documentation

## 2022-12-24 DIAGNOSIS — J45909 Unspecified asthma, uncomplicated: Secondary | ICD-10-CM | POA: Insufficient documentation

## 2022-12-24 DIAGNOSIS — M25512 Pain in left shoulder: Secondary | ICD-10-CM | POA: Insufficient documentation

## 2022-12-24 DIAGNOSIS — C9000 Multiple myeloma not having achieved remission: Secondary | ICD-10-CM

## 2022-12-24 DIAGNOSIS — R21 Rash and other nonspecific skin eruption: Secondary | ICD-10-CM | POA: Insufficient documentation

## 2022-12-24 DIAGNOSIS — Z87440 Personal history of urinary (tract) infections: Secondary | ICD-10-CM | POA: Insufficient documentation

## 2022-12-24 DIAGNOSIS — I10 Essential (primary) hypertension: Secondary | ICD-10-CM | POA: Insufficient documentation

## 2022-12-24 DIAGNOSIS — R200 Anesthesia of skin: Secondary | ICD-10-CM | POA: Insufficient documentation

## 2022-12-24 DIAGNOSIS — Z5111 Encounter for antineoplastic chemotherapy: Secondary | ICD-10-CM | POA: Insufficient documentation

## 2022-12-24 DIAGNOSIS — E119 Type 2 diabetes mellitus without complications: Secondary | ICD-10-CM | POA: Insufficient documentation

## 2022-12-24 LAB — RAD ONC ARIA SESSION SUMMARY
Course Elapsed Days: 6
Plan Fractions Treated to Date: 5
Plan Fractions Treated to Date: 5
Plan Prescribed Dose Per Fraction: 2 Gy
Plan Prescribed Dose Per Fraction: 3 Gy
Plan Total Fractions Prescribed: 10
Plan Total Fractions Prescribed: 10
Plan Total Prescribed Dose: 20 Gy
Plan Total Prescribed Dose: 30 Gy
Reference Point Dosage Given to Date: 10 Gy
Reference Point Dosage Given to Date: 15 Gy
Reference Point Session Dosage Given: 2 Gy
Reference Point Session Dosage Given: 3 Gy
Session Number: 5

## 2022-12-24 MED ORDER — ACYCLOVIR 400 MG PO TABS: 400.0000 mg | ORAL_TABLET | Freq: Two times a day (BID) | ORAL | 5 refills | Status: DC

## 2022-12-24 MED ORDER — PROCHLORPERAZINE MALEATE 10 MG PO TABS: 10.0000 mg | ORAL_TABLET | Freq: Four times a day (QID) | ORAL | 1 refills | Status: AC | PRN

## 2022-12-24 MED ORDER — LENALIDOMIDE 25 MG PO CAPS
ORAL_CAPSULE | ORAL | 0 refills | Status: DC
  Filled 2022-12-24: qty 21, fill #0

## 2022-12-24 MED ORDER — LENALIDOMIDE 25 MG PO CAPS
25.0000 mg | ORAL_CAPSULE | Freq: Every day | ORAL | 0 refills | Status: DC
Start: 2022-12-24 — End: 2023-01-22

## 2022-12-24 NOTE — Telephone Encounter (Signed)
Oral Oncology Patient Advocate Encounter  Was successful in securing patient a $12,000 grant from [facility] to provide copayment coverage for Lenalidomide.  This will keep the out of pocket expense at $0.     [employer] ID: [id number]   The billing information is as follows and has been shared with [facility].    RxBin: [id number] PCN: [id number] Member ID: [id number] Group ID: [id number] Dates of Eligibility: 11/24/22 through 11/23/23  Fund:  Multiple Myeloma - Medicare Access   Ardeen Fillers, CPhT Oncology Pharmacy Patient Advocate  [facility]  [phone] (phone) [phone] (fax) 12/24/2022 2:29 PM

## 2022-12-24 NOTE — Progress Notes (Signed)
[facility] OFFICE PROGRESS NOTE   Diagnosis: Multiple myeloma  INTERVAL HISTORY:   Mr.Coaxum was discharged in the hospital 12/18/2022.  He is here today with his wife and daughter.  He began palliative radiation on 12/18/2022.  Left shoulder pain has resolved.  He continues to wear a neck brace.  He has persistent numbness at the left side of the chin/jaw.  He reports an episode of discomfort at the right low anterior chest yesterday.  This has improved today.  Objective:  Vital signs in last 24 hours:  Blood pressure (!) 150/81, pulse 80, temperature 98.2 F (36.8 C), temperature source Oral, resp. rate 18, height 5\' 10"  (1.778 m), weight 248 lb 12.8 oz (112.9 kg), SpO2 98 %.    Resp: Lungs clear bilaterally, decreased breath sounds at the right greater than left lower chest, no respiratory distress Cardio: Regular rate and rhythm GI: No hepatosplenomegaly Vascular: No leg edema Neuro: The motor exam appears intact in the upper and lower extremities bilaterally Musculoskeletal: No tenderness at the right chest wall.  Lab Results:  Lab Results  Component Value Date   WBC 6.7 12/18/2022   HGB 13.6 12/18/2022   HCT 41.0 12/18/2022   MCV 88.9 12/18/2022   PLT 173 12/18/2022   NEUTROABS 3.6 12/18/2022    CMP  Lab Results  Component Value Date   NA 140 12/16/2022   K 4.7 12/16/2022   CL 104 12/16/2022   CO2 25 12/16/2022   GLUCOSE 83 12/16/2022   BUN 25 (H) 12/16/2022   CREATININE 1.00 12/16/2022   CALCIUM 9.5 12/16/2022   PROT 7.9 06/19/2021   ALBUMIN 4.0 06/19/2021   AST 55 (H) 06/19/2021   ALT 80 (H) 06/19/2021   ALKPHOS 64 06/19/2021   BILITOT 0.4 06/19/2021   GFRNONAA >60 12/16/2022   GFRAA >90 08/02/2012     Medications: I have reviewed the patient's current medications.   Assessment/Plan: Multiple myeloma-IgG kappa 12/14/2022-SPEP-3.1 g M spike in the gamma region, 11/25/2022-x-ray left shoulder-lytic lesion in the medial aspect of the  humeral head and neck with cortical breakthrough CT lumbar spine 12/10/2022-scattered lucent lesions throughout the lumbar spine 12/16/2022-CT cervical spine-expansile destructive lesion at C3 12/17/2022-MRI brain-right 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 numerous enhancing lesions of the skull base and calvarium no evidence of extraosseous component 12/17/2022-MRI of the cervical, thoracic, and lumbar spine-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, clivus, bilateral occipital condyles, ribs, sacrum, and iliac bone, no evidence of pathologic fracture, expansile lesion in C3 extends into the spinal canal by 4 mm with moderate to severe spinal canal stenosis posterior to C3, mild right foraminal narrowing at T2-T3 and T3-4 related to osseous tumor at T3 12/17/2022-SPEP-2.6 g serum M spike, IgG kappa Bone marrow biopsy 12/18/2022-multiple myeloma with plasma cells involving 60% of the cellular marrow, kappa restricted myeloma FISH panel-QNS Bone survey 12/18/2022-lucencies at the skull, right scapula, left clavicle, lumbar spine, pelvis, and left femoral neck.  Destructive lesion at C3, possible sclerotic lucent lesions in the thoracic spine 12/18/2022 pelvic radiation to C3 and humeral head   2.  Left shoulder pain-likely secondary to a lytic lesion at the left humeral head/neck 3.  Left chin/distal mandible numbness-likely secondary to myeloma compressing nerve roots at the brainstem or left face 4.  Hypertension 5.  Diabetes 6.  Sleep apnea 7.  Nephrolithiasis-right ureteral calculus urinary tract infection, placement of a right ureter stent right stone dislodgment 10/01/2022 8.  Asthma  Disposition: Mr Fronheiser has been diagnosed with multiple myeloma.  The diagnosis of multiple myeloma was confirmed on a bone marrow biopsy last week.  He has multiple lytic bone lesions.  He is completing a course of palliative radiation to a destructive lesion at the C3  vertebra.  He is also receiving palliative radiation to the left humerus.  He will complete palliative radiation 12/31/2022.  I discussed the diagnosis of multiple myeloma, the prognosis, and treatment options.  He understands the high chance of obtaining a clinical remission with a multi agent systemic therapy regimen.  He appears to be a candidate for daratumumab-VRD.  I will make referral to the myeloma service at [facility] for second opinion and to consider the indication for stem cell therapy.  He is a Jehovah witness which may limit his ability to undergo stem cell therapy.  We reviewed potential toxicities associated with the daratumumab-VRD regimen.  We discussed the chance of nausea, diarrhea, rash, neuropathy, hematologic toxicity, infection, bleeding, and allergic reaction.  We discussed the respiratory toxicity associated with daratumumab.  We reviewed the mental status change, insomnia, cataracts, bone loss, and hyperglycemia seen with Decadron.  We discussed the risk of thromboembolic disease.  He will begin aspirin and acyclovir prophylaxis.  He has multiple lytic bone lesions including a destructive lesion at C3.  He will begin Zometa.  He will schedule an appointment with his dentist prior to beginning Zometa.  Mr Boilard attended a chemotherapy teaching class today.  The plan is to initiate cycle 1 daratumumab-VRD on 01/01/2023.  He will have a baseline CBC and chemistry panel and RBC phenotype on 01/01/2023.  The myeloma FISH panel returned QNS on the bone marrow sample 12/18/2022.  We will request FISH testing from the clot or biopsy specimen  A treatment plan was entered today.   Thornton Papas, MD  12/24/2022  9:54 AM

## 2022-12-24 NOTE — Progress Notes (Signed)
START ON PATHWAY REGIMEN - Multiple Myeloma and Other Plasma Cell Dyscrasias   DaraVRd (Daratumumab SUBQ + Bortezomib SUBQ Z6,1,09,60 + Lenalidomide PO D1-21 + Dexamethasone IV/PO A5,4,09,81) q28 Days (Induction Schema):   Cycles 1 and 2: A cycle is every 28 days:     Lenalidomide      Dexamethasone      Bortezomib      Daratumumab and hyaluronidase-fihj    Cycles 3 and 4: A cycle is every 28 days:     Lenalidomide      Dexamethasone      Bortezomib      Daratumumab and hyaluronidase-fihj    DaraVRd (Daratumumab SUBQ + Bortezomib SUBQ X9,1,47,82 + Lenalidomide PO D1-21 + Dexamethasone IV/PO N5,6,21,30) q28 Days (Consolidation Schema):   A cycle is every 28 days:     Lenalidomide      Dexamethasone      Bortezomib      Daratumumab and hyaluronidase-fihj   **Always confirm dose/schedule in your pharmacy ordering system**  Patient Characteristics: Multiple Myeloma, Newly Diagnosed, Transplant Eligible, Unknown or Awaiting Test Results Disease Classification: Multiple Myeloma Therapeutic Status: Newly Diagnosed R2-ISS Staging: Unknown Is Patient Eligible for Transplant<= Transplant Eligible Risk Status: Awaiting Test Results Intent of Therapy: Non-Curative / Palliative Intent, Discussed with Patient

## 2022-12-24 NOTE — Telephone Encounter (Addendum)
Oral Oncology Patient Advocate Encounter  After completing a benefits investigation, prior authorization for Lenalidomide is not required at this time through [employer].  Patient's copay is $3,890.29.    Patient now has [employer] Grant to bring co-pay to $0.00.   Ardeen Fillers, CPhT Oncology Pharmacy Patient Advocate  [facility]  [phone] (phone) [phone] (fax) 12/24/2022 2:20 PM

## 2022-12-24 NOTE — Telephone Encounter (Signed)
Oral Oncology Pharmacy Student Encounter  Received new prescription for lenalidomide for the treatment of newly diagnosed multiple myeloma, subtype kappa IgG in conjunction with dexamethasone, bortezomib, and daratumumab planned duration until disease control or unacceptable toxicity.  BMP, CBC with differential/platelet from 12/16/22 and 12/18/22 assessed, no related abnormalities. Prescription dose and frequency assessed -- Alyson will contact RN in regards to dosing frequency.   Current medication list in Epic reviewed, no significant DDIs with lenalidomide identified.  Evaluated chart and no significant patient barriers to medication adherence identified.   Prescription has been e-scribed to the [facility] for benefits analysis and approval.  Oral Oncology Clinic will continue to follow for insurance authorization, copayment issues, initial counseling and start date.  Seward Meth, PharmD Candidate PY1 [place]/DB/AP Oral Chemotherapy Navigation Clinic [phone]  12/24/2022 2:17 PM

## 2022-12-24 NOTE — Telephone Encounter (Signed)
Revlimid Rx sent to [facility]

## 2022-12-24 NOTE — Progress Notes (Signed)
PATIENT NAVIGATOR PROGRESS NOTE  Name: Jeffery Higgins Date: 12/24/2022 MRN: [id number]  DOB: [birth year]   Reason for visit:  New Patient appt  Comments:  Met with Mr and Mrs Woolson and their daughter during visit with Dr Truett Perna Referral made to Dr Westley Foots at [facility] at [phone] Request for [facility] panel from biopsy performed 12/18/22 [name] will contact his dentist in regard to starting Zometa therapy in the coming weeks Given education material on Bortezimib, Decadron, Daratumumab in [place] and [place] Given Journey book and MM specific information [name] will contact Dr Lovell Sheehan office for a F/U appt. Revlamid information and agreement reviewed and pt signed. [name] and family informed that his response to treatment will be monitored by his feeling of well being, blood protein, and eventually a bone marrow biopsy and future scans Given contact information to call with any issues or questions Referral made to nutrition Referral made to Social work    Time spent counseling/coordinating care: > 60 minutes

## 2022-12-25 ENCOUNTER — Telehealth: Payer: Self-pay

## 2022-12-25 ENCOUNTER — Other Ambulatory Visit: Payer: Self-pay

## 2022-12-25 ENCOUNTER — Ambulatory Visit
Admission: RE | Admit: 2022-12-25 | Discharge: 2022-12-25 | Disposition: A | Discharge status: Home / Self Care | Payer: Medicare Other | Source: Ambulatory Visit | Attending: Radiation Oncology | Admitting: Radiation Oncology

## 2022-12-25 DIAGNOSIS — Z510 Encounter for antineoplastic radiation therapy: Secondary | ICD-10-CM | POA: Diagnosis not present

## 2022-12-25 LAB — RAD ONC ARIA SESSION SUMMARY
Course Elapsed Days: 7
Plan Fractions Treated to Date: 6
Plan Fractions Treated to Date: 6
Plan Prescribed Dose Per Fraction: 2 Gy
Plan Prescribed Dose Per Fraction: 3 Gy
Plan Total Fractions Prescribed: 10
Plan Total Fractions Prescribed: 10
Plan Total Prescribed Dose: 20 Gy
Plan Total Prescribed Dose: 30 Gy
Reference Point Dosage Given to Date: 12 Gy
Reference Point Dosage Given to Date: 18 Gy
Reference Point Session Dosage Given: 2 Gy
Reference Point Session Dosage Given: 3 Gy
Session Number: 6

## 2022-12-25 NOTE — Telephone Encounter (Signed)
Oral Chemotherapy Pharmacist Encounter  Patient knows his lenalidomide rx was sent to [facility].   Patient Education I spoke with patient for overview of new oral chemotherapy medication: lenalidomide for the treatment of newly diagnosed multiple myeloma, subtype kappa IgG in conjunction with dexamethasone, bortezomib, and daratumumab planned duration until disease control or unacceptable toxicity.   Counseled patient on administration, dosing, side effects, monitoring, drug-food interactions, safe handling, storage, and disposal. Patient will take 1 capsule (25 mg total) by mouth daily. Take for 21 days, then hold for 7 days. Repeat every 28 days.   Side effects include but not limited to: rash/itchy skin, N/V, fatigue, decreased wbc/hgb/plt, constipation or diarrhea.    Reviewed with patient importance of keeping a medication schedule and plan for any missed doses.  After discussion with patient no patient barriers to medication adherence identified.   Jeffery Higgins voiced understanding and appreciation. All questions answered. Medication handout provided.  Provided patient with Oral Chemotherapy Navigation Clinic phone number. Patient knows to call the office with questions or concerns. Oral Chemotherapy Navigation Clinic will continue to follow.  Remi Haggard, PharmD, BCPS, BCOP, CPP Hematology/Oncology Clinical Pharmacist Practitioner [place]/DB/AP Oral Chemotherapy Navigation Clinic [phone]  12/25/2022 9:02 AM

## 2022-12-25 NOTE — Telephone Encounter (Signed)
CSW received referral from Nurse Navigator per new patient protocol.  Patient's vm was full and CSW was unable to leave a message.  Will attempt to contact again at a later time.

## 2022-12-28 ENCOUNTER — Telehealth: Payer: Self-pay | Admitting: *Deleted

## 2022-12-28 ENCOUNTER — Encounter: Payer: Self-pay | Admitting: *Deleted

## 2022-12-28 ENCOUNTER — Ambulatory Visit
Admission: RE | Admit: 2022-12-28 | Discharge: 2022-12-28 | Disposition: A | Discharge status: Home / Self Care | Payer: Medicare Other | Source: Ambulatory Visit | Attending: Radiation Oncology | Admitting: Radiation Oncology

## 2022-12-28 ENCOUNTER — Ambulatory Visit: Payer: Medicare Other

## 2022-12-28 ENCOUNTER — Other Ambulatory Visit: Payer: Self-pay

## 2022-12-28 ENCOUNTER — Inpatient Hospital Stay: Payer: Medicare Other

## 2022-12-28 DIAGNOSIS — Z510 Encounter for antineoplastic radiation therapy: Secondary | ICD-10-CM | POA: Diagnosis not present

## 2022-12-28 LAB — RAD ONC ARIA SESSION SUMMARY
Course Elapsed Days: 10
Plan Fractions Treated to Date: 7
Plan Fractions Treated to Date: 7
Plan Prescribed Dose Per Fraction: 2 Gy
Plan Prescribed Dose Per Fraction: 3 Gy
Plan Total Fractions Prescribed: 10
Plan Total Fractions Prescribed: 10
Plan Total Prescribed Dose: 20 Gy
Plan Total Prescribed Dose: 30 Gy
Reference Point Dosage Given to Date: 14 Gy
Reference Point Dosage Given to Date: 21 Gy
Reference Point Session Dosage Given: 2 Gy
Reference Point Session Dosage Given: 3 Gy
Session Number: 7

## 2022-12-28 NOTE — Progress Notes (Signed)
[facility] Clinical Social Work  Initial Assessment   Jeffery Higgins is a 67 y.o. year old male contacted by phone. Clinical Social Work was referred by nurse navigator for assessment of psychosocial needs.   SDOH (Social Determinants of Health) assessments performed: Yes   SDOH Screenings   Food Insecurity: No Food Insecurity (12/17/2022)  Housing: Low Risk  (12/17/2022)  Transportation Needs: No Transportation Needs (12/17/2022)  Utilities: Not At Risk (12/17/2022)  Tobacco Use: Low Risk  (12/17/2022)     Distress Screen completed: No     No data to display            Family/Social Information:  Housing Arrangement: patient lives with hi wife, Jeffery Higgins.   Patient has lived in [place] for over twenty years.  He has two daughters. Family members/support persons in your life? Family, Friends, and [employer] Financial concerns: no  Employment: Retired on October 25, 2022.  He worked at [employer]. 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 yes Concerns about diagnosis and/or treatment: Losing my job and/or losing income Patient reported stressors: Therapist, art and/or priorities: Family Patient enjoys  Raises Koi fish. Current coping skills/ strengths: Active sense of humor , Capable of independent living , Communication skills , General fund of knowledge , Motivation for treatment/growth , Religious Affiliation , and Supportive family/friends     SUMMARY: Current SDOH Barriers:  Financial constraints related to fixed income.  Clinical Social Work Clinical Goal(s):  Explore community resource options for unmet needs related to:  Financial Strain    Interventions: Discussed common feeling and emotions when being diagnosed with cancer, and the importance of support during treatment Informed patient of the support team roles and support services at [facility] Provided CSW contact information and encouraged patient to call with any questions or concerns Provided patient with information about the [employer] and the [employer].  Patient stated paying for gas for treatment is a strain.  CSW to give him four [employer] today.  Also made a referral to Bella Kennedy for the [employer]    Provided information on the Leukemia and Lymphoma Society.   Follow Up Plan: CSW will see patient on 12/28/22 in the Radiology waiting room at 4pm. Patient verbalizes understanding of plan: Yes    Dorothey Baseman, LCSW Clinical Social Worker [facility]

## 2022-12-28 NOTE — Telephone Encounter (Signed)
Opened in error

## 2022-12-28 NOTE — Progress Notes (Signed)
PATIENT NAVIGATOR PROGRESS NOTE  Name: Rashaud Frederique Date: 12/28/2022 MRN: [id number]  DOB: [birth year]   Reason for visit:  Telephone call to answer questions regarding starting tx  Comments:  Discussed multiple questions with Mr Ralpheal Surgener Revlamid this Friday with Day 1 therapy and take for 21 days then 7 days off before starting next cycle Emailed dental clearance form to his dentist at [email or web address]. He has an appt tomorrow for dental clearance Discussed having f/u appt with neurosurgeon Dr Lovell Sheehan regarding collar wearing recommendations Request that his nutrition appt this week be held at [facility] since he is there everyday until Thursday for radiation tx, message sent to dietician for reschedule.     Time spent counseling/coordinating care: 45-60 minutes

## 2022-12-28 NOTE — Progress Notes (Signed)
[facility] CSW Progress Note  Visual merchandiser met with patient to provide the four [employer] he was eligible for.  Provided [employer], support group information and [employer] and [employer].  Gave patient the Leukemia and Lymphoma Society $100 grant form to sign and mail also.    Dorothey Baseman, LCSW Clinical Social Worker [facility]

## 2022-12-29 ENCOUNTER — Encounter (HOSPITAL_COMMUNITY): Payer: Self-pay | Admitting: Oncology

## 2022-12-29 ENCOUNTER — Ambulatory Visit
Admission: RE | Admit: 2022-12-29 | Discharge: 2022-12-29 | Disposition: A | Discharge status: Home / Self Care | Payer: Medicare Other | Source: Ambulatory Visit | Attending: Radiation Oncology | Admitting: Radiation Oncology

## 2022-12-29 ENCOUNTER — Other Ambulatory Visit: Payer: Self-pay

## 2022-12-29 DIAGNOSIS — Z510 Encounter for antineoplastic radiation therapy: Secondary | ICD-10-CM | POA: Diagnosis not present

## 2022-12-29 LAB — RAD ONC ARIA SESSION SUMMARY
Course Elapsed Days: 11
Plan Fractions Treated to Date: 8
Plan Fractions Treated to Date: 8
Plan Prescribed Dose Per Fraction: 2 Gy
Plan Prescribed Dose Per Fraction: 3 Gy
Plan Total Fractions Prescribed: 10
Plan Total Fractions Prescribed: 10
Plan Total Prescribed Dose: 20 Gy
Plan Total Prescribed Dose: 30 Gy
Reference Point Dosage Given to Date: 16 Gy
Reference Point Dosage Given to Date: 24 Gy
Reference Point Session Dosage Given: 2 Gy
Reference Point Session Dosage Given: 3 Gy
Session Number: 8

## 2022-12-30 ENCOUNTER — Other Ambulatory Visit: Payer: Self-pay

## 2022-12-30 ENCOUNTER — Ambulatory Visit
Admission: RE | Admit: 2022-12-30 | Discharge: 2022-12-30 | Disposition: A | Discharge status: Home / Self Care | Payer: Medicare Other | Source: Ambulatory Visit | Attending: Radiation Oncology | Admitting: Radiation Oncology

## 2022-12-30 ENCOUNTER — Inpatient Hospital Stay: Payer: Medicare Other | Admitting: Nutrition

## 2022-12-30 DIAGNOSIS — Z510 Encounter for antineoplastic radiation therapy: Secondary | ICD-10-CM | POA: Diagnosis not present

## 2022-12-30 LAB — RAD ONC ARIA SESSION SUMMARY
Course Elapsed Days: 12
Plan Fractions Treated to Date: 9
Plan Fractions Treated to Date: 9
Plan Prescribed Dose Per Fraction: 2 Gy
Plan Prescribed Dose Per Fraction: 3 Gy
Plan Total Fractions Prescribed: 10
Plan Total Fractions Prescribed: 10
Plan Total Prescribed Dose: 20 Gy
Plan Total Prescribed Dose: 30 Gy
Reference Point Dosage Given to Date: 18 Gy
Reference Point Dosage Given to Date: 27 Gy
Reference Point Session Dosage Given: 2 Gy
Reference Point Session Dosage Given: 3 Gy
Session Number: 9

## 2022-12-31 ENCOUNTER — Other Ambulatory Visit: Payer: Self-pay

## 2022-12-31 ENCOUNTER — Ambulatory Visit
Admission: RE | Admit: 2022-12-31 | Discharge: 2022-12-31 | Disposition: A | Discharge status: Home / Self Care | Payer: Medicare Other | Source: Ambulatory Visit | Attending: Radiation Oncology | Admitting: Radiation Oncology

## 2022-12-31 DIAGNOSIS — Z510 Encounter for antineoplastic radiation therapy: Secondary | ICD-10-CM | POA: Diagnosis not present

## 2022-12-31 LAB — RAD ONC ARIA SESSION SUMMARY
Course Elapsed Days: 13
Plan Fractions Treated to Date: 10
Plan Fractions Treated to Date: 10
Plan Prescribed Dose Per Fraction: 2 Gy
Plan Prescribed Dose Per Fraction: 3 Gy
Plan Total Fractions Prescribed: 10
Plan Total Fractions Prescribed: 10
Plan Total Prescribed Dose: 20 Gy
Plan Total Prescribed Dose: 30 Gy
Reference Point Dosage Given to Date: 20 Gy
Reference Point Dosage Given to Date: 30 Gy
Reference Point Session Dosage Given: 2 Gy
Reference Point Session Dosage Given: 3 Gy
Session Number: 10

## 2023-01-01 ENCOUNTER — Inpatient Hospital Stay: Payer: Medicare Other

## 2023-01-01 VITALS — BP 144/74 | HR 72 | Temp 98.2°F | Resp 18 | Ht 70.0 in | Wt 251.2 lb

## 2023-01-01 DIAGNOSIS — C9000 Multiple myeloma not having achieved remission: Secondary | ICD-10-CM | POA: Insufficient documentation

## 2023-01-01 DIAGNOSIS — G4730 Sleep apnea, unspecified: Secondary | ICD-10-CM | POA: Diagnosis not present

## 2023-01-01 DIAGNOSIS — N202 Calculus of kidney with calculus of ureter: Secondary | ICD-10-CM | POA: Insufficient documentation

## 2023-01-01 DIAGNOSIS — R21 Rash and other nonspecific skin eruption: Secondary | ICD-10-CM | POA: Diagnosis not present

## 2023-01-01 DIAGNOSIS — Z87440 Personal history of urinary (tract) infections: Secondary | ICD-10-CM | POA: Insufficient documentation

## 2023-01-01 DIAGNOSIS — I10 Essential (primary) hypertension: Secondary | ICD-10-CM | POA: Diagnosis not present

## 2023-01-01 DIAGNOSIS — Z5111 Encounter for antineoplastic chemotherapy: Secondary | ICD-10-CM | POA: Insufficient documentation

## 2023-01-01 DIAGNOSIS — M25512 Pain in left shoulder: Secondary | ICD-10-CM | POA: Insufficient documentation

## 2023-01-01 DIAGNOSIS — J45909 Unspecified asthma, uncomplicated: Secondary | ICD-10-CM | POA: Insufficient documentation

## 2023-01-01 DIAGNOSIS — N390 Urinary tract infection, site not specified: Secondary | ICD-10-CM | POA: Diagnosis not present

## 2023-01-01 DIAGNOSIS — R200 Anesthesia of skin: Secondary | ICD-10-CM | POA: Insufficient documentation

## 2023-01-01 DIAGNOSIS — E119 Type 2 diabetes mellitus without complications: Secondary | ICD-10-CM | POA: Insufficient documentation

## 2023-01-01 LAB — CBC WITH DIFFERENTIAL (CANCER CENTER ONLY)
Abs Immature Granulocytes: 0.02 10*3/uL (ref 0.00–0.07)
Basophils Absolute: 0.1 10*3/uL (ref 0.0–0.1)
Basophils Relative: 1 %
Eosinophils Absolute: 0.3 10*3/uL (ref 0.0–0.5)
Eosinophils Relative: 4 %
HCT: 41.7 % (ref 39.0–52.0)
Hemoglobin: 13.5 g/dL (ref 13.0–17.0)
Immature Granulocytes: 0 %
Lymphocytes Relative: 14 %
Lymphs Abs: 1 10*3/uL (ref 0.7–4.0)
MCH: 29.3 pg (ref 26.0–34.0)
MCHC: 32.4 g/dL (ref 30.0–36.0)
MCV: 90.7 fL (ref 80.0–100.0)
Monocytes Absolute: 0.6 10*3/uL (ref 0.1–1.0)
Monocytes Relative: 8 %
Neutro Abs: 5.1 10*3/uL (ref 1.7–7.7)
Neutrophils Relative %: 73 %
Platelet Count: 225 10*3/uL (ref 150–400)
RBC: 4.6 MIL/uL (ref 4.22–5.81)
RDW: 15 % (ref 11.5–15.5)
WBC Count: 7.1 10*3/uL (ref 4.0–10.5)
nRBC: 0 % (ref 0.0–0.2)

## 2023-01-01 LAB — CMP (CANCER CENTER ONLY)
ALT: 9 U/L (ref 0–44)
AST: 12 U/L — ABNORMAL LOW (ref 15–41)
Albumin: 3.6 g/dL (ref 3.5–5.0)
Alkaline Phosphatase: 65 U/L (ref 38–126)
Anion gap: 4 — ABNORMAL LOW (ref 5–15)
BUN: 18 mg/dL (ref 8–23)
CO2: 30 mmol/L (ref 22–32)
Calcium: 9.5 mg/dL (ref 8.9–10.3)
Chloride: 104 mmol/L (ref 98–111)
Creatinine: 1.06 mg/dL (ref 0.61–1.24)
GFR, Estimated: >60 mL/min (ref >60)
Glucose, Bld: 136 mg/dL — ABNORMAL HIGH (ref 70–99)
Potassium: 4.1 mmol/L (ref 3.5–5.1)
Sodium: 138 mmol/L (ref 135–145)
Total Bilirubin: 0.4 mg/dL (ref 0.3–1.2)
Total Protein: 9.3 g/dL — ABNORMAL HIGH (ref 6.5–8.1)

## 2023-01-01 LAB — TYPE AND SCREEN
ABO/RH(D): O NEG
Antibody Screen: NEGATIVE

## 2023-01-01 MED ORDER — BORTEZOMIB CHEMO SQ INJECTION 3.5 MG (2.5MG/ML)
1.3000 mg/m2 | Freq: Once | INTRAMUSCULAR | Status: AC
  Administered 2023-01-01: 3 mg via SUBCUTANEOUS
  Filled 2023-01-01: qty 1.2

## 2023-01-01 MED ORDER — DARATUMUMAB-HYALURONIDASE-FIHJ 1800-30000 MG-UT/15ML ~~LOC~~ SOLN
1800.0000 mg | Freq: Once | SUBCUTANEOUS | Status: AC
  Administered 2023-01-01: 1800 mg via SUBCUTANEOUS
  Filled 2023-01-01: qty 15

## 2023-01-01 MED ORDER — ACETAMINOPHEN 325 MG PO TABS
650.0000 mg | ORAL_TABLET | Freq: Once | ORAL | Status: AC
  Administered 2023-01-01: 650 mg via ORAL
  Filled 2023-01-01: qty 2

## 2023-01-01 MED ORDER — SODIUM CHLORIDE 0.9 % IV SOLN: Freq: Once | INTRAVENOUS | Status: AC

## 2023-01-01 MED ORDER — ZOLEDRONIC ACID 4 MG/100ML IV SOLN
4.0000 mg | Freq: Once | INTRAVENOUS | Status: AC
  Administered 2023-01-01: 4 mg via INTRAVENOUS
  Filled 2023-01-01: qty 100

## 2023-01-01 MED ORDER — DEXAMETHASONE 4 MG PO TABS
20.0000 mg | ORAL_TABLET | Freq: Once | ORAL | Status: AC
  Administered 2023-01-01: 20 mg via ORAL
  Filled 2023-01-01: qty 5

## 2023-01-01 MED ORDER — DIPHENHYDRAMINE HCL 25 MG PO CAPS
50.0000 mg | ORAL_CAPSULE | Freq: Once | ORAL | Status: AC
  Administered 2023-01-01: 50 mg via ORAL
  Filled 2023-01-01: qty 2

## 2023-01-01 MED ORDER — MONTELUKAST SODIUM 10 MG PO TABS
10.0000 mg | ORAL_TABLET | Freq: Once | ORAL | Status: AC
  Administered 2023-01-01: 10 mg via ORAL
  Filled 2023-01-01: qty 1

## 2023-01-01 NOTE — Patient Instructions (Addendum)
[facility]   Discharge Instructions: Thank you for choosing [facility] to provide your oncology and hematology care.   If you have a lab appointment with the Cancer Center, please go directly to the Cancer Center and check in at the registration area.   Wear comfortable clothing and clothing appropriate for easy access to any Portacath or PICC line.   We strive to give you quality time with your provider. You may need to reschedule your appointment if you arrive late (15 or more minutes).  Arriving late affects you and other patients whose appointments are after yours.  Also, if you miss three or more appointments without notifying the office, you may be dismissed from the clinic at the provider's discretion.      For prescription refill requests, have your pharmacy contact our office and allow 72 hours for refills to be completed.    Today you received the following chemotherapy and/or immunotherapy agents Bortezomib (VELCADE) & Daratumumab-hyaluronidase-fihj (DARZALEX FASPRO).      To help prevent nausea and vomiting after your treatment, we encourage you to take your nausea medication as directed.  BELOW ARE SYMPTOMS THAT SHOULD BE REPORTED IMMEDIATELY: *FEVER GREATER THAN 100.4 F (38 C) OR HIGHER *CHILLS OR SWEATING *NAUSEA AND VOMITING THAT IS NOT CONTROLLED WITH YOUR NAUSEA MEDICATION *UNUSUAL SHORTNESS OF BREATH *UNUSUAL BRUISING OR BLEEDING *URINARY PROBLEMS (pain or burning when urinating, or frequent urination) *BOWEL PROBLEMS (unusual diarrhea, constipation, pain near the anus) TENDERNESS IN MOUTH AND THROAT WITH OR WITHOUT PRESENCE OF ULCERS (sore throat, sores in mouth, or a toothache) UNUSUAL RASH, SWELLING OR PAIN  UNUSUAL VAGINAL DISCHARGE OR ITCHING   Items with * indicate a potential emergency and should be followed up as soon as possible or go to the Emergency Department if any problems should occur.  Please show  the CHEMOTHERAPY ALERT CARD or IMMUNOTHERAPY ALERT CARD at check-in to the Emergency Department and triage nurse.  Should you have questions after your visit or need to cancel or reschedule your appointment, please contact [facility]  Dept: [phone]  and follow the prompts.  Office hours are 8:00 a.m. to 4:30 p.m. Monday - Friday. Please note that voicemails left after 4:00 p.m. may not be returned until the following business day.  We are closed weekends and major holidays. You have access to a nurse at all times for urgent questions. Please call the main number to the clinic Dept: [phone] and follow the prompts.   For any non-urgent questions, you may also contact your provider using MyChart. We now offer e-Visits for anyone 56 and older to request care online for non-urgent symptoms. For details visit [email or web address].   Also download the MyChart app! Go to the app store, search "MyChart", open the app, select [place], and log in with your MyChart username and password.  Bortezomib Injection What is this medication? BORTEZOMIB (bor TEZ oh mib) treats lymphoma. It may also be used to treat multiple myeloma, a type of bone marrow cancer. It works by blocking a protein that causes cancer cells to grow and multiply. This helps to slow or stop the spread of cancer cells. This medicine may be used for other purposes; ask your health care provider or pharmacist if you have questions. COMMON BRAND NAME(S): Velcade What should I tell my care team before I take this medication? They need to know if you have any of these conditions: Dehydration Diabetes Heart  disease Liver disease Tingling of the fingers or toes or other nerve disorder An unusual or allergic reaction to bortezomib, other medications, foods, dyes, or preservatives If you or your partner are pregnant or trying to get pregnant Breastfeeding How should I use this medication? This  medication is injected into a vein or under the skin. It is given by your care team in a hospital or clinic setting. Talk to your care team about the use of this medication in children. Special care may be needed. Overdosage: If you think you have taken too much of this medicine contact a poison control center or emergency room at once. NOTE: This medicine is only for you. Do not share this medicine with others. What if I miss a dose? Keep appointments for follow-up doses. It is important not to miss your dose. Call your care team if you are unable to keep an appointment. What may interact with this medication? Ketoconazole Rifampin This list may not describe all possible interactions. Give your health care provider a list of all the medicines, herbs, non-prescription drugs, or dietary supplements you use. Also tell them if you smoke, drink alcohol, or use illegal drugs. Some items may interact with your medicine. What should I watch for while using this medication? Your condition will be monitored carefully while you are receiving this medication. You may need blood work while taking this medication. This medication may affect your coordination, reaction time, or judgment. Do not drive or operate machinery until you know how this medication affects you. Sit up or stand slowly to reduce the risk of dizzy or fainting spells. Drinking alcohol with this medication can increase the risk of these side effects. This medication may increase your risk of getting an infection. Call your care team for advice if you get a fever, chills, sore throat, or other symptoms of a cold or flu. Do not treat yourself. Try to avoid being around people who are sick. Check with your care team if you have severe diarrhea, nausea, and vomiting, or if you sweat a lot. The loss of too much body fluid may make it dangerous for you to take this medication. Talk to your care team if you may be pregnant. Serious birth defects can  occur if you take this medication during pregnancy and for 7 months after the last dose. You will need a negative pregnancy test before starting this medication. Contraception is recommended while taking this medication and for 7 months after the last dose. Your care team can help you find the option that works for you. If your partner can get pregnant, use a condom during sex while taking this medication and for 4 months after the last dose. Do not breastfeed while taking this medication and for 2 months after the last dose. This medication may cause infertility. Talk to your care team if you are concerned about your fertility. What side effects may I notice from receiving this medication? Side effects that you should report to your care team as soon as possible: Allergic reactions--skin rash, itching, hives, swelling of the face, lips, tongue, or throat Bleeding--bloody or black, tar-like stools, vomiting blood or brown material that looks like coffee grounds, red or dark brown urine, small red or purple spots on skin, unusual bruising or bleeding Bleeding in the brain--severe headache, stiff neck, confusion, dizziness, change in vision, numbness or weakness of the face, arm, or leg, trouble speaking, trouble walking, vomiting Bowel blockage--stomach cramping, unable to have a bowel movement or  pass gas, loss of appetite, vomiting Heart failure--shortness of breath, swelling of the ankles, feet, or hands, sudden weight gain, unusual weakness or fatigue Infection--fever, chills, cough, sore throat, wounds that don't heal, pain or trouble when passing urine, general feeling of discomfort or being unwell Liver injury--right upper belly pain, loss of appetite, nausea, light-colored stool, dark yellow or brown urine, yellowing skin or eyes, unusual weakness or fatigue Low blood pressure--dizziness, feeling faint or lightheaded, blurry vision Lung injury--shortness of breath or trouble breathing, cough,  spitting up blood, chest pain, fever Pain, tingling, or numbness in the hands or feet Severe or prolonged diarrhea Stomach pain, bloody diarrhea, pale skin, unusual weakness or fatigue, decrease in the amount of urine, which may be signs of hemolytic uremic syndrome Sudden and severe headache, confusion, change in vision, seizures, which may be signs of posterior reversible encephalopathy syndrome (PRES) TTP--purple spots on the skin or inside the mouth, pale skin, yellowing skin or eyes, unusual weakness or fatigue, fever, fast or irregular heartbeat, confusion, change in vision, trouble speaking, trouble walking Tumor lysis syndrome (TLS)--nausea, vomiting, diarrhea, decrease in the amount of urine, dark urine, unusual weakness or fatigue, confusion, muscle pain or cramps, fast or irregular heartbeat, joint pain Side effects that usually do not require medical attention (report to your care team if they continue or are bothersome): Constipation Diarrhea Fatigue Loss of appetite Nausea This list may not describe all possible side effects. Call your doctor for medical advice about side effects. You may report side effects to FDA at 1-800-FDA-1088. Where should I keep my medication? This medication is given in a hospital or clinic. It will not be stored at home. NOTE: This sheet is a summary. It may not cover all possible information. If you have questions about this medicine, talk to your doctor, pharmacist, or health care provider.  2023 Elsevier/Gold Standard (2021-12-31 00:00:00)  Daratumumab; Hyaluronidase Injection What is this medication? DARATUMUMAB; HYALURONIDASE (dar a toom ue mab; hye al ur ON i dase) treats multiple myeloma, a type of bone marrow cancer. Daratumumab works by blocking a protein that causes cancer cells to grow and multiply. This helps to slow or stop the spread of cancer cells. Hyaluronidase works by increasing the absorption of other medications in the body to help  them work better. This medication may also be used treat amyloidosis, a condition that causes the buildup of a protein (amyloid) in your body. It works by reducing the buildup of this protein, which decreases symptoms. It is a combination medication that contains a monoclonal antibody. This medicine may be used for other purposes; ask your health care provider or pharmacist if you have questions. COMMON BRAND NAME(S): DARZALEX FASPRO What should I tell my care team before I take this medication? They need to know if you have any of these conditions: Heart disease Infection, such as chickenpox, cold sores, herpes, hepatitis B Lung or breathing disease An unusual or allergic reaction to daratumumab, hyaluronidase, other medications, foods, dyes, or preservatives Pregnant or trying to get pregnant Breast-feeding How should I use this medication? This medication is injected under the skin. It is given by your care team in a hospital or clinic setting. Talk to your care team about the use of this medication in children. Special care may be needed. Overdosage: If you think you have taken too much of this medicine contact a poison control center or emergency room at once. NOTE: This medicine is only for you. Do not share this medicine with  others. What if I miss a dose? Keep appointments for follow-up doses. It is important not to miss your dose. Call your care team if you are unable to keep an appointment. What may interact with this medication? Interactions have not been studied. This list may not describe all possible interactions. Give your health care provider a list of all the medicines, herbs, non-prescription drugs, or dietary supplements you use. Also tell them if you smoke, drink alcohol, or use illegal drugs. Some items may interact with your medicine. What should I watch for while using this medication? Your condition will be monitored carefully while you are receiving this  medication. This medication can cause serious allergic reactions. To reduce your risk, your care team may give you other medication to take before receiving this one. Be sure to follow the directions from your care team. This medication can affect the results of blood tests to match your blood type. These changes can last for up to 6 months after the final dose. Your care team will do blood tests to match your blood type before you start treatment. Tell all of your care team that you are being treated with this medication before receiving a blood transfusion. This medication can affect the results of some tests used to determine treatment response; extra tests may be needed to evaluate response. Talk to your care team if you wish to become pregnant or think you are pregnant. This medication can cause serious birth defects if taken during pregnancy and for 3 months after the last dose. A reliable form of contraception is recommended while taking this medication and for 3 months after the last dose. Talk to your care team about effective forms of contraception. Do not breast-feed while taking this medication. What side effects may I notice from receiving this medication? Side effects that you should report to your care team as soon as possible: Allergic reactions--skin rash, itching, hives, swelling of the face, lips, tongue, or throat Heart rhythm changes--fast or irregular heartbeat, dizziness, feeling faint or lightheaded, chest pain, trouble breathing Infection--fever, chills, cough, sore throat, wounds that don't heal, pain or trouble when passing urine, general feeling of discomfort or being unwell Infusion reactions--chest pain, shortness of breath or trouble breathing, feeling faint or lightheaded Sudden eye pain or change in vision such as blurry vision, seeing halos around lights, vision loss Unusual bruising or bleeding Side effects that usually do not require medical attention (report to your  care team if they continue or are bothersome): Constipation Diarrhea Fatigue Nausea Pain, tingling, or numbness in the hands or feet Swelling of the ankles, hands, or feet This list may not describe all possible side effects. Call your doctor for medical advice about side effects. You may report side effects to FDA at 1-800-FDA-1088. Where should I keep my medication? This medication is given in a hospital or clinic. It will not be stored at home. NOTE: This sheet is a summary. It may not cover all possible information. If you have questions about this medicine, talk to your doctor, pharmacist, or health care provider.  2023 Elsevier/Gold Standard (2021-11-26 00:00:00)  Zoledronic Acid Injection (Cancer) What is this medication? ZOLEDRONIC ACID (ZOE le dron ik AS id) treats high calcium levels in the blood caused by cancer. It may also be used with chemotherapy to treat weakened bones caused by cancer. It works by slowing down the release of calcium from bones. This lowers calcium levels in your blood. It also makes your bones stronger and  less likely to break (fracture). It belongs to a group of medications called bisphosphonates. This medicine may be used for other purposes; ask your health care provider or pharmacist if you have questions. COMMON BRAND NAME(S): Zometa, Zometa Powder What should I tell my care team before I take this medication? They need to know if you have any of these conditions: Dehydration Dental disease Kidney disease Liver disease Low levels of calcium in the blood Lung or breathing disease, such as asthma Receiving steroids, such as dexamethasone or prednisone An unusual or allergic reaction to zoledronic acid, other medications, foods, dyes, or preservatives Pregnant or trying to get pregnant Breast-feeding How should I use this medication? This medication is injected into a vein. It is given by your care team in a hospital or clinic setting. Talk to your  care team about the use of this medication in children. Special care may be needed. Overdosage: If you think you have taken too much of this medicine contact a poison control center or emergency room at once. NOTE: This medicine is only for you. Do not share this medicine with others. What if I miss a dose? Keep appointments for follow-up doses. It is important not to miss your dose. Call your care team if you are unable to keep an appointment. What may interact with this medication? Certain antibiotics given by injection Diuretics, such as bumetanide, furosemide NSAIDs, medications for pain and inflammation, such as ibuprofen or naproxen Teriparatide Thalidomide This list may not describe all possible interactions. Give your health care provider a list of all the medicines, herbs, non-prescription drugs, or dietary supplements you use. Also tell them if you smoke, drink alcohol, or use illegal drugs. Some items may interact with your medicine. What should I watch for while using this medication? Visit your care team for regular checks on your progress. It may be some time before you see the benefit from this medication. Some people who take this medication have severe bone, joint, or muscle pain. This medication may also increase your risk for jaw problems or a broken thigh bone. Tell your care team right away if you have severe pain in your jaw, bones, joints, or muscles. Tell you care team if you have any pain that does not go away or that gets worse. Tell your dentist and dental surgeon that you are taking this medication. You should not have major dental surgery while on this medication. See your dentist to have a dental exam and fix any dental problems before starting this medication. Take good care of your teeth while on this medication. Make sure you see your dentist for regular follow-up appointments. You should make sure you get enough calcium and vitamin D while you are taking this  medication. Discuss the foods you eat and the vitamins you take with your care team. Check with your care team if you have severe diarrhea, nausea, and vomiting, or if you sweat a lot. The loss of too much body fluid may make it dangerous for you to take this medication. You may need bloodwork while taking this medication. Talk to your care team if you wish to become pregnant or think you might be pregnant. This medication can cause serious birth defects. What side effects may I notice from receiving this medication? Side effects that you should report to your care team as soon as possible: Allergic reactions--skin rash, itching, hives, swelling of the face, lips, tongue, or throat Kidney injury--decrease in the amount of urine, swelling of  the ankles, hands, or feet Low calcium level--muscle pain or cramps, confusion, tingling, or numbness in the hands or feet Osteonecrosis of the jaw--pain, swelling, or redness in the mouth, numbness of the jaw, poor healing after dental work, unusual discharge from the mouth, visible bones in the mouth Severe bone, joint, or muscle pain Side effects that usually do not require medical attention (report to your care team if they continue or are bothersome): Constipation Fatigue Fever Loss of appetite Nausea Stomach pain This list may not describe all possible side effects. Call your doctor for medical advice about side effects. You may report side effects to FDA at 1-800-FDA-1088. Where should I keep my medication? This medication is given in a hospital or clinic. It will not be stored at home. NOTE: This sheet is a summary. It may not cover all possible information. If you have questions about this medicine, talk to your doctor, pharmacist, or health care provider.  2023 Elsevier/Gold Standard (2007-09-24 00:00:00)

## 2023-01-02 LAB — PRETREATMENT RBC PHENOTYPE: ABO/RH(D): O NEG

## 2023-01-04 ENCOUNTER — Telehealth: Payer: Self-pay

## 2023-01-04 NOTE — Telephone Encounter (Signed)
NEW CHEMO FOLLOW UP  Telephone call to patient post his first time Velcade/DaraFaspro on Friday. Patient reports having some chills and feverishness that lasted about 3 hours. He took some Tylenol which helped. He denied any nausea. He verbalized feeling better today with no concerns. He knows to call the clinic if this changes before his next visit and he verbalized understanding.

## 2023-01-05 NOTE — Radiation Completion Notes (Addendum)
  Radiation Oncology         [phone] ________________________________  Name: [name] MRN: [id number]  Date: 12/31/2022  DOB: August 21, 1955  End of Treatment Note  Patient Name: Jeffery Higgins, Jeffery Higgins MRN: [id number] Date of Birth: 30-Jun-1956 Referring Physician:  Date of Service: 2023-01-05 Radiation Oncologist: Margaretmary Bayley, M.D. [facility]     RADIATION ONCOLOGY END OF TREATMENT NOTE     Diagnosis: 67 yo with multiple myeloma with symptomatic involvement C3 and right humeral head   Intent: Palliative     ==========DELIVERED PLANS==========  First Treatment Date: 2022-12-18 - Last Treatment Date: 2022-12-31   Plan Name: Spine_C Site: Cervical Spine Technique: 3D Mode: Photon Dose Per Fraction: 2 Gy Prescribed Dose (Delivered / Prescribed): 20 Gy / 20 Gy Prescribed Fxs (Delivered / Prescribed): 10 / 10   Plan Name: Ext_L_ProxHum Site: Humerus, Left Technique: 3D Mode: Photon Dose Per Fraction: 3 Gy Prescribed Dose (Delivered / Prescribed): 30 Gy / 30 Gy Prescribed Fxs (Delivered / Prescribed): 10 / 10     ==========ON TREATMENT VISIT DATES========== 2022-12-24, 2022-12-28   See weekly On Treatment Notes is Epic for details.  He tolerated the radiation treatments well without any acute ill side effects.  The patient will receive a call in about one month from the radiation oncology department. He will continue follow up with Dr. Truett Perna as well.  ------------------------------------------------   Margaretmary Dys, MD [facility]  Radiation Oncology Direct Dial: [phone]  Fax: [phone] [email or web address]  Skype  LinkedIn

## 2023-01-08 ENCOUNTER — Inpatient Hospital Stay: Payer: Medicare Other

## 2023-01-08 ENCOUNTER — Inpatient Hospital Stay: Payer: Medicare Other | Admitting: Nurse Practitioner

## 2023-01-08 ENCOUNTER — Encounter: Payer: Self-pay | Admitting: Nurse Practitioner

## 2023-01-08 VITALS — BP 132/71 | HR 78 | Temp 98.5°F | Resp 20

## 2023-01-08 DIAGNOSIS — C9000 Multiple myeloma not having achieved remission: Secondary | ICD-10-CM | POA: Diagnosis not present

## 2023-01-08 LAB — CMP (CANCER CENTER ONLY)
ALT: 11 U/L (ref 0–44)
AST: 11 U/L — ABNORMAL LOW (ref 15–41)
Albumin: 3.6 g/dL (ref 3.5–5.0)
Alkaline Phosphatase: 67 U/L (ref 38–126)
Anion gap: 4 — ABNORMAL LOW (ref 5–15)
BUN: 16 mg/dL (ref 8–23)
CO2: 28 mmol/L (ref 22–32)
Calcium: 8.7 mg/dL — ABNORMAL LOW (ref 8.9–10.3)
Chloride: 104 mmol/L (ref 98–111)
Creatinine: 0.91 mg/dL (ref 0.61–1.24)
GFR, Estimated: >60 mL/min (ref >60)
Glucose, Bld: 101 mg/dL — ABNORMAL HIGH (ref 70–99)
Potassium: 4.1 mmol/L (ref 3.5–5.1)
Sodium: 136 mmol/L (ref 135–145)
Total Bilirubin: 0.5 mg/dL (ref 0.3–1.2)
Total Protein: 8.7 g/dL — ABNORMAL HIGH (ref 6.5–8.1)

## 2023-01-08 LAB — CBC WITH DIFFERENTIAL (CANCER CENTER ONLY)
Abs Immature Granulocytes: 0.02 10*3/uL (ref 0.00–0.07)
Basophils Absolute: 0 10*3/uL (ref 0.0–0.1)
Basophils Relative: 0 %
Eosinophils Absolute: 0.5 10*3/uL (ref 0.0–0.5)
Eosinophils Relative: 8 %
HCT: 39 % (ref 39.0–52.0)
Hemoglobin: 12.8 g/dL — ABNORMAL LOW (ref 13.0–17.0)
Immature Granulocytes: 0 %
Lymphocytes Relative: 7 %
Lymphs Abs: 0.4 10*3/uL — ABNORMAL LOW (ref 0.7–4.0)
MCH: 29.3 pg (ref 26.0–34.0)
MCHC: 32.8 g/dL (ref 30.0–36.0)
MCV: 89.2 fL (ref 80.0–100.0)
Monocytes Absolute: 0.5 10*3/uL (ref 0.1–1.0)
Monocytes Relative: 8 %
Neutro Abs: 4.5 10*3/uL (ref 1.7–7.7)
Neutrophils Relative %: 77 %
Platelet Count: 190 10*3/uL (ref 150–400)
RBC: 4.37 MIL/uL (ref 4.22–5.81)
RDW: 15.2 % (ref 11.5–15.5)
WBC Count: 5.9 10*3/uL (ref 4.0–10.5)
nRBC: 0 % (ref 0.0–0.2)

## 2023-01-08 MED ORDER — DARATUMUMAB-HYALURONIDASE-FIHJ 1800-30000 MG-UT/15ML ~~LOC~~ SOLN
1800.0000 mg | Freq: Once | SUBCUTANEOUS | Status: AC
  Administered 2023-01-08: 1800 mg via SUBCUTANEOUS
  Filled 2023-01-08: qty 15

## 2023-01-08 MED ORDER — DEXAMETHASONE 4 MG PO TABS
20.0000 mg | ORAL_TABLET | Freq: Once | ORAL | Status: AC
  Administered 2023-01-08: 20 mg via ORAL
  Filled 2023-01-08: qty 5

## 2023-01-08 MED ORDER — BORTEZOMIB CHEMO SQ INJECTION 3.5 MG (2.5MG/ML)
1.3000 mg/m2 | Freq: Once | INTRAMUSCULAR | Status: AC
  Administered 2023-01-08: 3 mg via SUBCUTANEOUS
  Filled 2023-01-08: qty 1.2

## 2023-01-08 MED ORDER — DIPHENHYDRAMINE HCL 25 MG PO CAPS
50.0000 mg | ORAL_CAPSULE | Freq: Once | ORAL | Status: AC
  Administered 2023-01-08: 50 mg via ORAL
  Filled 2023-01-08: qty 2

## 2023-01-08 MED ORDER — MONTELUKAST SODIUM 10 MG PO TABS
10.0000 mg | ORAL_TABLET | Freq: Once | ORAL | Status: AC
  Administered 2023-01-08: 10 mg via ORAL
  Filled 2023-01-08: qty 1

## 2023-01-08 MED ORDER — ACETAMINOPHEN 325 MG PO TABS
650.0000 mg | ORAL_TABLET | Freq: Once | ORAL | Status: AC
  Administered 2023-01-08: 650 mg via ORAL
  Filled 2023-01-08: qty 2

## 2023-01-08 NOTE — Progress Notes (Signed)
Patient seen by Lisa Thomas NP today  Vitals are within treatment parameters.  Labs reviewed by Lisa Thomas NP and are within treatment parameters.  Per physician team, patient is ready for treatment and there are NO modifications to the treatment plan.     

## 2023-01-08 NOTE — Progress Notes (Signed)
[facility] OFFICE PROGRESS NOTE   Diagnosis: Multiple myeloma  INTERVAL HISTORY:   Mr. Okelley returns as scheduled.  He began cycle 1 day-VRD 01/01/2023.  He also received Zometa.  1 to 2 days after beginning treatment he had chills, headache and felt weak.  No nausea or vomiting.  No diarrhea.  No mouth sores.  Throat soreness has resolved.  He has a pruritic rash on the left hand which predated the start of treatment.  Some pain on his right side which is improving.  Periodic asthma symptoms, no change from baseline.  Objective:  Vital signs in last 24 hours:  Blood pressure (!) 144/76, pulse 83, temperature 98.1 F (36.7 C), temperature source Oral, resp. rate 18, height 5\' 10"  (1.778 m), weight 248 lb 3.2 oz (112.6 kg), SpO2 98 %.    HEENT: No thrush or ulcers. Resp: Lungs clear bilaterally. Cardio: Regular rate and rhythm. GI: No hepatosplenomegaly. Vascular: No leg edema. Skin: Slightly raised erythematous rash right hand around the base of the right thumb.   Lab Results:  Lab Results  Component Value Date   WBC 5.9 01/08/2023   HGB 12.8 (L) 01/08/2023   HCT 39.0 01/08/2023   MCV 89.2 01/08/2023   PLT 190 01/08/2023   NEUTROABS 4.5 01/08/2023    Imaging:  No results found.  Medications: I have reviewed the patient's current medications.  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 numerous enhancing lesions of the skull base and calvarium no evidence of extraosseous component 12/17/2022-MRI of the cervical, thoracic, and lumbar spine-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, clivus, bilateral occipital condyles, ribs, sacrum, and iliac bone, no evidence of pathologic fracture, expansile lesion in C3 extends into the spinal canal by 4 mm with moderate to severe spinal canal stenosis posterior to C3, mild right foraminal narrowing at T2-T3 and T3-4 related to osseous tumor at T3 12/17/2022-SPEP-2.6 g serum M spike, IgG kappa Bone marrow biopsy 12/18/2022-multiple myeloma with plasma cells involving 60% of the cellular marrow, kappa restricted myeloma FISH panel-QNS Bone survey 12/18/2022-lucencies at the skull, right scapula, left clavicle, lumbar spine, pelvis, and left femoral neck.  Destructive lesion at C3, possible sclerotic lucent lesions in the thoracic spine 12/18/2022 pelvic radiation to C3 and humeral head-12/31/2022 Cycle 1 daratumumab-VRD 01/01/2023   2.  Left shoulder pain-likely secondary to a lytic lesion at the left humeral head/neck 3.  Left chin/distal mandible numbness-likely secondary to myeloma compressing nerve roots at the brainstem or left face 4.  Hypertension 5.  Diabetes 6.  Sleep apnea 7.  Nephrolithiasis-right ureteral calculus urinary tract infection, placement of a right ureter stent right stone dislodgment 10/01/2022 8.  Asthma  Disposition: Mr. Arrizon appears stable.  He began cycle 1 daratumumab-VRD beginning 01/01/2023, also received Zometa.  1 to 2 days after treatment he experienced what sounds like flulike symptoms, likely related to Zometa.  Otherwise he seems be tolerating treatment well.  Plan to proceed with week 2 today as scheduled.  CBC and chemistry panel reviewed.  Labs adequate to proceed as above.  He will return for lab, follow-up, treatment in 1 week.  We are available to see him sooner if needed.   Misty Stanley  Kawan [place]adolid ANP/GNP-BC   01/08/2023  9:43 AM

## 2023-01-08 NOTE — Patient Instructions (Signed)
[facility]   Discharge Instructions: Thank you for choosing [facility] to provide your oncology and hematology care.   If you have a lab appointment with the Cancer Center, please go directly to the Cancer Center and check in at the registration area.   Wear comfortable clothing and clothing appropriate for easy access to any Portacath or PICC line.   We strive to give you quality time with your provider. You may need to reschedule your appointment if you arrive late (15 or more minutes).  Arriving late affects you and other patients whose appointments are after yours.  Also, if you miss three or more appointments without notifying the office, you may be dismissed from the clinic at the provider's discretion.      For prescription refill requests, have your pharmacy contact our office and allow 72 hours for refills to be completed.    Today you received the following chemotherapy and/or immunotherapy agents Bortezomib (VELCADE) & Daratumumab-hyaluronidase-fihj (DARZALEX FASPRO).      To help prevent nausea and vomiting after your treatment, we encourage you to take your nausea medication as directed.  BELOW ARE SYMPTOMS THAT SHOULD BE REPORTED IMMEDIATELY: *FEVER GREATER THAN 100.4 F (38 C) OR HIGHER *CHILLS OR SWEATING *NAUSEA AND VOMITING THAT IS NOT CONTROLLED WITH YOUR NAUSEA MEDICATION *UNUSUAL SHORTNESS OF BREATH *UNUSUAL BRUISING OR BLEEDING *URINARY PROBLEMS (pain or burning when urinating, or frequent urination) *BOWEL PROBLEMS (unusual diarrhea, constipation, pain near the anus) TENDERNESS IN MOUTH AND THROAT WITH OR WITHOUT PRESENCE OF ULCERS (sore throat, sores in mouth, or a toothache) UNUSUAL RASH, SWELLING OR PAIN  UNUSUAL VAGINAL DISCHARGE OR ITCHING   Items with * indicate a potential emergency and should be followed up as soon as possible or go to the Emergency Department if any problems should occur.  Please show  the CHEMOTHERAPY ALERT CARD or IMMUNOTHERAPY ALERT CARD at check-in to the Emergency Department and triage nurse.  Should you have questions after your visit or need to cancel or reschedule your appointment, please contact [facility]  Dept: [phone]  and follow the prompts.  Office hours are 8:00 a.m. to 4:30 p.m. Monday - Friday. Please note that voicemails left after 4:00 p.m. may not be returned until the following business day.  We are closed weekends and major holidays. You have access to a nurse at all times for urgent questions. Please call the main number to the clinic Dept: [phone] and follow the prompts.   For any non-urgent questions, you may also contact your provider using MyChart. We now offer e-Visits for anyone 16 and older to request care online for non-urgent symptoms. For details visit [email or web address].   Also download the MyChart app! Go to the app store, search "MyChart", open the app, select [facility], and log in with your MyChart username and password.  Bortezomib Injection What is this medication? BORTEZOMIB (bor TEZ oh mib) treats lymphoma. It may also be used to treat multiple myeloma, a type of bone marrow cancer. It works by blocking a protein that causes cancer cells to grow and multiply. This helps to slow or stop the spread of cancer cells. This medicine may be used for other purposes; ask your health care provider or pharmacist if you have questions. COMMON BRAND NAME(S): Velcade What should I tell my care team before I take this medication? They need to know if you have any of these conditions: Dehydration Diabetes Heart  disease Liver disease Tingling of the fingers or toes or other nerve disorder An unusual or allergic reaction to bortezomib, other medications, foods, dyes, or preservatives If you or your partner are pregnant or trying to get pregnant Breastfeeding How should I use this medication? This  medication is injected into a vein or under the skin. It is given by your care team in a hospital or clinic setting. Talk to your care team about the use of this medication in children. Special care may be needed. Overdosage: If you think you have taken too much of this medicine contact a poison control center or emergency room at once. NOTE: This medicine is only for you. Do not share this medicine with others. What if I miss a dose? Keep appointments for follow-up doses. It is important not to miss your dose. Call your care team if you are unable to keep an appointment. What may interact with this medication? Ketoconazole Rifampin This list may not describe all possible interactions. Give your health care provider a list of all the medicines, herbs, non-prescription drugs, or dietary supplements you use. Also tell them if you smoke, drink alcohol, or use illegal drugs. Some items may interact with your medicine. What should I watch for while using this medication? Your condition will be monitored carefully while you are receiving this medication. You may need blood work while taking this medication. This medication may affect your coordination, reaction time, or judgment. Do not drive or operate machinery until you know how this medication affects you. Sit up or stand slowly to reduce the risk of dizzy or fainting spells. Drinking alcohol with this medication can increase the risk of these side effects. This medication may increase your risk of getting an infection. Call your care team for advice if you get a fever, chills, sore throat, or other symptoms of a cold or flu. Do not treat yourself. Try to avoid being around people who are sick. Check with your care team if you have severe diarrhea, nausea, and vomiting, or if you sweat a lot. The loss of too much body fluid may make it dangerous for you to take this medication. Talk to your care team if you may be pregnant. Serious birth defects can  occur if you take this medication during pregnancy and for 7 months after the last dose. You will need a negative pregnancy test before starting this medication. Contraception is recommended while taking this medication and for 7 months after the last dose. Your care team can help you find the option that works for you. If your partner can get pregnant, use a condom during sex while taking this medication and for 4 months after the last dose. Do not breastfeed while taking this medication and for 2 months after the last dose. This medication may cause infertility. Talk to your care team if you are concerned about your fertility. What side effects may I notice from receiving this medication? Side effects that you should report to your care team as soon as possible: Allergic reactions--skin rash, itching, hives, swelling of the face, lips, tongue, or throat Bleeding--bloody or black, tar-like stools, vomiting blood or brown material that looks like coffee grounds, red or dark brown urine, small red or purple spots on skin, unusual bruising or bleeding Bleeding in the brain--severe headache, stiff neck, confusion, dizziness, change in vision, numbness or weakness of the face, arm, or leg, trouble speaking, trouble walking, vomiting Bowel blockage--stomach cramping, unable to have a bowel movement or  pass gas, loss of appetite, vomiting Heart failure--shortness of breath, swelling of the ankles, feet, or hands, sudden weight gain, unusual weakness or fatigue Infection--fever, chills, cough, sore throat, wounds that don't heal, pain or trouble when passing urine, general feeling of discomfort or being unwell Liver injury--right upper belly pain, loss of appetite, nausea, light-colored stool, dark yellow or brown urine, yellowing skin or eyes, unusual weakness or fatigue Low blood pressure--dizziness, feeling faint or lightheaded, blurry vision Lung injury--shortness of breath or trouble breathing, cough,  spitting up blood, chest pain, fever Pain, tingling, or numbness in the hands or feet Severe or prolonged diarrhea Stomach pain, bloody diarrhea, pale skin, unusual weakness or fatigue, decrease in the amount of urine, which may be signs of hemolytic uremic syndrome Sudden and severe headache, confusion, change in vision, seizures, which may be signs of posterior reversible encephalopathy syndrome (PRES) TTP--purple spots on the skin or inside the mouth, pale skin, yellowing skin or eyes, unusual weakness or fatigue, fever, fast or irregular heartbeat, confusion, change in vision, trouble speaking, trouble walking Tumor lysis syndrome (TLS)--nausea, vomiting, diarrhea, decrease in the amount of urine, dark urine, unusual weakness or fatigue, confusion, muscle pain or cramps, fast or irregular heartbeat, joint pain Side effects that usually do not require medical attention (report to your care team if they continue or are bothersome): Constipation Diarrhea Fatigue Loss of appetite Nausea This list may not describe all possible side effects. Call your doctor for medical advice about side effects. You may report side effects to FDA at 1-800-FDA-1088. Where should I keep my medication? This medication is given in a hospital or clinic. It will not be stored at home. NOTE: This sheet is a summary. It may not cover all possible information. If you have questions about this medicine, talk to your doctor, pharmacist, or health care provider.  2024 Elsevier/Gold Standard (2022-01-06 00:00:00)  Daratumumab; Hyaluronidase Injection What is this medication? DARATUMUMAB; HYALURONIDASE (dar a toom ue mab; hye al ur ON i dase) treats multiple myeloma, a type of bone marrow cancer. Daratumumab works by blocking a protein that causes cancer cells to grow and multiply. This helps to slow or stop the spread of cancer cells. Hyaluronidase works by increasing the absorption of other medications in the body to help  them work better. This medication may also be used treat amyloidosis, a condition that causes the buildup of a protein (amyloid) in your body. It works by reducing the buildup of this protein, which decreases symptoms. It is a combination medication that contains a monoclonal antibody. This medicine may be used for other purposes; ask your health care provider or pharmacist if you have questions. COMMON BRAND NAME(S): DARZALEX FASPRO What should I tell my care team before I take this medication? They need to know if you have any of these conditions: Heart disease Infection, such as chickenpox, cold sores, herpes, hepatitis B Lung or breathing disease An unusual or allergic reaction to daratumumab, hyaluronidase, other medications, foods, dyes, or preservatives Pregnant or trying to get pregnant Breast-feeding How should I use this medication? This medication is injected under the skin. It is given by your care team in a hospital or clinic setting. Talk to your care team about the use of this medication in children. Special care may be needed. Overdosage: If you think you have taken too much of this medicine contact a poison control center or emergency room at once. NOTE: This medicine is only for you. Do not share this medicine with  others. What if I miss a dose? Keep appointments for follow-up doses. It is important not to miss your dose. Call your care team if you are unable to keep an appointment. What may interact with this medication? Interactions have not been studied. This list may not describe all possible interactions. Give your health care provider a list of all the medicines, herbs, non-prescription drugs, or dietary supplements you use. Also tell them if you smoke, drink alcohol, or use illegal drugs. Some items may interact with your medicine. What should I watch for while using this medication? Your condition will be monitored carefully while you are receiving this  medication. This medication can cause serious allergic reactions. To reduce your risk, your care team may give you other medication to take before receiving this one. Be sure to follow the directions from your care team. This medication can affect the results of blood tests to match your blood type. These changes can last for up to 6 months after the final dose. Your care team will do blood tests to match your blood type before you start treatment. Tell all of your care team that you are being treated with this medication before receiving a blood transfusion. This medication can affect the results of some tests used to determine treatment response; extra tests may be needed to evaluate response. Talk to your care team if you wish to become pregnant or think you are pregnant. This medication can cause serious birth defects if taken during pregnancy and for 3 months after the last dose. A reliable form of contraception is recommended while taking this medication and for 3 months after the last dose. Talk to your care team about effective forms of contraception. Do not breast-feed while taking this medication. What side effects may I notice from receiving this medication? Side effects that you should report to your care team as soon as possible: Allergic reactions--skin rash, itching, hives, swelling of the face, lips, tongue, or throat Heart rhythm changes--fast or irregular heartbeat, dizziness, feeling faint or lightheaded, chest pain, trouble breathing Infection--fever, chills, cough, sore throat, wounds that don't heal, pain or trouble when passing urine, general feeling of discomfort or being unwell Infusion reactions--chest pain, shortness of breath or trouble breathing, feeling faint or lightheaded Sudden eye pain or change in vision such as blurry vision, seeing halos around lights, vision loss Unusual bruising or bleeding Side effects that usually do not require medical attention (report to your  care team if they continue or are bothersome): Constipation Diarrhea Fatigue Nausea Pain, tingling, or numbness in the hands or feet Swelling of the ankles, hands, or feet This list may not describe all possible side effects. Call your doctor for medical advice about side effects. You may report side effects to FDA at 1-800-FDA-1088. Where should I keep my medication? This medication is given in a hospital or clinic. It will not be stored at home. NOTE: This sheet is a summary. It may not cover all possible information. If you have questions about this medicine, talk to your doctor, pharmacist, or health care provider.  2024 Elsevier/Gold Standard (2021-12-09 00:00:00)

## 2023-01-11 ENCOUNTER — Other Ambulatory Visit: Payer: Self-pay | Admitting: Oncology

## 2023-01-14 ENCOUNTER — Other Ambulatory Visit: Payer: Self-pay | Admitting: Nurse Practitioner

## 2023-01-15 ENCOUNTER — Inpatient Hospital Stay: Payer: Medicare Other

## 2023-01-15 ENCOUNTER — Encounter: Payer: Self-pay | Admitting: Nurse Practitioner

## 2023-01-15 ENCOUNTER — Inpatient Hospital Stay: Payer: Medicare Other | Admitting: Nurse Practitioner

## 2023-01-15 DIAGNOSIS — C9000 Multiple myeloma not having achieved remission: Secondary | ICD-10-CM

## 2023-01-15 LAB — CBC WITH DIFFERENTIAL (CANCER CENTER ONLY)
Abs Immature Granulocytes: 0.02 10*3/uL (ref 0.00–0.07)
Basophils Absolute: 0 10*3/uL (ref 0.0–0.1)
Basophils Relative: 1 %
Eosinophils Absolute: 0.4 10*3/uL (ref 0.0–0.5)
Eosinophils Relative: 6 %
HCT: 37.2 % — ABNORMAL LOW (ref 39.0–52.0)
Hemoglobin: 12.2 g/dL — ABNORMAL LOW (ref 13.0–17.0)
Immature Granulocytes: 0 %
Lymphocytes Relative: 8 %
Lymphs Abs: 0.5 10*3/uL — ABNORMAL LOW (ref 0.7–4.0)
MCH: 29.4 pg (ref 26.0–34.0)
MCHC: 32.8 g/dL (ref 30.0–36.0)
MCV: 89.6 fL (ref 80.0–100.0)
Monocytes Absolute: 0.8 10*3/uL (ref 0.1–1.0)
Monocytes Relative: 12 %
Neutro Abs: 4.6 10*3/uL (ref 1.7–7.7)
Neutrophils Relative %: 73 %
Platelet Count: 174 10*3/uL (ref 150–400)
RBC: 4.15 MIL/uL — ABNORMAL LOW (ref 4.22–5.81)
RDW: 15.3 % (ref 11.5–15.5)
WBC Count: 6.3 10*3/uL (ref 4.0–10.5)
nRBC: 0 % (ref 0.0–0.2)

## 2023-01-15 MED ORDER — BORTEZOMIB CHEMO SQ INJECTION 3.5 MG (2.5MG/ML)
1.3000 mg/m2 | Freq: Once | INTRAMUSCULAR | Status: AC
  Administered 2023-01-15: 3 mg via SUBCUTANEOUS
  Filled 2023-01-15: qty 1.2

## 2023-01-15 MED ORDER — DARATUMUMAB-HYALURONIDASE-FIHJ 1800-30000 MG-UT/15ML ~~LOC~~ SOLN
1800.0000 mg | Freq: Once | SUBCUTANEOUS | Status: AC
  Administered 2023-01-15: 1800 mg via SUBCUTANEOUS
  Filled 2023-01-15: qty 15

## 2023-01-15 MED ORDER — ACETAMINOPHEN 325 MG PO TABS
650.0000 mg | ORAL_TABLET | Freq: Once | ORAL | Status: AC
  Administered 2023-01-15: 650 mg via ORAL
  Filled 2023-01-15: qty 2

## 2023-01-15 MED ORDER — DEXAMETHASONE 4 MG PO TABS
20.0000 mg | ORAL_TABLET | Freq: Once | ORAL | Status: AC
  Administered 2023-01-15: 20 mg via ORAL
  Filled 2023-01-15: qty 5

## 2023-01-15 MED ORDER — DIPHENHYDRAMINE HCL 25 MG PO CAPS
50.0000 mg | ORAL_CAPSULE | Freq: Once | ORAL | Status: AC
  Administered 2023-01-15: 50 mg via ORAL
  Filled 2023-01-15: qty 2

## 2023-01-15 MED ORDER — MONTELUKAST SODIUM 10 MG PO TABS
10.0000 mg | ORAL_TABLET | Freq: Once | ORAL | Status: AC
  Administered 2023-01-15: 10 mg via ORAL
  Filled 2023-01-15: qty 1

## 2023-01-15 NOTE — Patient Instructions (Addendum)
[facility]   Discharge Instructions: Thank you for choosing [facility] to provide your oncology and hematology care.   If you have a lab appointment with the Cancer Center, please go directly to the Cancer Center and check in at the registration area.   Wear comfortable clothing and clothing appropriate for easy access to any Portacath or PICC line.   We strive to give you quality time with your provider. You may need to reschedule your appointment if you arrive late (15 or more minutes).  Arriving late affects you and other patients whose appointments are after yours.  Also, if you miss three or more appointments without notifying the office, you may be dismissed from the clinic at the provider's discretion.      For prescription refill requests, have your pharmacy contact our office and allow 72 hours for refills to be completed.    Today you received the following chemotherapy and/or immunotherapy agents Bortezomib (VELCADE) & Daratumumab-hyaluronidase-fihj (DARZALEX FASPRO).      To help prevent nausea and vomiting after your treatment, we encourage you to take your nausea medication as directed.  BELOW ARE SYMPTOMS THAT SHOULD BE REPORTED IMMEDIATELY: *FEVER GREATER THAN 100.4 F (38 C) OR HIGHER *CHILLS OR SWEATING *NAUSEA AND VOMITING THAT IS NOT CONTROLLED WITH YOUR NAUSEA MEDICATION *UNUSUAL SHORTNESS OF BREATH *UNUSUAL BRUISING OR BLEEDING *URINARY PROBLEMS (pain or burning when urinating, or frequent urination) *BOWEL PROBLEMS (unusual diarrhea, constipation, pain near the anus) TENDERNESS IN MOUTH AND THROAT WITH OR WITHOUT PRESENCE OF ULCERS (sore throat, sores in mouth, or a toothache) UNUSUAL RASH, SWELLING OR PAIN  UNUSUAL VAGINAL DISCHARGE OR ITCHING   Items with * indicate a potential emergency and should be followed up as soon as possible or go to the Emergency Department if any problems should occur.  Please show  the CHEMOTHERAPY ALERT CARD or IMMUNOTHERAPY ALERT CARD at check-in to the Emergency Department and triage nurse.  Should you have questions after your visit or need to cancel or reschedule your appointment, please contact [facility]  Dept: [phone]  and follow the prompts.  Office hours are 8:00 a.m. to 4:30 p.m. Monday - Friday. Please note that voicemails left after 4:00 p.m. may not be returned until the following business day.  We are closed weekends and major holidays. You have access to a nurse at all times for urgent questions. Please call the main number to the clinic Dept: [phone] and follow the prompts.   For any non-urgent questions, you may also contact your provider using MyChart. We now offer e-Visits for anyone 18 and older to request care online for non-urgent symptoms. For details visit [email or web address].   Also download the MyChart app! Go to the app store, search "MyChart", open the app, select [facility], and log in with your MyChart username and password.  Bortezomib Injection What is this medication? BORTEZOMIB (bor TEZ oh mib) treats lymphoma. It may also be used to treat multiple myeloma, a type of bone marrow cancer. It works by blocking a protein that causes cancer cells to grow and multiply. This helps to slow or stop the spread of cancer cells. This medicine may be used for other purposes; ask your health care provider or pharmacist if you have questions. COMMON BRAND NAME(S): Velcade What should I tell my care team before I take this medication? They need to know if you have any of these conditions: Dehydration Diabetes Heart   disease Liver disease Tingling of the fingers or toes or other nerve disorder An unusual or allergic reaction to bortezomib, other medications, foods, dyes, or preservatives If you or your partner are pregnant or trying to get pregnant Breastfeeding How should I use this medication? This  medication is injected into a vein or under the skin. It is given by your care team in a hospital or clinic setting. Talk to your care team about the use of this medication in children. Special care may be needed. Overdosage: If you think you have taken too much of this medicine contact a poison control center or emergency room at once. NOTE: This medicine is only for you. Do not share this medicine with others. What if I miss a dose? Keep appointments for follow-up doses. It is important not to miss your dose. Call your care team if you are unable to keep an appointment. What may interact with this medication? Ketoconazole Rifampin This list may not describe all possible interactions. Give your health care provider a list of all the medicines, herbs, non-prescription drugs, or dietary supplements you use. Also tell them if you smoke, drink alcohol, or use illegal drugs. Some items may interact with your medicine. What should I watch for while using this medication? Your condition will be monitored carefully while you are receiving this medication. You may need blood work while taking this medication. This medication may affect your coordination, reaction time, or judgment. Do not drive or operate machinery until you know how this medication affects you. Sit up or stand slowly to reduce the risk of dizzy or fainting spells. Drinking alcohol with this medication can increase the risk of these side effects. This medication may increase your risk of getting an infection. Call your care team for advice if you get a fever, chills, sore throat, or other symptoms of a cold or flu. Do not treat yourself. Try to avoid being around people who are sick. Check with your care team if you have severe diarrhea, nausea, and vomiting, or if you sweat a lot. The loss of too much body fluid may make it dangerous for you to take this medication. Talk to your care team if you may be pregnant. Serious birth defects can  occur if you take this medication during pregnancy and for 7 months after the last dose. You will need a negative pregnancy test before starting this medication. Contraception is recommended while taking this medication and for 7 months after the last dose. Your care team can help you find the option that works for you. If your partner can get pregnant, use a condom during sex while taking this medication and for 4 months after the last dose. Do not breastfeed while taking this medication and for 2 months after the last dose. This medication may cause infertility. Talk to your care team if you are concerned about your fertility. What side effects may I notice from receiving this medication? Side effects that you should report to your care team as soon as possible: Allergic reactions--skin rash, itching, hives, swelling of the face, lips, tongue, or throat Bleeding--bloody or black, tar-like stools, vomiting blood or brown material that looks like coffee grounds, red or dark brown urine, small red or purple spots on skin, unusual bruising or bleeding Bleeding in the brain--severe headache, stiff neck, confusion, dizziness, change in vision, numbness or weakness of the face, arm, or leg, trouble speaking, trouble walking, vomiting Bowel blockage--stomach cramping, unable to have a bowel movement or   pass gas, loss of appetite, vomiting Heart failure--shortness of breath, swelling of the ankles, feet, or hands, sudden weight gain, unusual weakness or fatigue Infection--fever, chills, cough, sore throat, wounds that don't heal, pain or trouble when passing urine, general feeling of discomfort or being unwell Liver injury--right upper belly pain, loss of appetite, nausea, light-colored stool, dark yellow or brown urine, yellowing skin or eyes, unusual weakness or fatigue Low blood pressure--dizziness, feeling faint or lightheaded, blurry vision Lung injury--shortness of breath or trouble breathing, cough,  spitting up blood, chest pain, fever Pain, tingling, or numbness in the hands or feet Severe or prolonged diarrhea Stomach pain, bloody diarrhea, pale skin, unusual weakness or fatigue, decrease in the amount of urine, which may be signs of hemolytic uremic syndrome Sudden and severe headache, confusion, change in vision, seizures, which may be signs of posterior reversible encephalopathy syndrome (PRES) TTP--purple spots on the skin or inside the mouth, pale skin, yellowing skin or eyes, unusual weakness or fatigue, fever, fast or irregular heartbeat, confusion, change in vision, trouble speaking, trouble walking Tumor lysis syndrome (TLS)--nausea, vomiting, diarrhea, decrease in the amount of urine, dark urine, unusual weakness or fatigue, confusion, muscle pain or cramps, fast or irregular heartbeat, joint pain Side effects that usually do not require medical attention (report to your care team if they continue or are bothersome): Constipation Diarrhea Fatigue Loss of appetite Nausea This list may not describe all possible side effects. Call your doctor for medical advice about side effects. You may report side effects to FDA at 1-800-FDA-1088. Where should I keep my medication? This medication is given in a hospital or clinic. It will not be stored at home. NOTE: This sheet is a summary. It may not cover all possible information. If you have questions about this medicine, talk to your doctor, pharmacist, or health care provider.  2024 Elsevier/Gold Standard (2022-01-06 00:00:00)  Daratumumab; Hyaluronidase Injection What is this medication? DARATUMUMAB; HYALURONIDASE (dar a toom ue mab; hye al ur ON i dase) treats multiple myeloma, a type of bone marrow cancer. Daratumumab works by blocking a protein that causes cancer cells to grow and multiply. This helps to slow or stop the spread of cancer cells. Hyaluronidase works by increasing the absorption of other medications in the body to help  them work better. This medication may also be used treat amyloidosis, a condition that causes the buildup of a protein (amyloid) in your body. It works by reducing the buildup of this protein, which decreases symptoms. It is a combination medication that contains a monoclonal antibody. This medicine may be used for other purposes; ask your health care provider or pharmacist if you have questions. COMMON BRAND NAME(S): DARZALEX FASPRO What should I tell my care team before I take this medication? They need to know if you have any of these conditions: Heart disease Infection, such as chickenpox, cold sores, herpes, hepatitis B Lung or breathing disease An unusual or allergic reaction to daratumumab, hyaluronidase, other medications, foods, dyes, or preservatives Pregnant or trying to get pregnant Breast-feeding How should I use this medication? This medication is injected under the skin. It is given by your care team in a hospital or clinic setting. Talk to your care team about the use of this medication in children. Special care may be needed. Overdosage: If you think you have taken too much of this medicine contact a poison control center or emergency room at once. NOTE: This medicine is only for you. Do not share this medicine with   others. What if I miss a dose? Keep appointments for follow-up doses. It is important not to miss your dose. Call your care team if you are unable to keep an appointment. What may interact with this medication? Interactions have not been studied. This list may not describe all possible interactions. Give your health care provider a list of all the medicines, herbs, non-prescription drugs, or dietary supplements you use. Also tell them if you smoke, drink alcohol, or use illegal drugs. Some items may interact with your medicine. What should I watch for while using this medication? Your condition will be monitored carefully while you are receiving this  medication. This medication can cause serious allergic reactions. To reduce your risk, your care team may give you other medication to take before receiving this one. Be sure to follow the directions from your care team. This medication can affect the results of blood tests to match your blood type. These changes can last for up to 6 months after the final dose. Your care team will do blood tests to match your blood type before you start treatment. Tell all of your care team that you are being treated with this medication before receiving a blood transfusion. This medication can affect the results of some tests used to determine treatment response; extra tests may be needed to evaluate response. Talk to your care team if you wish to become pregnant or think you are pregnant. This medication can cause serious birth defects if taken during pregnancy and for 3 months after the last dose. A reliable form of contraception is recommended while taking this medication and for 3 months after the last dose. Talk to your care team about effective forms of contraception. Do not breast-feed while taking this medication. What side effects may I notice from receiving this medication? Side effects that you should report to your care team as soon as possible: Allergic reactions--skin rash, itching, hives, swelling of the face, lips, tongue, or throat Heart rhythm changes--fast or irregular heartbeat, dizziness, feeling faint or lightheaded, chest pain, trouble breathing Infection--fever, chills, cough, sore throat, wounds that don't heal, pain or trouble when passing urine, general feeling of discomfort or being unwell Infusion reactions--chest pain, shortness of breath or trouble breathing, feeling faint or lightheaded Sudden eye pain or change in vision such as blurry vision, seeing halos around lights, vision loss Unusual bruising or bleeding Side effects that usually do not require medical attention (report to your  care team if they continue or are bothersome): Constipation Diarrhea Fatigue Nausea Pain, tingling, or numbness in the hands or feet Swelling of the ankles, hands, or feet This list may not describe all possible side effects. Call your doctor for medical advice about side effects. You may report side effects to FDA at 1-800-FDA-1088. Where should I keep my medication? This medication is given in a hospital or clinic. It will not be stored at home. NOTE: This sheet is a summary. It may not cover all possible information. If you have questions about this medicine, talk to your doctor, pharmacist, or health care provider.  2024 Elsevier/Gold Standard (2021-12-09 00:00:00)  

## 2023-01-15 NOTE — Progress Notes (Signed)
Patient seen by Lisa Thomas NP today  Vitals are within treatment parameters.  Labs reviewed by Lisa Thomas NP and are within treatment parameters.  Per physician team, patient is ready for treatment and there are NO modifications to the treatment plan.     

## 2023-01-15 NOTE — Progress Notes (Signed)
  [facility] OFFICE PROGRESS NOTE   Diagnosis: Multiple myeloma  INTERVAL HISTORY:   Jeffery Higgins returns as scheduled.  He completed cycle 1 week 2 daratumumab-VRD 01/08/2023.  He did not experience the flulike symptoms.  Mild nausea on 1 day.  No diarrhea.  Many mornings he wakes up with a headache.  Caffeine seems to help the headache.  He has intermittent numbness in the fingertips.  He reports baseline tinnitus with some worsening prior to beginning treatment.  Objective:  Vital signs in last 24 hours:  Blood pressure (!) 141/65, pulse 72, temperature 98.2 F (36.8 C), temperature source Oral, resp. rate 16, weight 250 lb 11.2 oz (113.7 kg), SpO2 97 %.    HEENT: No thrush or ulcers. Resp: Lungs clear bilaterally. Cardio: Regular rate and rhythm. GI: Abdomen soft and nontender.  No hepatosplenomegaly. Vascular: No leg edema. Neuro: Alert and oriented. Skin: No rash.   Lab Results:  Lab Results  Component Value Date   WBC 6.3 01/15/2023   HGB 12.2 (L) 01/15/2023   HCT 37.2 (L) 01/15/2023   MCV 89.6 01/15/2023   PLT 174 01/15/2023   NEUTROABS 4.6 01/15/2023    Imaging:  No results found.  Medications: I have reviewed the patient's current medications.  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 numerous enhancing lesions of the skull base and calvarium no evidence of extraosseous component 12/17/2022-MRI of the cervical, thoracic, and lumbar spine-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, clivus, bilateral occipital condyles, ribs, sacrum,  and iliac bone, no evidence of pathologic fracture, expansile lesion in C3 extends into the spinal canal by 4 mm with moderate to severe spinal canal stenosis posterior to C3, mild right foraminal narrowing at T2-T3 and T3-4 related to osseous tumor at T3 12/17/2022-SPEP-2.6 g serum M spike, IgG kappa Bone marrow biopsy 12/18/2022-multiple myeloma with plasma cells involving 60% of the cellular marrow, kappa restricted myeloma FISH panel-QNS Bone survey 12/18/2022-lucencies at the skull, right scapula, left clavicle, lumbar spine, pelvis, and left femoral neck.  Destructive lesion at C3, possible sclerotic lucent lesions in the thoracic spine 12/18/2022 pelvic radiation to C3 and humeral head-12/31/2022 Cycle 1 daratumumab-VRD 01/01/2023 (Velcade 3 weeks on/1 week off)   2.  Left shoulder pain-likely secondary to a lytic lesion at the left humeral head/neck 3.  Left chin/distal mandible numbness-likely secondary to myeloma compressing nerve roots at the brainstem or left face 4.  Hypertension 5.  Diabetes 6.  Sleep apnea 7.  Nephrolithiasis-right ureteral calculus urinary tract infection, placement of a right ureter stent right stone dislodgment 10/01/2022 8.  Asthma  Disposition: Mr. Vigen appears stable.  He is completing cycle 1 daratumumab-VRD.  Plan to proceed with day 15 today as scheduled.  CBC reviewed.  Counts adequate to proceed with treatment as above.  He will return for daratumumab in 1 week.  We will see him in follow-up prior to beginning cycle 2 daratumumab-VRD in 2 weeks.  He will contact the office in the interim with any problems.  Plan reviewed with Dr. Truett Perna.    Jeffery Higgins ANP/GNP-BC   01/15/2023  10:45 AM

## 2023-01-17 ENCOUNTER — Other Ambulatory Visit: Payer: Self-pay | Admitting: Oncology

## 2023-01-17 DIAGNOSIS — C9000 Multiple myeloma not having achieved remission: Secondary | ICD-10-CM

## 2023-01-22 ENCOUNTER — Other Ambulatory Visit: Payer: Self-pay

## 2023-01-22 ENCOUNTER — Other Ambulatory Visit: Payer: Self-pay | Admitting: Oncology

## 2023-01-22 ENCOUNTER — Inpatient Hospital Stay: Payer: Medicare Other

## 2023-01-22 ENCOUNTER — Ambulatory Visit: Payer: Medicare Other

## 2023-01-22 ENCOUNTER — Other Ambulatory Visit: Payer: Medicare Other

## 2023-01-22 ENCOUNTER — Ambulatory Visit: Payer: Medicare Other | Admitting: Oncology

## 2023-01-22 ENCOUNTER — Inpatient Hospital Stay: Payer: Medicare Other | Attending: Nurse Practitioner

## 2023-01-22 VITALS — BP 143/74 | HR 70 | Temp 98.9°F | Resp 20 | Ht 70.0 in | Wt 252.4 lb

## 2023-01-22 DIAGNOSIS — C9000 Multiple myeloma not having achieved remission: Secondary | ICD-10-CM

## 2023-01-22 DIAGNOSIS — M25512 Pain in left shoulder: Secondary | ICD-10-CM | POA: Diagnosis not present

## 2023-01-22 DIAGNOSIS — J45909 Unspecified asthma, uncomplicated: Secondary | ICD-10-CM | POA: Insufficient documentation

## 2023-01-22 DIAGNOSIS — R200 Anesthesia of skin: Secondary | ICD-10-CM | POA: Insufficient documentation

## 2023-01-22 DIAGNOSIS — G4730 Sleep apnea, unspecified: Secondary | ICD-10-CM | POA: Insufficient documentation

## 2023-01-22 DIAGNOSIS — N202 Calculus of kidney with calculus of ureter: Secondary | ICD-10-CM | POA: Insufficient documentation

## 2023-01-22 DIAGNOSIS — I10 Essential (primary) hypertension: Secondary | ICD-10-CM | POA: Insufficient documentation

## 2023-01-22 DIAGNOSIS — Z5112 Encounter for antineoplastic immunotherapy: Secondary | ICD-10-CM | POA: Insufficient documentation

## 2023-01-22 DIAGNOSIS — E119 Type 2 diabetes mellitus without complications: Secondary | ICD-10-CM | POA: Insufficient documentation

## 2023-01-22 DIAGNOSIS — N390 Urinary tract infection, site not specified: Secondary | ICD-10-CM | POA: Insufficient documentation

## 2023-01-22 LAB — CBC WITH DIFFERENTIAL (CANCER CENTER ONLY)
Abs Immature Granulocytes: 0.01 10*3/uL (ref 0.00–0.07)
Basophils Absolute: 0 10*3/uL (ref 0.0–0.1)
Basophils Relative: 0 %
Eosinophils Absolute: 0.6 10*3/uL — ABNORMAL HIGH (ref 0.0–0.5)
Eosinophils Relative: 13 %
HCT: 37.6 % — ABNORMAL LOW (ref 39.0–52.0)
Hemoglobin: 12.4 g/dL — ABNORMAL LOW (ref 13.0–17.0)
Immature Granulocytes: 0 %
Lymphocytes Relative: 14 %
Lymphs Abs: 0.7 10*3/uL (ref 0.7–4.0)
MCH: 29.9 pg (ref 26.0–34.0)
MCHC: 33 g/dL (ref 30.0–36.0)
MCV: 90.6 fL (ref 80.0–100.0)
Monocytes Absolute: 0.6 10*3/uL (ref 0.1–1.0)
Monocytes Relative: 12 %
Neutro Abs: 2.9 10*3/uL (ref 1.7–7.7)
Neutrophils Relative %: 61 %
Platelet Count: 137 10*3/uL — ABNORMAL LOW (ref 150–400)
RBC: 4.15 MIL/uL — ABNORMAL LOW (ref 4.22–5.81)
RDW: 15.6 % — ABNORMAL HIGH (ref 11.5–15.5)
WBC Count: 4.8 10*3/uL (ref 4.0–10.5)
nRBC: 0 % (ref 0.0–0.2)

## 2023-01-22 MED ORDER — ACETAMINOPHEN 325 MG PO TABS
650.0000 mg | ORAL_TABLET | Freq: Once | ORAL | Status: AC
  Administered 2023-01-22: 650 mg via ORAL
  Filled 2023-01-22: qty 2

## 2023-01-22 MED ORDER — DIPHENHYDRAMINE HCL 25 MG PO CAPS
50.0000 mg | ORAL_CAPSULE | Freq: Once | ORAL | Status: AC
  Administered 2023-01-22: 50 mg via ORAL
  Filled 2023-01-22: qty 2

## 2023-01-22 MED ORDER — DARATUMUMAB-HYALURONIDASE-FIHJ 1800-30000 MG-UT/15ML ~~LOC~~ SOLN
1800.0000 mg | Freq: Once | SUBCUTANEOUS | Status: AC
  Administered 2023-01-22: 1800 mg via SUBCUTANEOUS
  Filled 2023-01-22: qty 15

## 2023-01-22 MED ORDER — DEXAMETHASONE 4 MG PO TABS
20.0000 mg | ORAL_TABLET | Freq: Once | ORAL | Status: AC
  Administered 2023-01-22: 20 mg via ORAL
  Filled 2023-01-22: qty 5

## 2023-01-22 MED ORDER — MONTELUKAST SODIUM 10 MG PO TABS
10.0000 mg | ORAL_TABLET | Freq: Once | ORAL | Status: AC
  Administered 2023-01-22: 10 mg via ORAL
  Filled 2023-01-22: qty 1

## 2023-01-22 NOTE — Patient Instructions (Addendum)
[facility]   Discharge Instructions: Thank you for choosing [facility] to provide your oncology and hematology care.   If you have a lab appointment with the Cancer Center, please go directly to the Cancer Center and check in at the registration area.   Wear comfortable clothing and clothing appropriate for easy access to any Portacath or PICC line.   We strive to give you quality time with your provider. You may need to reschedule your appointment if you arrive late (15 or more minutes).  Arriving late affects you and other patients whose appointments are after yours.  Also, if you miss three or more appointments without notifying the office, you may be dismissed from the clinic at the provider's discretion.      For prescription refill requests, have your pharmacy contact our office and allow 72 hours for refills to be completed.    Today you received the following chemotherapy and/or immunotherapy agents Daratumumab-hyaluronidase-fihj (DARZALEX FASPRO).      To help prevent nausea and vomiting after your treatment, we encourage you to take your nausea medication as directed.  BELOW ARE SYMPTOMS THAT SHOULD BE REPORTED IMMEDIATELY: *FEVER GREATER THAN 100.4 F (38 C) OR HIGHER *CHILLS OR SWEATING *NAUSEA AND VOMITING THAT IS NOT CONTROLLED WITH YOUR NAUSEA MEDICATION *UNUSUAL SHORTNESS OF BREATH *UNUSUAL BRUISING OR BLEEDING *URINARY PROBLEMS (pain or burning when urinating, or frequent urination) *BOWEL PROBLEMS (unusual diarrhea, constipation, pain near the anus) TENDERNESS IN MOUTH AND THROAT WITH OR WITHOUT PRESENCE OF ULCERS (sore throat, sores in mouth, or a toothache) UNUSUAL RASH, SWELLING OR PAIN  UNUSUAL VAGINAL DISCHARGE OR ITCHING   Items with * indicate a potential emergency and should be followed up as soon as possible or go to the Emergency Department if any problems should occur.  Please show the CHEMOTHERAPY ALERT  CARD or IMMUNOTHERAPY ALERT CARD at check-in to the Emergency Department and triage nurse.  Should you have questions after your visit or need to cancel or reschedule your appointment, please contact [facility]  Dept: [phone]  and follow the prompts.  Office hours are 8:00 a.m. to 4:30 p.m. Monday - Friday. Please note that voicemails left after 4:00 p.m. may not be returned until the following business day.  We are closed weekends and major holidays. You have access to a nurse at all times for urgent questions. Please call the main number to the clinic Dept: [phone] and follow the prompts.   For any non-urgent questions, you may also contact your provider using MyChart. We now offer e-Visits for anyone 18 and older to request care online for non-urgent symptoms. For details visit [email or web address].   Also download the MyChart app! Go to the app store, search "MyChart", open the app, select [place], and log in with your MyChart username and password.  Daratumumab; Hyaluronidase Injection What is this medication? DARATUMUMAB; HYALURONIDASE (dar a toom ue mab; hye al ur ON i dase) treats multiple myeloma, a type of bone marrow cancer. Daratumumab works by blocking a protein that causes cancer cells to grow and multiply. This helps to slow or stop the spread of cancer cells. Hyaluronidase works by increasing the absorption of other medications in the body to help them work better. This medication may also be used treat amyloidosis, a condition that causes the buildup of a protein (amyloid) in your body. It works by reducing the buildup of this protein, which decreases symptoms. It   is a combination medication that contains a monoclonal antibody. This medicine may be used for other purposes; ask your health care provider or pharmacist if you have questions. COMMON BRAND NAME(S): DARZALEX FASPRO What should I tell my care team before I take this  medication? They need to know if you have any of these conditions: Heart disease Infection, such as chickenpox, cold sores, herpes, hepatitis B Lung or breathing disease An unusual or allergic reaction to daratumumab, hyaluronidase, other medications, foods, dyes, or preservatives Pregnant or trying to get pregnant Breast-feeding How should I use this medication? This medication is injected under the skin. It is given by your care team in a hospital or clinic setting. Talk to your care team about the use of this medication in children. Special care may be needed. Overdosage: If you think you have taken too much of this medicine contact a poison control center or emergency room at once. NOTE: This medicine is only for you. Do not share this medicine with others. What if I miss a dose? Keep appointments for follow-up doses. It is important not to miss your dose. Call your care team if you are unable to keep an appointment. What may interact with this medication? Interactions have not been studied. This list may not describe all possible interactions. Give your health care provider a list of all the medicines, herbs, non-prescription drugs, or dietary supplements you use. Also tell them if you smoke, drink alcohol, or use illegal drugs. Some items may interact with your medicine. What should I watch for while using this medication? Your condition will be monitored carefully while you are receiving this medication. This medication can cause serious allergic reactions. To reduce your risk, your care team may give you other medication to take before receiving this one. Be sure to follow the directions from your care team. This medication can affect the results of blood tests to match your blood type. These changes can last for up to 6 months after the final dose. Your care team will do blood tests to match your blood type before you start treatment. Tell all of your care team that you are being  treated with this medication before receiving a blood transfusion. This medication can affect the results of some tests used to determine treatment response; extra tests may be needed to evaluate response. Talk to your care team if you wish to become pregnant or think you are pregnant. This medication can cause serious birth defects if taken during pregnancy and for 3 months after the last dose. A reliable form of contraception is recommended while taking this medication and for 3 months after the last dose. Talk to your care team about effective forms of contraception. Do not breast-feed while taking this medication. What side effects may I notice from receiving this medication? Side effects that you should report to your care team as soon as possible: Allergic reactions--skin rash, itching, hives, swelling of the face, lips, tongue, or throat Heart rhythm changes--fast or irregular heartbeat, dizziness, feeling faint or lightheaded, chest pain, trouble breathing Infection--fever, chills, cough, sore throat, wounds that don't heal, pain or trouble when passing urine, general feeling of discomfort or being unwell Infusion reactions--chest pain, shortness of breath or trouble breathing, feeling faint or lightheaded Sudden eye pain or change in vision such as blurry vision, seeing halos around lights, vision loss Unusual bruising or bleeding Side effects that usually do not require medical attention (report to your care team if they continue or are   bothersome): Constipation Diarrhea Fatigue Nausea Pain, tingling, or numbness in the hands or feet Swelling of the ankles, hands, or feet This list may not describe all possible side effects. Call your doctor for medical advice about side effects. You may report side effects to FDA at 1-800-FDA-1088. Where should I keep my medication? This medication is given in a hospital or clinic. It will not be stored at home. NOTE: This sheet is a summary. It may  not cover all possible information. If you have questions about this medicine, talk to your doctor, pharmacist, or health care provider.  2024 Elsevier/Gold Standard (2021-12-09 00:00:00)  

## 2023-01-24 ENCOUNTER — Other Ambulatory Visit: Payer: Self-pay | Admitting: Oncology

## 2023-01-24 DIAGNOSIS — C9000 Multiple myeloma not having achieved remission: Secondary | ICD-10-CM

## 2023-01-29 ENCOUNTER — Inpatient Hospital Stay (HOSPITAL_BASED_OUTPATIENT_CLINIC_OR_DEPARTMENT_OTHER): Payer: Medicare Other | Admitting: Nurse Practitioner

## 2023-01-29 ENCOUNTER — Encounter: Payer: Self-pay | Admitting: Nurse Practitioner

## 2023-01-29 ENCOUNTER — Inpatient Hospital Stay: Payer: Medicare Other

## 2023-01-29 ENCOUNTER — Other Ambulatory Visit: Payer: Self-pay

## 2023-01-29 VITALS — BP 152/66 | HR 73 | Temp 98.2°F | Resp 18 | Ht 70.0 in | Wt 247.5 lb

## 2023-01-29 DIAGNOSIS — C9000 Multiple myeloma not having achieved remission: Secondary | ICD-10-CM

## 2023-01-29 LAB — CBC WITH DIFFERENTIAL (CANCER CENTER ONLY)
Abs Immature Granulocytes: 0.02 10*3/uL (ref 0.00–0.07)
Basophils Absolute: 0.1 10*3/uL (ref 0.0–0.1)
Basophils Relative: 1 %
Eosinophils Absolute: 0.3 10*3/uL (ref 0.0–0.5)
Eosinophils Relative: 4 %
HCT: 41.1 % (ref 39.0–52.0)
Hemoglobin: 13.6 g/dL (ref 13.0–17.0)
Immature Granulocytes: 0 %
Lymphocytes Relative: 17 %
Lymphs Abs: 1.3 10*3/uL (ref 0.7–4.0)
MCH: 29.8 pg (ref 26.0–34.0)
MCHC: 33.1 g/dL (ref 30.0–36.0)
MCV: 89.9 fL (ref 80.0–100.0)
Monocytes Absolute: 0.9 10*3/uL (ref 0.1–1.0)
Monocytes Relative: 13 %
Neutro Abs: 4.7 10*3/uL (ref 1.7–7.7)
Neutrophils Relative %: 65 %
Platelet Count: 234 10*3/uL (ref 150–400)
RBC: 4.57 MIL/uL (ref 4.22–5.81)
RDW: 16 % — ABNORMAL HIGH (ref 11.5–15.5)
WBC Count: 7.2 10*3/uL (ref 4.0–10.5)
nRBC: 0 % (ref 0.0–0.2)

## 2023-01-29 LAB — CMP (CANCER CENTER ONLY)
ALT: 13 U/L (ref 0–44)
AST: 9 U/L — ABNORMAL LOW (ref 15–41)
Albumin: 3.9 g/dL (ref 3.5–5.0)
Alkaline Phosphatase: 133 U/L — ABNORMAL HIGH (ref 38–126)
Anion gap: 5 (ref 5–15)
BUN: 16 mg/dL (ref 8–23)
CO2: 28 mmol/L (ref 22–32)
Calcium: 8.9 mg/dL (ref 8.9–10.3)
Chloride: 104 mmol/L (ref 98–111)
Creatinine: 0.88 mg/dL (ref 0.61–1.24)
GFR, Estimated: >60 mL/min (ref >60)
Glucose, Bld: 141 mg/dL — ABNORMAL HIGH (ref 70–99)
Potassium: 4.2 mmol/L (ref 3.5–5.1)
Sodium: 137 mmol/L (ref 135–145)
Total Bilirubin: 0.5 mg/dL (ref 0.3–1.2)
Total Protein: 7.2 g/dL (ref 6.5–8.1)

## 2023-01-29 MED ORDER — LORAZEPAM 1 MG PO TABS: 0.5000 mg | ORAL_TABLET | Freq: Once | ORAL | Status: DC

## 2023-01-29 MED ORDER — DIPHENHYDRAMINE HCL 25 MG PO CAPS
50.0000 mg | ORAL_CAPSULE | Freq: Once | ORAL | Status: AC
  Administered 2023-01-29: 50 mg via ORAL
  Filled 2023-01-29: qty 2

## 2023-01-29 MED ORDER — BORTEZOMIB CHEMO SQ INJECTION 3.5 MG (2.5MG/ML)
1.3000 mg/m2 | Freq: Once | INTRAMUSCULAR | Status: AC
  Administered 2023-01-29: 3 mg via SUBCUTANEOUS
  Filled 2023-01-29: qty 1.2

## 2023-01-29 MED ORDER — ALPRAZOLAM 0.5 MG PO TABS
0.5000 mg | ORAL_TABLET | Freq: Two times a day (BID) | ORAL | 0 refills | Status: AC | PRN
Start: 2023-01-29 — End: ?

## 2023-01-29 MED ORDER — DEXAMETHASONE 4 MG PO TABS
20.0000 mg | ORAL_TABLET | Freq: Once | ORAL | Status: AC
  Administered 2023-01-29: 20 mg via ORAL
  Filled 2023-01-29: qty 5

## 2023-01-29 MED ORDER — ACETAMINOPHEN 325 MG PO TABS
650.0000 mg | ORAL_TABLET | Freq: Once | ORAL | Status: AC
  Administered 2023-01-29: 650 mg via ORAL
  Filled 2023-01-29: qty 2

## 2023-01-29 MED ORDER — DARATUMUMAB-HYALURONIDASE-FIHJ 1800-30000 MG-UT/15ML ~~LOC~~ SOLN
1800.0000 mg | Freq: Once | SUBCUTANEOUS | Status: AC
  Administered 2023-01-29: 1800 mg via SUBCUTANEOUS
  Filled 2023-01-29: qty 15

## 2023-01-29 MED ORDER — PANTOPRAZOLE SODIUM 20 MG PO TBEC
20.0000 mg | DELAYED_RELEASE_TABLET | Freq: Every day | ORAL | 1 refills | Status: AC
Start: 2023-01-29 — End: ?

## 2023-01-29 NOTE — Patient Instructions (Signed)
[facility]   Discharge Instructions: Thank you for choosing [facility] to provide your oncology and hematology care.   If you have a lab appointment with the Cancer Center, please go directly to the Cancer Center and check in at the registration area.   Wear comfortable clothing and clothing appropriate for easy access to any Portacath or PICC line.   We strive to give you quality time with your provider. You may need to reschedule your appointment if you arrive late (15 or more minutes).  Arriving late affects you and other patients whose appointments are after yours.  Also, if you miss three or more appointments without notifying the office, you may be dismissed from the clinic at the provider's discretion.      For prescription refill requests, have your pharmacy contact our office and allow 72 hours for refills to be completed.    Today you received the following chemotherapy and/or immunotherapy agents Velcade/Darzalex Faspro      To help prevent nausea and vomiting after your treatment, we encourage you to take your nausea medication as directed.  BELOW ARE SYMPTOMS THAT SHOULD BE REPORTED IMMEDIATELY: *FEVER GREATER THAN 100.4 F (38 C) OR HIGHER *CHILLS OR SWEATING *NAUSEA AND VOMITING THAT IS NOT CONTROLLED WITH YOUR NAUSEA MEDICATION *UNUSUAL SHORTNESS OF BREATH *UNUSUAL BRUISING OR BLEEDING *URINARY PROBLEMS (pain or burning when urinating, or frequent urination) *BOWEL PROBLEMS (unusual diarrhea, constipation, pain near the anus) TENDERNESS IN MOUTH AND THROAT WITH OR WITHOUT PRESENCE OF ULCERS (sore throat, sores in mouth, or a toothache) UNUSUAL RASH, SWELLING OR PAIN  UNUSUAL VAGINAL DISCHARGE OR ITCHING   Items with * indicate a potential emergency and should be followed up as soon as possible or go to the Emergency Department if any problems should occur.  Please show the CHEMOTHERAPY ALERT CARD or IMMUNOTHERAPY ALERT  CARD at check-in to the Emergency Department and triage nurse.  Should you have questions after your visit or need to cancel or reschedule your appointment, please contact [facility]  Dept: [phone]  and follow the prompts.  Office hours are 8:00 a.m. to 4:30 p.m. Monday - Friday. Please note that voicemails left after 4:00 p.m. may not be returned until the following business day.  We are closed weekends and major holidays. You have access to a nurse at all times for urgent questions. Please call the main number to the clinic Dept: [phone] and follow the prompts.   For any non-urgent questions, you may also contact your provider using MyChart. We now offer e-Visits for anyone 45 and older to request care online for non-urgent symptoms. For details visit [email or web address].   Also download the MyChart app! Go to the app store, search "MyChart", open the app, select [place], and log in with your MyChart username and password.  Bortezomib Injection What is this medication? BORTEZOMIB (bor TEZ oh mib) treats lymphoma. It may also be used to treat multiple myeloma, a type of bone marrow cancer. It works by blocking a protein that causes cancer cells to grow and multiply. This helps to slow or stop the spread of cancer cells. This medicine may be used for other purposes; ask your health care provider or pharmacist if you have questions. COMMON BRAND NAME(S): Velcade What should I tell my care team before I take this medication? They need to know if you have any of these conditions: Dehydration Diabetes Heart disease Liver disease Tingling  of the fingers or toes or other nerve disorder An unusual or allergic reaction to bortezomib, other medications, foods, dyes, or preservatives If you or your partner are pregnant or trying to get pregnant Breastfeeding How should I use this medication? This medication is injected into a vein or under the  skin. It is given by your care team in a hospital or clinic setting. Talk to your care team about the use of this medication in children. Special care may be needed. Overdosage: If you think you have taken too much of this medicine contact a poison control center or emergency room at once. NOTE: This medicine is only for you. Do not share this medicine with others. What if I miss a dose? Keep appointments for follow-up doses. It is important not to miss your dose. Call your care team if you are unable to keep an appointment. What may interact with this medication? Ketoconazole Rifampin This list may not describe all possible interactions. Give your health care provider a list of all the medicines, herbs, non-prescription drugs, or dietary supplements you use. Also tell them if you smoke, drink alcohol, or use illegal drugs. Some items may interact with your medicine. What should I watch for while using this medication? Your condition will be monitored carefully while you are receiving this medication. You may need blood work while taking this medication. This medication may affect your coordination, reaction time, or judgment. Do not drive or operate machinery until you know how this medication affects you. Sit up or stand slowly to reduce the risk of dizzy or fainting spells. Drinking alcohol with this medication can increase the risk of these side effects. This medication may increase your risk of getting an infection. Call your care team for advice if you get a fever, chills, sore throat, or other symptoms of a cold or flu. Do not treat yourself. Try to avoid being around people who are sick. Check with your care team if you have severe diarrhea, nausea, and vomiting, or if you sweat a lot. The loss of too much body fluid may make it dangerous for you to take this medication. Talk to your care team if you may be pregnant. Serious birth defects can occur if you take this medication during pregnancy  and for 7 months after the last dose. You will need a negative pregnancy test before starting this medication. Contraception is recommended while taking this medication and for 7 months after the last dose. Your care team can help you find the option that works for you. If your partner can get pregnant, use a condom during sex while taking this medication and for 4 months after the last dose. Do not breastfeed while taking this medication and for 2 months after the last dose. This medication may cause infertility. Talk to your care team if you are concerned about your fertility. What side effects may I notice from receiving this medication? Side effects that you should report to your care team as soon as possible: Allergic reactions--skin rash, itching, hives, swelling of the face, lips, tongue, or throat Bleeding--bloody or black, tar-like stools, vomiting blood or brown material that looks like coffee grounds, red or dark brown urine, small red or purple spots on skin, unusual bruising or bleeding Bleeding in the brain--severe headache, stiff neck, confusion, dizziness, change in vision, numbness or weakness of the face, arm, or leg, trouble speaking, trouble walking, vomiting Bowel blockage--stomach cramping, unable to have a bowel movement or pass gas, loss of  appetite, vomiting Heart failure--shortness of breath, swelling of the ankles, feet, or hands, sudden weight gain, unusual weakness or fatigue Infection--fever, chills, cough, sore throat, wounds that don't heal, pain or trouble when passing urine, general feeling of discomfort or being unwell Liver injury--right upper belly pain, loss of appetite, nausea, light-colored stool, dark yellow or brown urine, yellowing skin or eyes, unusual weakness or fatigue Low blood pressure--dizziness, feeling faint or lightheaded, blurry vision Lung injury--shortness of breath or trouble breathing, cough, spitting up blood, chest pain, fever Pain, tingling,  or numbness in the hands or feet Severe or prolonged diarrhea Stomach pain, bloody diarrhea, pale skin, unusual weakness or fatigue, decrease in the amount of urine, which may be signs of hemolytic uremic syndrome Sudden and severe headache, confusion, change in vision, seizures, which may be signs of posterior reversible encephalopathy syndrome (PRES) TTP--purple spots on the skin or inside the mouth, pale skin, yellowing skin or eyes, unusual weakness or fatigue, fever, fast or irregular heartbeat, confusion, change in vision, trouble speaking, trouble walking Tumor lysis syndrome (TLS)--nausea, vomiting, diarrhea, decrease in the amount of urine, dark urine, unusual weakness or fatigue, confusion, muscle pain or cramps, fast or irregular heartbeat, joint pain Side effects that usually do not require medical attention (report to your care team if they continue or are bothersome): Constipation Diarrhea Fatigue Loss of appetite Nausea This list may not describe all possible side effects. Call your doctor for medical advice about side effects. You may report side effects to FDA at 1-800-FDA-1088. Where should I keep my medication? This medication is given in a hospital or clinic. It will not be stored at home. NOTE: This sheet is a summary. It may not cover all possible information. If you have questions about this medicine, talk to your doctor, pharmacist, or health care provider.  2024 Elsevier/Gold Standard (2022-01-06 00:00:00)  Daratumumab; Hyaluronidase Injection What is this medication? DARATUMUMAB; HYALURONIDASE (dar a toom ue mab; hye al ur ON i dase) treats multiple myeloma, a type of bone marrow cancer. Daratumumab works by blocking a protein that causes cancer cells to grow and multiply. This helps to slow or stop the spread of cancer cells. Hyaluronidase works by increasing the absorption of other medications in the body to help them work better. This medication may also be used  treat amyloidosis, a condition that causes the buildup of a protein (amyloid) in your body. It works by reducing the buildup of this protein, which decreases symptoms. It is a combination medication that contains a monoclonal antibody. This medicine may be used for other purposes; ask your health care provider or pharmacist if you have questions. COMMON BRAND NAME(S): DARZALEX FASPRO What should I tell my care team before I take this medication? They need to know if you have any of these conditions: Heart disease Infection, such as chickenpox, cold sores, herpes, hepatitis B Lung or breathing disease An unusual or allergic reaction to daratumumab, hyaluronidase, other medications, foods, dyes, or preservatives Pregnant or trying to get pregnant Breast-feeding How should I use this medication? This medication is injected under the skin. It is given by your care team in a hospital or clinic setting. Talk to your care team about the use of this medication in children. Special care may be needed. Overdosage: If you think you have taken too much of this medicine contact a poison control center or emergency room at once. NOTE: This medicine is only for you. Do not share this medicine with others. What if I  miss a dose? Keep appointments for follow-up doses. It is important not to miss your dose. Call your care team if you are unable to keep an appointment. What may interact with this medication? Interactions have not been studied. This list may not describe all possible interactions. Give your health care provider a list of all the medicines, herbs, non-prescription drugs, or dietary supplements you use. Also tell them if you smoke, drink alcohol, or use illegal drugs. Some items may interact with your medicine. What should I watch for while using this medication? Your condition will be monitored carefully while you are receiving this medication. This medication can cause serious allergic reactions.  To reduce your risk, your care team may give you other medication to take before receiving this one. Be sure to follow the directions from your care team. This medication can affect the results of blood tests to match your blood type. These changes can last for up to 6 months after the final dose. Your care team will do blood tests to match your blood type before you start treatment. Tell all of your care team that you are being treated with this medication before receiving a blood transfusion. This medication can affect the results of some tests used to determine treatment response; extra tests may be needed to evaluate response. Talk to your care team if you wish to become pregnant or think you are pregnant. This medication can cause serious birth defects if taken during pregnancy and for 3 months after the last dose. A reliable form of contraception is recommended while taking this medication and for 3 months after the last dose. Talk to your care team about effective forms of contraception. Do not breast-feed while taking this medication. What side effects may I notice from receiving this medication? Side effects that you should report to your care team as soon as possible: Allergic reactions--skin rash, itching, hives, swelling of the face, lips, tongue, or throat Heart rhythm changes--fast or irregular heartbeat, dizziness, feeling faint or lightheaded, chest pain, trouble breathing Infection--fever, chills, cough, sore throat, wounds that don't heal, pain or trouble when passing urine, general feeling of discomfort or being unwell Infusion reactions--chest pain, shortness of breath or trouble breathing, feeling faint or lightheaded Sudden eye pain or change in vision such as blurry vision, seeing halos around lights, vision loss Unusual bruising or bleeding Side effects that usually do not require medical attention (report to your care team if they continue or are  bothersome): Constipation Diarrhea Fatigue Nausea Pain, tingling, or numbness in the hands or feet Swelling of the ankles, hands, or feet This list may not describe all possible side effects. Call your doctor for medical advice about side effects. You may report side effects to FDA at 1-800-FDA-1088. Where should I keep my medication? This medication is given in a hospital or clinic. It will not be stored at home. NOTE: This sheet is a summary. It may not cover all possible information. If you have questions about this medicine, talk to your doctor, pharmacist, or health care provider.  2024 Elsevier/Gold Standard (2021-12-09 00:00:00)

## 2023-01-29 NOTE — Progress Notes (Signed)
Patient seen by Lisa Thomas NP today  Vitals are within treatment parameters.  Labs reviewed by Lisa Thomas NP and are within treatment parameters.  Per physician team, patient is ready for treatment and there are NO modifications to the treatment plan.     

## 2023-01-29 NOTE — Progress Notes (Signed)
[facility] OFFICE PROGRESS NOTE   Diagnosis: Multiple myeloma  INTERVAL HISTORY:   Jeffery Higgins as scheduled.  He has completed 1 cycle of daratumumab-VRD.  He denies nausea/vomiting.  No mouth sores.  No diarrhea.  No rash.  He has had neck pain over the past few days, also shoulder pain.  He is having anxiety and wonders if there is something he could take to help with this.  Also difficulty sleeping.  Objective:  Vital signs in last 24 hours:  Blood pressure (!) 152/66, pulse 73, temperature 98.2 F (36.8 C), temperature source Oral, resp. rate 18, height 5\' 10"  (1.778 m), weight 247 lb 8 oz (112.3 kg), SpO2 98 %.    HEENT: No thrush or ulcers. Resp: Lungs clear bilaterally. Cardio: Regular rate and rhythm. GI: No hepatosplenomegaly. Vascular: No leg edema.  Skin: No rash.   Lab Results:  Lab Results  Component Value Date   WBC 7.2 01/29/2023   HGB 13.6 01/29/2023   HCT 41.1 01/29/2023   MCV 89.9 01/29/2023   PLT 234 01/29/2023   NEUTROABS 4.7 01/29/2023    Imaging:  No results found.  Medications: I have reviewed the patient's current medications.  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 numerous enhancing lesions of the skull base and calvarium no evidence of extraosseous component 12/17/2022-MRI of the cervical, thoracic, and lumbar spine-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, clivus, bilateral occipital condyles, ribs, sacrum, and iliac bone, no evidence of pathologic fracture, expansile lesion in C3 extends into the spinal  canal by 4 mm with moderate to severe spinal canal stenosis posterior to C3, mild right foraminal narrowing at T2-T3 and T3-4 related to osseous tumor at T3 12/17/2022-SPEP-2.6 g serum M spike, IgG kappa Bone marrow biopsy 12/18/2022-multiple myeloma with plasma cells involving 60% of the cellular marrow, kappa restricted myeloma FISH panel-QNS Bone survey 12/18/2022-lucencies at the skull, right scapula, left clavicle, lumbar spine, pelvis, and left femoral neck.  Destructive lesion at C3, possible sclerotic lucent lesions in the thoracic spine 12/18/2022 pelvic radiation to C3 and humeral head-12/31/2022 Cycle 1 daratumumab-VRD 01/01/2023 (Velcade 3 weeks on/1 week off) Cycle 2 daratumumab-VRD 01/29/2023   2.  Left shoulder pain-likely secondary to a lytic lesion at the left humeral head/neck 3.  Left chin/distal mandible numbness-likely secondary to myeloma compressing nerve roots at the brainstem or left face 4.  Hypertension 5.  Diabetes 6.  Sleep apnea 7.  Nephrolithiasis-right ureteral calculus urinary tract infection, placement of a right ureter stent right stone dislodgment 10/01/2022 8.  Asthma  Disposition: Jeffery Higgins appears stable.  He has completed 1 cycle of daratumumab-VRD.  He will begin cycle 2 today as scheduled.  Plan for Zometa 4 mg IV monthly during induction therapy.  He does not want to receive Zometa today.  He would like to be scheduled for Zometa when he is here next week.  For anxiety he will try Xanax 0.5 mg twice daily as needed.  CBC and chemistry panel from today reviewed.  Labs adequate to proceed with cycle 2 daratumumab-VRD.  We will see him in follow-up in 2 weeks.  He will contact the office in the interim with any problems.  Patient  seen with Dr. Truett Perna.  Lonna Cobb ANP/GNP-BC   01/29/2023  8:53 AM  This was a shared visit with Lonna Cobb.  Jeffery Higgins has completed 1 cycle of d-VRD.  He has tolerated the treatment well.  He will complete cycle 2 beginning  today.  He has decided against stem cell therapy.  The plan is to complete 4-6 cycles of the current treatment prior to maintenance therapy.  I was present for greater than 50% of today's visit.  I performed medical decision making.  Mancel Bale, MD

## 2023-01-30 ENCOUNTER — Other Ambulatory Visit: Payer: Self-pay

## 2023-01-31 ENCOUNTER — Other Ambulatory Visit: Payer: Self-pay | Admitting: Oncology

## 2023-02-01 ENCOUNTER — Telehealth: Payer: Self-pay | Admitting: Oncology

## 2023-02-01 NOTE — Telephone Encounter (Signed)
Called patient about upcoming appointment received no answer unable to leave vm mailbox full.

## 2023-02-05 ENCOUNTER — Inpatient Hospital Stay: Payer: Medicare Other

## 2023-02-05 VITALS — BP 145/74 | HR 74 | Resp 18 | Ht 70.0 in | Wt 246.9 lb

## 2023-02-05 DIAGNOSIS — C9000 Multiple myeloma not having achieved remission: Secondary | ICD-10-CM

## 2023-02-05 LAB — CBC WITH DIFFERENTIAL (CANCER CENTER ONLY)
Abs Immature Granulocytes: 0.02 10*3/uL (ref 0.00–0.07)
Basophils Absolute: 0.1 10*3/uL (ref 0.0–0.1)
Basophils Relative: 1 %
Eosinophils Absolute: 0.5 10*3/uL (ref 0.0–0.5)
Eosinophils Relative: 8 %
HCT: 40.7 % (ref 39.0–52.0)
Hemoglobin: 13.3 g/dL (ref 13.0–17.0)
Immature Granulocytes: 0 %
Lymphocytes Relative: 14 %
Lymphs Abs: 0.9 10*3/uL (ref 0.7–4.0)
MCH: 29.6 pg (ref 26.0–34.0)
MCHC: 32.7 g/dL (ref 30.0–36.0)
MCV: 90.4 fL (ref 80.0–100.0)
Monocytes Absolute: 0.5 10*3/uL (ref 0.1–1.0)
Monocytes Relative: 8 %
Neutro Abs: 4 10*3/uL (ref 1.7–7.7)
Neutrophils Relative %: 69 %
Platelet Count: 151 10*3/uL (ref 150–400)
RBC: 4.5 MIL/uL (ref 4.22–5.81)
RDW: 16.8 % — ABNORMAL HIGH (ref 11.5–15.5)
WBC Count: 5.9 10*3/uL (ref 4.0–10.5)
nRBC: 0 % (ref 0.0–0.2)

## 2023-02-05 LAB — CMP (CANCER CENTER ONLY)
ALT: 13 U/L (ref 0–44)
AST: 11 U/L — ABNORMAL LOW (ref 15–41)
Albumin: 4 g/dL (ref 3.5–5.0)
Alkaline Phosphatase: 102 U/L (ref 38–126)
Anion gap: 5 (ref 5–15)
BUN: 15 mg/dL (ref 8–23)
CO2: 27 mmol/L (ref 22–32)
Calcium: 8.4 mg/dL — ABNORMAL LOW (ref 8.9–10.3)
Chloride: 105 mmol/L (ref 98–111)
Creatinine: 0.91 mg/dL (ref 0.61–1.24)
GFR, Estimated: >60 mL/min (ref >60)
Glucose, Bld: 156 mg/dL — ABNORMAL HIGH (ref 70–99)
Potassium: 4.2 mmol/L (ref 3.5–5.1)
Sodium: 137 mmol/L (ref 135–145)
Total Bilirubin: 0.5 mg/dL (ref 0.3–1.2)
Total Protein: 7 g/dL (ref 6.5–8.1)

## 2023-02-05 MED ORDER — SODIUM CHLORIDE 0.9 % IV SOLN: Freq: Once | INTRAVENOUS | Status: AC

## 2023-02-05 MED ORDER — DARATUMUMAB-HYALURONIDASE-FIHJ 1800-30000 MG-UT/15ML ~~LOC~~ SOLN
1800.0000 mg | Freq: Once | SUBCUTANEOUS | Status: AC
  Administered 2023-02-05: 1800 mg via SUBCUTANEOUS
  Filled 2023-02-05: qty 15

## 2023-02-05 MED ORDER — DIPHENHYDRAMINE HCL 25 MG PO CAPS
25.0000 mg | ORAL_CAPSULE | Freq: Once | ORAL | Status: AC
  Administered 2023-02-05: 25 mg via ORAL
  Filled 2023-02-05: qty 1

## 2023-02-05 MED ORDER — BORTEZOMIB CHEMO SQ INJECTION 3.5 MG (2.5MG/ML)
1.3000 mg/m2 | Freq: Once | INTRAMUSCULAR | Status: AC
  Administered 2023-02-05: 3 mg via SUBCUTANEOUS
  Filled 2023-02-05: qty 1.2

## 2023-02-05 MED ORDER — ACETAMINOPHEN 325 MG PO TABS
650.0000 mg | ORAL_TABLET | Freq: Once | ORAL | Status: AC
  Administered 2023-02-05: 650 mg via ORAL
  Filled 2023-02-05: qty 2

## 2023-02-05 MED ORDER — ZOLEDRONIC ACID 4 MG/100ML IV SOLN
4.0000 mg | Freq: Once | INTRAVENOUS | Status: AC
  Administered 2023-02-05: 4 mg via INTRAVENOUS
  Filled 2023-02-05: qty 100

## 2023-02-05 MED ORDER — DEXAMETHASONE 4 MG PO TABS
20.0000 mg | ORAL_TABLET | Freq: Once | ORAL | Status: AC
  Administered 2023-02-05: 20 mg via ORAL
  Filled 2023-02-05: qty 5

## 2023-02-05 NOTE — Progress Notes (Signed)
VO per Md to decrease diphenhydramine to 25 mg and Ok to give Zometa with Ca of 8.4

## 2023-02-05 NOTE — Patient Instructions (Signed)
[place] [facility]   Discharge Instructions: Thank you for choosing [facility] to provide your oncology and hematology care.   If you have a lab appointment with the Cancer Center, please go directly to the Cancer Center and check in at the registration area.   Wear comfortable clothing and clothing appropriate for easy access to any Portacath or PICC line.   We strive to give you quality time with your provider. You may need to reschedule your appointment if you arrive late (15 or more minutes).  Arriving late affects you and other patients whose appointments are after yours.  Also, if you miss three or more appointments without notifying the office, you may be dismissed from the clinic at the provider's discretion.      For prescription refill requests, have your pharmacy contact our office and allow 72 hours for refills to be completed.    Today you received the following chemotherapy and/or immunotherapy agents Velcade, Darzalex-Faspro.      To help prevent nausea and vomiting after your treatment, we encourage you to take your nausea medication as directed.  BELOW ARE SYMPTOMS THAT SHOULD BE REPORTED IMMEDIATELY: *FEVER GREATER THAN 100.4 F (38 C) OR HIGHER *CHILLS OR SWEATING *NAUSEA AND VOMITING THAT IS NOT CONTROLLED WITH YOUR NAUSEA MEDICATION *UNUSUAL SHORTNESS OF BREATH *UNUSUAL BRUISING OR BLEEDING *URINARY PROBLEMS (pain or burning when urinating, or frequent urination) *BOWEL PROBLEMS (unusual diarrhea, constipation, pain near the anus) TENDERNESS IN MOUTH AND THROAT WITH OR WITHOUT PRESENCE OF ULCERS (sore throat, sores in mouth, or a toothache) UNUSUAL RASH, SWELLING OR PAIN  UNUSUAL VAGINAL DISCHARGE OR ITCHING   Items with * indicate a potential emergency and should be followed up as soon as possible or go to the Emergency Department if any problems should occur.  Please show the CHEMOTHERAPY ALERT CARD or IMMUNOTHERAPY  ALERT CARD at check-in to the Emergency Department and triage nurse.  Should you have questions after your visit or need to cancel or reschedule your appointment, please contact [place] [facility]  Dept: [phone]  and follow the prompts.  Office hours are 8:00 a.m. to 4:30 p.m. Monday - Friday. Please note that voicemails left after 4:00 p.m. may not be returned until the following business day.  We are closed weekends and major holidays. You have access to a nurse at all times for urgent questions. Please call the main number to the clinic Dept: [phone] and follow the prompts.   For any non-urgent questions, you may also contact your provider using MyChart. We now offer e-Visits for anyone 59 and older to request care online for non-urgent symptoms. For details visit [email or web address].   Also download the MyChart app! Go to the app store, search "MyChart", open the app, select , and log in with your MyChart username and password.  Bortezomib Injection What is this medication? BORTEZOMIB (bor TEZ oh mib) treats lymphoma. It may also be used to treat multiple myeloma, a type of bone marrow cancer. It works by blocking a protein that causes cancer cells to grow and multiply. This helps to slow or stop the spread of cancer cells. This medicine may be used for other purposes; ask your health care provider or pharmacist if you have questions. COMMON BRAND NAME(S): Velcade What should I tell my care team before I take this medication? They need to know if you have any of these conditions: Dehydration Diabetes Heart disease Liver disease Tingling  of the fingers or toes or other nerve disorder An unusual or allergic reaction to bortezomib, other medications, foods, dyes, or preservatives If you or your partner are pregnant or trying to get pregnant Breastfeeding How should I use this medication? This medication is injected into a vein or under  the skin. It is given by your care team in a hospital or clinic setting. Talk to your care team about the use of this medication in children. Special care may be needed. Overdosage: If you think you have taken too much of this medicine contact a poison control center or emergency room at once. NOTE: This medicine is only for you. Do not share this medicine with others. What if I miss a dose? Keep appointments for follow-up doses. It is important not to miss your dose. Call your care team if you are unable to keep an appointment. What may interact with this medication? Ketoconazole Rifampin This list may not describe all possible interactions. Give your health care provider a list of all the medicines, herbs, non-prescription drugs, or dietary supplements you use. Also tell them if you smoke, drink alcohol, or use illegal drugs. Some items may interact with your medicine. What should I watch for while using this medication? Your condition will be monitored carefully while you are receiving this medication. You may need blood work while taking this medication. This medication may affect your coordination, reaction time, or judgment. Do not drive or operate machinery until you know how this medication affects you. Sit up or stand slowly to reduce the risk of dizzy or fainting spells. Drinking alcohol with this medication can increase the risk of these side effects. This medication may increase your risk of getting an infection. Call your care team for advice if you get a fever, chills, sore throat, or other symptoms of a cold or flu. Do not treat yourself. Try to avoid being around people who are sick. Check with your care team if you have severe diarrhea, nausea, and vomiting, or if you sweat a lot. The loss of too much body fluid may make it dangerous for you to take this medication. Talk to your care team if you may be pregnant. Serious birth defects can occur if you take this medication during  pregnancy and for 7 months after the last dose. You will need a negative pregnancy test before starting this medication. Contraception is recommended while taking this medication and for 7 months after the last dose. Your care team can help you find the option that works for you. If your partner can get pregnant, use a condom during sex while taking this medication and for 4 months after the last dose. Do not breastfeed while taking this medication and for 2 months after the last dose. This medication may cause infertility. Talk to your care team if you are concerned about your fertility. What side effects may I notice from receiving this medication? Side effects that you should report to your care team as soon as possible: Allergic reactions--skin rash, itching, hives, swelling of the face, lips, tongue, or throat Bleeding--bloody or black, tar-like stools, vomiting blood or brown material that looks like coffee grounds, red or dark brown urine, small red or purple spots on skin, unusual bruising or bleeding Bleeding in the brain--severe headache, stiff neck, confusion, dizziness, change in vision, numbness or weakness of the face, arm, or leg, trouble speaking, trouble walking, vomiting Bowel blockage--stomach cramping, unable to have a bowel movement or pass gas, loss of  appetite, vomiting Heart failure--shortness of breath, swelling of the ankles, feet, or hands, sudden weight gain, unusual weakness or fatigue Infection--fever, chills, cough, sore throat, wounds that don't heal, pain or trouble when passing urine, general feeling of discomfort or being unwell Liver injury--right upper belly pain, loss of appetite, nausea, light-colored stool, dark yellow or brown urine, yellowing skin or eyes, unusual weakness or fatigue Low blood pressure--dizziness, feeling faint or lightheaded, blurry vision Lung injury--shortness of breath or trouble breathing, cough, spitting up blood, chest pain, fever Pain,  tingling, or numbness in the hands or feet Severe or prolonged diarrhea Stomach pain, bloody diarrhea, pale skin, unusual weakness or fatigue, decrease in the amount of urine, which may be signs of hemolytic uremic syndrome Sudden and severe headache, confusion, change in vision, seizures, which may be signs of posterior reversible encephalopathy syndrome (PRES) TTP--purple spots on the skin or inside the mouth, pale skin, yellowing skin or eyes, unusual weakness or fatigue, fever, fast or irregular heartbeat, confusion, change in vision, trouble speaking, trouble walking Tumor lysis syndrome (TLS)--nausea, vomiting, diarrhea, decrease in the amount of urine, dark urine, unusual weakness or fatigue, confusion, muscle pain or cramps, fast or irregular heartbeat, joint pain Side effects that usually do not require medical attention (report to your care team if they continue or are bothersome): Constipation Diarrhea Fatigue Loss of appetite Nausea This list may not describe all possible side effects. Call your doctor for medical advice about side effects. You may report side effects to FDA at 1-800-FDA-1088. Where should I keep my medication? This medication is given in a hospital or clinic. It will not be stored at home. NOTE: This sheet is a summary. It may not cover all possible information. If you have questions about this medicine, talk to your doctor, pharmacist, or health care provider.  2024 Elsevier/Gold Standard (2022-01-06 00:00:00)  Daratumumab; Hyaluronidase Injection What is this medication? DARATUMUMAB; HYALURONIDASE (dar a toom ue mab; hye al ur ON i dase) treats multiple myeloma, a type of bone marrow cancer. Daratumumab works by blocking a protein that causes cancer cells to grow and multiply. This helps to slow or stop the spread of cancer cells. Hyaluronidase works by increasing the absorption of other medications in the body to help them work better. This medication may also be  used treat amyloidosis, a condition that causes the buildup of a protein (amyloid) in your body. It works by reducing the buildup of this protein, which decreases symptoms. It is a combination medication that contains a monoclonal antibody. This medicine may be used for other purposes; ask your health care provider or pharmacist if you have questions. COMMON BRAND NAME(S): DARZALEX FASPRO What should I tell my care team before I take this medication? They need to know if you have any of these conditions: Heart disease Infection, such as chickenpox, cold sores, herpes, hepatitis B Lung or breathing disease An unusual or allergic reaction to daratumumab, hyaluronidase, other medications, foods, dyes, or preservatives Pregnant or trying to get pregnant Breast-feeding How should I use this medication? This medication is injected under the skin. It is given by your care team in a hospital or clinic setting. Talk to your care team about the use of this medication in children. Special care may be needed. Overdosage: If you think you have taken too much of this medicine contact a poison control center or emergency room at once. NOTE: This medicine is only for you. Do not share this medicine with others. What if I  miss a dose? Keep appointments for follow-up doses. It is important not to miss your dose. Call your care team if you are unable to keep an appointment. What may interact with this medication? Interactions have not been studied. This list may not describe all possible interactions. Give your health care provider a list of all the medicines, herbs, non-prescription drugs, or dietary supplements you use. Also tell them if you smoke, drink alcohol, or use illegal drugs. Some items may interact with your medicine. What should I watch for while using this medication? Your condition will be monitored carefully while you are receiving this medication. This medication can cause serious allergic  reactions. To reduce your risk, your care team may give you other medication to take before receiving this one. Be sure to follow the directions from your care team. This medication can affect the results of blood tests to match your blood type. These changes can last for up to 6 months after the final dose. Your care team will do blood tests to match your blood type before you start treatment. Tell all of your care team that you are being treated with this medication before receiving a blood transfusion. This medication can affect the results of some tests used to determine treatment response; extra tests may be needed to evaluate response. Talk to your care team if you wish to become pregnant or think you are pregnant. This medication can cause serious birth defects if taken during pregnancy and for 3 months after the last dose. A reliable form of contraception is recommended while taking this medication and for 3 months after the last dose. Talk to your care team about effective forms of contraception. Do not breast-feed while taking this medication. What side effects may I notice from receiving this medication? Side effects that you should report to your care team as soon as possible: Allergic reactions--skin rash, itching, hives, swelling of the face, lips, tongue, or throat Heart rhythm changes--fast or irregular heartbeat, dizziness, feeling faint or lightheaded, chest pain, trouble breathing Infection--fever, chills, cough, sore throat, wounds that don't heal, pain or trouble when passing urine, general feeling of discomfort or being unwell Infusion reactions--chest pain, shortness of breath or trouble breathing, feeling faint or lightheaded Sudden eye pain or change in vision such as blurry vision, seeing halos around lights, vision loss Unusual bruising or bleeding Side effects that usually do not require medical attention (report to your care team if they continue or are  bothersome): Constipation Diarrhea Fatigue Nausea Pain, tingling, or numbness in the hands or feet Swelling of the ankles, hands, or feet This list may not describe all possible side effects. Call your doctor for medical advice about side effects. You may report side effects to FDA at 1-800-FDA-1088. Where should I keep my medication? This medication is given in a hospital or clinic. It will not be stored at home. NOTE: This sheet is a summary. It may not cover all possible information. If you have questions about this medicine, talk to your doctor, pharmacist, or health care provider.  2024 Elsevier/Gold Standard (2021-12-09 00:00:00)

## 2023-02-08 ENCOUNTER — Other Ambulatory Visit: Payer: Self-pay | Admitting: Nurse Practitioner

## 2023-02-09 ENCOUNTER — Ambulatory Visit
Admission: RE | Admit: 2023-02-09 | Discharge: 2023-02-09 | Disposition: A | Discharge status: Home / Self Care | Payer: Medicare Other | Source: Ambulatory Visit | Attending: Radiation Oncology | Admitting: Radiation Oncology

## 2023-02-09 NOTE — Progress Notes (Signed)
  Radiation Oncology         [phone] ________________________________  Name: Jeffery Higgins MRN: [id number]  Date of Service: 02/09/2023  DOB: Mar 10, 1956  Post Treatment Telephone Note  Diagnosis:  67 yo with multiple myeloma with symptomatic involvement C3 and right humeral head (as documented in provider EOT note)   The patient was available for call today.  The patient did not note fatigue during radiation. The patient did not note skin changes in the field of radiation during therapy. The patient has noticed improvement in pain in the area(s) treated with radiation. The patient is not taking dexamethasone. The patient does not have symptoms of  weakness or loss of control of the extremities. The patient does not have symptoms of headache. The patient does not have symptoms of seizure or uncontrolled movement. The patient does not have symptoms of changes in vision. The patient does not have changes in speech. The patient does not have confusion.   The patient is scheduled for ongoing care with Dr. Truett Perna in medical oncology. The patient was encouraged to call if he develops concerns or questions regarding radiation.  This concludes the interaction.  Ruel Favors, LPN

## 2023-02-10 ENCOUNTER — Telehealth: Payer: Self-pay | Admitting: Oncology

## 2023-02-10 ENCOUNTER — Other Ambulatory Visit: Payer: Self-pay

## 2023-02-10 NOTE — Telephone Encounter (Signed)
Called patient regarding 06/28 appointments, patient is notified.

## 2023-02-12 ENCOUNTER — Inpatient Hospital Stay: Payer: Medicare Other

## 2023-02-12 ENCOUNTER — Inpatient Hospital Stay (HOSPITAL_BASED_OUTPATIENT_CLINIC_OR_DEPARTMENT_OTHER): Payer: Medicare Other | Admitting: Nurse Practitioner

## 2023-02-12 ENCOUNTER — Inpatient Hospital Stay: Payer: Medicare Other | Admitting: Nurse Practitioner

## 2023-02-12 ENCOUNTER — Encounter: Payer: Self-pay | Admitting: Nurse Practitioner

## 2023-02-12 DIAGNOSIS — C9000 Multiple myeloma not having achieved remission: Secondary | ICD-10-CM

## 2023-02-12 LAB — CBC WITH DIFFERENTIAL (CANCER CENTER ONLY)
Abs Immature Granulocytes: 0.01 10*3/uL (ref 0.00–0.07)
Basophils Absolute: 0.1 10*3/uL (ref 0.0–0.1)
Basophils Relative: 1 %
Eosinophils Absolute: 0.4 10*3/uL (ref 0.0–0.5)
Eosinophils Relative: 6 %
HCT: 41.8 % (ref 39.0–52.0)
Hemoglobin: 13.8 g/dL (ref 13.0–17.0)
Immature Granulocytes: 0 %
Lymphocytes Relative: 13 %
Lymphs Abs: 0.9 10*3/uL (ref 0.7–4.0)
MCH: 29.7 pg (ref 26.0–34.0)
MCHC: 33 g/dL (ref 30.0–36.0)
MCV: 90.1 fL (ref 80.0–100.0)
Monocytes Absolute: 0.7 10*3/uL (ref 0.1–1.0)
Monocytes Relative: 11 %
Neutro Abs: 4.7 10*3/uL (ref 1.7–7.7)
Neutrophils Relative %: 69 %
Platelet Count: 130 10*3/uL — ABNORMAL LOW (ref 150–400)
RBC: 4.64 MIL/uL (ref 4.22–5.81)
RDW: 16.9 % — ABNORMAL HIGH (ref 11.5–15.5)
WBC Count: 6.8 10*3/uL (ref 4.0–10.5)
nRBC: 0 % (ref 0.0–0.2)

## 2023-02-12 LAB — CMP (CANCER CENTER ONLY)
ALT: 14 U/L (ref 0–44)
AST: 13 U/L — ABNORMAL LOW (ref 15–41)
Albumin: 4 g/dL (ref 3.5–5.0)
Alkaline Phosphatase: 75 U/L (ref 38–126)
Anion gap: 6 (ref 5–15)
BUN: 13 mg/dL (ref 8–23)
CO2: 27 mmol/L (ref 22–32)
Calcium: 8.7 mg/dL — ABNORMAL LOW (ref 8.9–10.3)
Chloride: 107 mmol/L (ref 98–111)
Creatinine: 0.81 mg/dL (ref 0.61–1.24)
GFR, Estimated: >60 mL/min (ref >60)
Glucose, Bld: 130 mg/dL — ABNORMAL HIGH (ref 70–99)
Potassium: 3.8 mmol/L (ref 3.5–5.1)
Sodium: 140 mmol/L (ref 135–145)
Total Bilirubin: 0.6 mg/dL (ref 0.3–1.2)
Total Protein: 6.8 g/dL (ref 6.5–8.1)

## 2023-02-12 MED ORDER — DARATUMUMAB-HYALURONIDASE-FIHJ 1800-30000 MG-UT/15ML ~~LOC~~ SOLN
1800.0000 mg | Freq: Once | SUBCUTANEOUS | Status: AC
  Administered 2023-02-12: 1800 mg via SUBCUTANEOUS
  Filled 2023-02-12: qty 15

## 2023-02-12 MED ORDER — BORTEZOMIB CHEMO SQ INJECTION 3.5 MG (2.5MG/ML)
1.3000 mg/m2 | Freq: Once | INTRAMUSCULAR | Status: AC
  Administered 2023-02-12: 3 mg via SUBCUTANEOUS
  Filled 2023-02-12: qty 1.2

## 2023-02-12 MED ORDER — DEXAMETHASONE 4 MG PO TABS
10.0000 mg | ORAL_TABLET | Freq: Once | ORAL | Status: AC
  Administered 2023-02-12: 10 mg via ORAL
  Filled 2023-02-12: qty 3

## 2023-02-12 MED ORDER — DIPHENHYDRAMINE HCL 25 MG PO CAPS
25.0000 mg | ORAL_CAPSULE | Freq: Once | ORAL | Status: AC
  Administered 2023-02-12: 25 mg via ORAL
  Filled 2023-02-12: qty 1

## 2023-02-12 MED ORDER — ACETAMINOPHEN 325 MG PO TABS
650.0000 mg | ORAL_TABLET | Freq: Once | ORAL | Status: AC
  Administered 2023-02-12: 650 mg via ORAL
  Filled 2023-02-12: qty 2

## 2023-02-12 NOTE — Patient Instructions (Signed)
[facility] [address]   Discharge Instructions: Thank you for choosing [facility] to provide your oncology and hematology care.   If you have a lab appointment with the Cancer Center, please go directly to the Cancer Center and check in at the registration area.   Wear comfortable clothing and clothing appropriate for easy access to any Portacath or PICC line.   We strive to give you quality time with your provider. You may need to reschedule your appointment if you arrive late (15 or more minutes).  Arriving late affects you and other patients whose appointments are after yours.  Also, if you miss three or more appointments without notifying the office, you may be dismissed from the clinic at the provider's discretion.      For prescription refill requests, have your pharmacy contact our office and allow 72 hours for refills to be completed.    Today you received the following chemotherapy and/or immunotherapy agents Velcade, Darzalex-Faspro.      To help prevent nausea and vomiting after your treatment, we encourage you to take your nausea medication as directed.  BELOW ARE SYMPTOMS THAT SHOULD BE REPORTED IMMEDIATELY: *FEVER GREATER THAN 100.4 F (38 C) OR HIGHER *CHILLS OR SWEATING *NAUSEA AND VOMITING THAT IS NOT CONTROLLED WITH YOUR NAUSEA MEDICATION *UNUSUAL SHORTNESS OF BREATH *UNUSUAL BRUISING OR BLEEDING *URINARY PROBLEMS (pain or burning when urinating, or frequent urination) *BOWEL PROBLEMS (unusual diarrhea, constipation, pain near the anus) TENDERNESS IN MOUTH AND THROAT WITH OR WITHOUT PRESENCE OF ULCERS (sore throat, sores in mouth, or a toothache) UNUSUAL RASH, SWELLING OR PAIN  UNUSUAL VAGINAL DISCHARGE OR ITCHING   Items with * indicate a potential emergency and should be followed up as soon as possible or go to the Emergency Department if any problems should occur.  Please show the CHEMOTHERAPY ALERT CARD or IMMUNOTHERAPY  ALERT CARD at check-in to the Emergency Department and triage nurse.  Should you have questions after your visit or need to cancel or reschedule your appointment, please contact [facility] [address] [address]  Dept: [phone]  and follow the prompts.  Office hours are 8:00 a.m. to 4:30 p.m. Monday - Friday. Please note that voicemails left after 4:00 p.m. may not be returned until the following business day.  We are closed weekends and major holidays. You have access to a nurse at all times for urgent questions. Please call the main number to the clinic Dept: [phone] and follow the prompts.   For any non-urgent questions, you may also contact your provider using MyChart. We now offer e-Visits for anyone 18 and older to request care online for non-urgent symptoms. For details visit [email or web address].   Also download the MyChart app! Go to the app store, search "MyChart", open the app, select , and log in with your MyChart username and password.  Bortezomib Injection What is this medication? BORTEZOMIB (bor TEZ oh mib) treats lymphoma. It may also be used to treat multiple myeloma, a type of bone marrow cancer. It works by blocking a protein that causes cancer cells to grow and multiply. This helps to slow or stop the spread of cancer cells. This medicine may be used for other purposes; ask your health care provider or pharmacist if you have questions. COMMON BRAND NAME(S): Velcade What should I tell my care team before I take this medication? They need to know if you have any of these conditions: Dehydration Diabetes Heart disease Liver disease Tingling   of the fingers or toes or other nerve disorder An unusual or allergic reaction to bortezomib, other medications, foods, dyes, or preservatives If you or your partner are pregnant or trying to get pregnant Breastfeeding How should I use this medication? This medication is injected into a vein or under  the skin. It is given by your care team in a hospital or clinic setting. Talk to your care team about the use of this medication in children. Special care may be needed. Overdosage: If you think you have taken too much of this medicine contact a poison control center or emergency room at once. NOTE: This medicine is only for you. Do not share this medicine with others. What if I miss a dose? Keep appointments for follow-up doses. It is important not to miss your dose. Call your care team if you are unable to keep an appointment. What may interact with this medication? Ketoconazole Rifampin This list may not describe all possible interactions. Give your health care provider a list of all the medicines, herbs, non-prescription drugs, or dietary supplements you use. Also tell them if you smoke, drink alcohol, or use illegal drugs. Some items may interact with your medicine. What should I watch for while using this medication? Your condition will be monitored carefully while you are receiving this medication. You may need blood work while taking this medication. This medication may affect your coordination, reaction time, or judgment. Do not drive or operate machinery until you know how this medication affects you. Sit up or stand slowly to reduce the risk of dizzy or fainting spells. Drinking alcohol with this medication can increase the risk of these side effects. This medication may increase your risk of getting an infection. Call your care team for advice if you get a fever, chills, sore throat, or other symptoms of a cold or flu. Do not treat yourself. Try to avoid being around people who are sick. Check with your care team if you have severe diarrhea, nausea, and vomiting, or if you sweat a lot. The loss of too much body fluid may make it dangerous for you to take this medication. Talk to your care team if you may be pregnant. Serious birth defects can occur if you take this medication during  pregnancy and for 7 months after the last dose. You will need a negative pregnancy test before starting this medication. Contraception is recommended while taking this medication and for 7 months after the last dose. Your care team can help you find the option that works for you. If your partner can get pregnant, use a condom during sex while taking this medication and for 4 months after the last dose. Do not breastfeed while taking this medication and for 2 months after the last dose. This medication may cause infertility. Talk to your care team if you are concerned about your fertility. What side effects may I notice from receiving this medication? Side effects that you should report to your care team as soon as possible: Allergic reactions--skin rash, itching, hives, swelling of the face, lips, tongue, or throat Bleeding--bloody or black, tar-like stools, vomiting blood or brown material that looks like coffee grounds, red or dark brown urine, small red or purple spots on skin, unusual bruising or bleeding Bleeding in the brain--severe headache, stiff neck, confusion, dizziness, change in vision, numbness or weakness of the face, arm, or leg, trouble speaking, trouble walking, vomiting Bowel blockage--stomach cramping, unable to have a bowel movement or pass gas, loss of   appetite, vomiting Heart failure--shortness of breath, swelling of the ankles, feet, or hands, sudden weight gain, unusual weakness or fatigue Infection--fever, chills, cough, sore throat, wounds that don't heal, pain or trouble when passing urine, general feeling of discomfort or being unwell Liver injury--right upper belly pain, loss of appetite, nausea, light-colored stool, dark yellow or brown urine, yellowing skin or eyes, unusual weakness or fatigue Low blood pressure--dizziness, feeling faint or lightheaded, blurry vision Lung injury--shortness of breath or trouble breathing, cough, spitting up blood, chest pain, fever Pain,  tingling, or numbness in the hands or feet Severe or prolonged diarrhea Stomach pain, bloody diarrhea, pale skin, unusual weakness or fatigue, decrease in the amount of urine, which may be signs of hemolytic uremic syndrome Sudden and severe headache, confusion, change in vision, seizures, which may be signs of posterior reversible encephalopathy syndrome (PRES) TTP--purple spots on the skin or inside the mouth, pale skin, yellowing skin or eyes, unusual weakness or fatigue, fever, fast or irregular heartbeat, confusion, change in vision, trouble speaking, trouble walking Tumor lysis syndrome (TLS)--nausea, vomiting, diarrhea, decrease in the amount of urine, dark urine, unusual weakness or fatigue, confusion, muscle pain or cramps, fast or irregular heartbeat, joint pain Side effects that usually do not require medical attention (report to your care team if they continue or are bothersome): Constipation Diarrhea Fatigue Loss of appetite Nausea This list may not describe all possible side effects. Call your doctor for medical advice about side effects. You may report side effects to FDA at 1-800-FDA-1088. Where should I keep my medication? This medication is given in a hospital or clinic. It will not be stored at home. NOTE: This sheet is a summary. It may not cover all possible information. If you have questions about this medicine, talk to your doctor, pharmacist, or health care provider.  2024 Elsevier/Gold Standard (2022-01-06 00:00:00)  Daratumumab; Hyaluronidase Injection What is this medication? DARATUMUMAB; HYALURONIDASE (dar a toom ue mab; hye al ur ON i dase) treats multiple myeloma, a type of bone marrow cancer. Daratumumab works by blocking a protein that causes cancer cells to grow and multiply. This helps to slow or stop the spread of cancer cells. Hyaluronidase works by increasing the absorption of other medications in the body to help them work better. This medication may also be  used treat amyloidosis, a condition that causes the buildup of a protein (amyloid) in your body. It works by reducing the buildup of this protein, which decreases symptoms. It is a combination medication that contains a monoclonal antibody. This medicine may be used for other purposes; ask your health care provider or pharmacist if you have questions. COMMON BRAND NAME(S): DARZALEX FASPRO What should I tell my care team before I take this medication? They need to know if you have any of these conditions: Heart disease Infection, such as chickenpox, cold sores, herpes, hepatitis B Lung or breathing disease An unusual or allergic reaction to daratumumab, hyaluronidase, other medications, foods, dyes, or preservatives Pregnant or trying to get pregnant Breast-feeding How should I use this medication? This medication is injected under the skin. It is given by your care team in a hospital or clinic setting. Talk to your care team about the use of this medication in children. Special care may be needed. Overdosage: If you think you have taken too much of this medicine contact a poison control center or emergency room at once. NOTE: This medicine is only for you. Do not share this medicine with others. What if I   miss a dose? Keep appointments for follow-up doses. It is important not to miss your dose. Call your care team if you are unable to keep an appointment. What may interact with this medication? Interactions have not been studied. This list may not describe all possible interactions. Give your health care provider a list of all the medicines, herbs, non-prescription drugs, or dietary supplements you use. Also tell them if you smoke, drink alcohol, or use illegal drugs. Some items may interact with your medicine. What should I watch for while using this medication? Your condition will be monitored carefully while you are receiving this medication. This medication can cause serious allergic  reactions. To reduce your risk, your care team may give you other medication to take before receiving this one. Be sure to follow the directions from your care team. This medication can affect the results of blood tests to match your blood type. These changes can last for up to 6 months after the final dose. Your care team will do blood tests to match your blood type before you start treatment. Tell all of your care team that you are being treated with this medication before receiving a blood transfusion. This medication can affect the results of some tests used to determine treatment response; extra tests may be needed to evaluate response. Talk to your care team if you wish to become pregnant or think you are pregnant. This medication can cause serious birth defects if taken during pregnancy and for 3 months after the last dose. A reliable form of contraception is recommended while taking this medication and for 3 months after the last dose. Talk to your care team about effective forms of contraception. Do not breast-feed while taking this medication. What side effects may I notice from receiving this medication? Side effects that you should report to your care team as soon as possible: Allergic reactions--skin rash, itching, hives, swelling of the face, lips, tongue, or throat Heart rhythm changes--fast or irregular heartbeat, dizziness, feeling faint or lightheaded, chest pain, trouble breathing Infection--fever, chills, cough, sore throat, wounds that don't heal, pain or trouble when passing urine, general feeling of discomfort or being unwell Infusion reactions--chest pain, shortness of breath or trouble breathing, feeling faint or lightheaded Sudden eye pain or change in vision such as blurry vision, seeing halos around lights, vision loss Unusual bruising or bleeding Side effects that usually do not require medical attention (report to your care team if they continue or are  bothersome): Constipation Diarrhea Fatigue Nausea Pain, tingling, or numbness in the hands or feet Swelling of the ankles, hands, or feet This list may not describe all possible side effects. Call your doctor for medical advice about side effects. You may report side effects to FDA at 1-800-FDA-1088. Where should I keep my medication? This medication is given in a hospital or clinic. It will not be stored at home. NOTE: This sheet is a summary. It may not cover all possible information. If you have questions about this medicine, talk to your doctor, pharmacist, or health care provider.  2024 Elsevier/Gold Standard (2021-12-09 00:00:00)  

## 2023-02-12 NOTE — Progress Notes (Signed)
[facility] OFFICE PROGRESS NOTE   Diagnosis: Multiple myeloma  INTERVAL HISTORY:   Mr. Kapler returns as scheduled.  He began cycle 2 daratumumab-VRD 01/29/2023.  He denies nausea/vomiting.  No mouth sores.  No diarrhea.  No rash.  Intermittent pruritus at the left lower lateral back region.  He intermittently notes mild tingling in the hands in the mornings and evenings.  No persistent symptoms.  No cough, shortness of breath or wheezing following treatment.  His family notes that he becomes irritated and easily frustrated several days after each weekly treatment.  They further report he had some issues with this prior to his diagnosis.  Objective:  Vital signs in last 24 hours:  Blood pressure (!) 141/75, pulse 79, temperature 97.9 F (36.6 C), temperature source Oral, resp. rate 18, height 5\' 10"  (1.778 m), weight 242 lb (109.8 kg), SpO2 98 %.    HEENT: No thrush or ulcers. Resp: Lungs clear bilaterally. Cardio: Regular rate and rhythm. GI: No hepatosplenomegaly. Vascular: No leg edema.  Skin: No rash.  Specifically no rash at the left lower back.   Lab Results:  Lab Results  Component Value Date   WBC 5.9 02/05/2023   HGB 13.3 02/05/2023   HCT 40.7 02/05/2023   MCV 90.4 02/05/2023   PLT 151 02/05/2023   NEUTROABS 4.0 02/05/2023    Imaging:  No results found.  Medications: I have reviewed the patient's current medications.  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 numerous enhancing lesions of the skull base and calvarium no evidence of  extraosseous component 12/17/2022-MRI of the cervical, thoracic, and lumbar spine-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, clivus, bilateral occipital condyles, ribs, sacrum, and iliac bone, no evidence of pathologic fracture, expansile lesion in C3 extends into the spinal canal by 4 mm with moderate to severe spinal canal stenosis posterior to C3, mild right foraminal narrowing at T2-T3 and T3-4 related to osseous tumor at T3 12/17/2022-SPEP-2.6 g serum M spike, IgG kappa Bone marrow biopsy 12/18/2022-multiple myeloma with plasma cells involving 60% of the cellular marrow, kappa restricted myeloma FISH panel-QNS Bone survey 12/18/2022-lucencies at the skull, right scapula, left clavicle, lumbar spine, pelvis, and left femoral neck.  Destructive lesion at C3, possible sclerotic lucent lesions in the thoracic spine 12/18/2022 pelvic radiation to C3 and humeral head-12/31/2022 Cycle 1 daratumumab-VRD 01/01/2023 (Velcade 3 weeks on/1 week off) Cycle 2 daratumumab-VRD 01/29/2023   2.  Left shoulder pain-likely secondary to a lytic lesion at the left humeral head/neck 3.  Left chin/distal mandible numbness-likely secondary to myeloma compressing nerve roots at the brainstem or left face 4.  Hypertension 5.  Diabetes 6.  Sleep apnea 7.  Nephrolithiasis-right ureteral calculus urinary tract infection, placement of a right ureter stent right stone dislodgment 10/01/2022 8.  Asthma  Disposition: Mr. Nykaza appears stable.  He is completing cycle 2 daratumumab-VRD.  Overall seems to be tolerating treatment well.  His family notes mood changes with some anger and easy frustration a few days after each treatment.  We will try decreasing the dexamethasone dose from 20 mg to 10 mg with today's treatment.  CBC and chemistry panel reviewed.  Labs adequate to proceed.  We will see him  in follow-up in 2 weeks prior to beginning cycle 3 daratumumab-VRD.    Lonna Cobb ANP/GNP-BC   02/12/2023  11:24  AM

## 2023-02-12 NOTE — Progress Notes (Signed)
Patient seen by Lonna Cobb NP today  Vitals are within treatment parameters.  Labs reviewed by Lonna Cobb NP and are within treatment parameters.  Per physician team, patient is ready for treatment. Please note that modifications are being made to the treatment plan including decease decadron to 10mg 

## 2023-02-14 ENCOUNTER — Other Ambulatory Visit: Payer: Self-pay | Admitting: Oncology

## 2023-02-17 ENCOUNTER — Telehealth: Payer: Self-pay | Admitting: *Deleted

## 2023-02-17 NOTE — Telephone Encounter (Signed)
Per Dr. Jerel Shepherd could be from steroid or Revlimid. Suggests he hold Revlimid and tell treatment nurse on 7/05 if rash has improved or not. Continue OTC benadryl po and topical.

## 2023-02-17 NOTE — Telephone Encounter (Signed)
Reports development of rash that has worsened over last 48 hours. Located on his back, shoulders and buttocks w/significant pruritus not responding well to OTC Benadryl. Denies any new detergent/soap or environmental exposures. Asking if there is anything to be done for his comfort? Noted that his dexamethasone and benadryl doses were reduced on tx date 02/12/23. He is still on Revlimid as well.

## 2023-02-19 ENCOUNTER — Inpatient Hospital Stay: Payer: Medicare Other | Attending: Nurse Practitioner

## 2023-02-19 ENCOUNTER — Encounter: Payer: Self-pay | Admitting: *Deleted

## 2023-02-19 ENCOUNTER — Inpatient Hospital Stay: Payer: Medicare Other

## 2023-02-19 VITALS — BP 147/74 | HR 69 | Temp 97.0°F | Resp 18 | Ht 70.0 in | Wt 245.6 lb

## 2023-02-19 DIAGNOSIS — G4730 Sleep apnea, unspecified: Secondary | ICD-10-CM | POA: Insufficient documentation

## 2023-02-19 DIAGNOSIS — Z87440 Personal history of urinary (tract) infections: Secondary | ICD-10-CM | POA: Diagnosis not present

## 2023-02-19 DIAGNOSIS — R21 Rash and other nonspecific skin eruption: Secondary | ICD-10-CM | POA: Diagnosis not present

## 2023-02-19 DIAGNOSIS — C9000 Multiple myeloma not having achieved remission: Secondary | ICD-10-CM | POA: Diagnosis present

## 2023-02-19 DIAGNOSIS — N390 Urinary tract infection, site not specified: Secondary | ICD-10-CM | POA: Insufficient documentation

## 2023-02-19 DIAGNOSIS — N202 Calculus of kidney with calculus of ureter: Secondary | ICD-10-CM | POA: Insufficient documentation

## 2023-02-19 DIAGNOSIS — E119 Type 2 diabetes mellitus without complications: Secondary | ICD-10-CM | POA: Diagnosis not present

## 2023-02-19 DIAGNOSIS — I10 Essential (primary) hypertension: Secondary | ICD-10-CM | POA: Insufficient documentation

## 2023-02-19 DIAGNOSIS — M25512 Pain in left shoulder: Secondary | ICD-10-CM | POA: Diagnosis not present

## 2023-02-19 DIAGNOSIS — Z5112 Encounter for antineoplastic immunotherapy: Secondary | ICD-10-CM | POA: Diagnosis not present

## 2023-02-19 LAB — CBC WITH DIFFERENTIAL (CANCER CENTER ONLY)
Abs Immature Granulocytes: 0.02 10*3/uL (ref 0.00–0.07)
Basophils Absolute: 0.1 10*3/uL (ref 0.0–0.1)
Basophils Relative: 1 %
Eosinophils Absolute: 0.7 10*3/uL — ABNORMAL HIGH (ref 0.0–0.5)
Eosinophils Relative: 13 %
HCT: 40.9 % (ref 39.0–52.0)
Hemoglobin: 13.4 g/dL (ref 13.0–17.0)
Immature Granulocytes: 0 %
Lymphocytes Relative: 15 %
Lymphs Abs: 0.9 10*3/uL (ref 0.7–4.0)
MCH: 30 pg (ref 26.0–34.0)
MCHC: 32.8 g/dL (ref 30.0–36.0)
MCV: 91.7 fL (ref 80.0–100.0)
Monocytes Absolute: 0.8 10*3/uL (ref 0.1–1.0)
Monocytes Relative: 14 %
Neutro Abs: 3.2 10*3/uL (ref 1.7–7.7)
Neutrophils Relative %: 57 %
Platelet Count: 125 10*3/uL — ABNORMAL LOW (ref 150–400)
RBC: 4.46 MIL/uL (ref 4.22–5.81)
RDW: 17.2 % — ABNORMAL HIGH (ref 11.5–15.5)
WBC Count: 5.7 10*3/uL (ref 4.0–10.5)
nRBC: 0 % (ref 0.0–0.2)

## 2023-02-19 MED ORDER — ACETAMINOPHEN 325 MG PO TABS
650.0000 mg | ORAL_TABLET | Freq: Once | ORAL | Status: AC
  Administered 2023-02-19: 650 mg via ORAL
  Filled 2023-02-19: qty 2

## 2023-02-19 MED ORDER — DEXAMETHASONE 4 MG PO TABS
10.0000 mg | ORAL_TABLET | Freq: Once | ORAL | Status: AC
  Administered 2023-02-19: 10 mg via ORAL
  Filled 2023-02-19: qty 3

## 2023-02-19 MED ORDER — DARATUMUMAB-HYALURONIDASE-FIHJ 1800-30000 MG-UT/15ML ~~LOC~~ SOLN
1800.0000 mg | Freq: Once | SUBCUTANEOUS | Status: AC
  Administered 2023-02-19: 1800 mg via SUBCUTANEOUS
  Filled 2023-02-19: qty 15

## 2023-02-19 MED ORDER — DIPHENHYDRAMINE HCL 25 MG PO CAPS
25.0000 mg | ORAL_CAPSULE | Freq: Once | ORAL | Status: AC
  Administered 2023-02-19: 25 mg via ORAL
  Filled 2023-02-19: qty 1

## 2023-02-19 NOTE — Progress Notes (Signed)
Jeffery Higgins contacted the office 02/17/2023 to report a skin rash located on back, shoulders, buttocks with associated pruritus.  He was instructed to hold Revlimid.  He presents today for daratumumab.  He reports the rash has improved since placing Revlimid on hold, continues to note pruritus.  Flat mildly erythematous rash upper arms, lower back, buttocks, very faint erythema upper abdomen.  The rash is likely due to Revlimid.  He understands to continue to hold Revlimid for the remainder of this cycle, contact the office if the rash progresses/worsens.  Plan to proceed with daratumumab today as scheduled.  We will see him in follow-up 02/26/2023.

## 2023-02-19 NOTE — Progress Notes (Signed)
Received VM from [facility] requesting refill on Revlimid. Per NP: Hold off on refill till he is seen again and his rash is re-evaluated.

## 2023-02-19 NOTE — Progress Notes (Signed)
Roxan Diesel, NP notified patient here for Darzalex Faspro today. Itching continues, he has a rash on his arms abdomen and back. He reminds [place] that we reduced his decadron and benadryl last treatment. He reports taking benadryl at home and using cream. It helps some but reports that the itching is really bad at times rating it an 11 pain wise. He is wondering if this reduction has caused the rash and does he need have the original dose resumed.

## 2023-02-19 NOTE — Progress Notes (Signed)
Roxan Diesel, NP at chairside OK to Treat

## 2023-02-19 NOTE — Progress Notes (Signed)
No additional premeds or changes - rash thought to be from Revlimid by Lonna Cobb, NP  Pryor Ochoa, PharmD

## 2023-02-19 NOTE — Patient Instructions (Signed)
[facility]   Discharge Instructions: Thank you for choosing [facility] to provide your oncology and hematology care.   If you have a lab appointment with the Cancer Center, please go directly to the Cancer Center and check in at the registration area.   Wear comfortable clothing and clothing appropriate for easy access to any Portacath or PICC line.   We strive to give you quality time with your provider. You may need to reschedule your appointment if you arrive late (15 or more minutes).  Arriving late affects you and other patients whose appointments are after yours.  Also, if you miss three or more appointments without notifying the office, you may be dismissed from the clinic at the provider's discretion.      For prescription refill requests, have your pharmacy contact our office and allow 72 hours for refills to be completed.    Today you received the following chemotherapy and/or immunotherapy agents Darzalex-Faspro      To help prevent nausea and vomiting after your treatment, we encourage you to take your nausea medication as directed.  BELOW ARE SYMPTOMS THAT SHOULD BE REPORTED IMMEDIATELY: *FEVER GREATER THAN 100.4 F (38 C) OR HIGHER *CHILLS OR SWEATING *NAUSEA AND VOMITING THAT IS NOT CONTROLLED WITH YOUR NAUSEA MEDICATION *UNUSUAL SHORTNESS OF BREATH *UNUSUAL BRUISING OR BLEEDING *URINARY PROBLEMS (pain or burning when urinating, or frequent urination) *BOWEL PROBLEMS (unusual diarrhea, constipation, pain near the anus) TENDERNESS IN MOUTH AND THROAT WITH OR WITHOUT PRESENCE OF ULCERS (sore throat, sores in mouth, or a toothache) UNUSUAL RASH, SWELLING OR PAIN  UNUSUAL VAGINAL DISCHARGE OR ITCHING   Items with * indicate a potential emergency and should be followed up as soon as possible or go to the Emergency Department if any problems should occur.  Please show the CHEMOTHERAPY ALERT CARD or IMMUNOTHERAPY ALERT CARD at  check-in to the Emergency Department and triage nurse.  Should you have questions after your visit or need to cancel or reschedule your appointment, please contact [facility]  Dept: [phone]  and follow the prompts.  Office hours are 8:00 a.m. to 4:30 p.m. Monday - Friday. Please note that voicemails left after 4:00 p.m. may not be returned until the following business day.  We are closed weekends and major holidays. You have access to a nurse at all times for urgent questions. Please call the main number to the clinic Dept: [phone] and follow the prompts.   For any non-urgent questions, you may also contact your provider using MyChart. We now offer e-Visits for anyone 18 and older to request care online for non-urgent symptoms. For details visit [email or web address].   Also download the MyChart app! Go to the app store, search "MyChart", open the app, select [facility], and log in with your MyChart username and password.  Daratumumab; Hyaluronidase Injection What is this medication? DARATUMUMAB; HYALURONIDASE (dar a toom ue mab; hye al ur ON i dase) treats multiple myeloma, a type of bone marrow cancer. Daratumumab works by blocking a protein that causes cancer cells to grow and multiply. This helps to slow or stop the spread of cancer cells. Hyaluronidase works by increasing the absorption of other medications in the body to help them work better. This medication may also be used treat amyloidosis, a condition that causes the buildup of a protein (amyloid) in your body. It works by reducing the buildup of this protein, which decreases symptoms. It is a   combination medication that contains a monoclonal antibody. This medicine may be used for other purposes; ask your health care provider or pharmacist if you have questions. COMMON BRAND NAME(S): DARZALEX FASPRO What should I tell my care team before I take this medication? They need to know if you have  any of these conditions: Heart disease Infection, such as chickenpox, cold sores, herpes, hepatitis B Lung or breathing disease An unusual or allergic reaction to daratumumab, hyaluronidase, other medications, foods, dyes, or preservatives Pregnant or trying to get pregnant Breast-feeding How should I use this medication? This medication is injected under the skin. It is given by your care team in a hospital or clinic setting. Talk to your care team about the use of this medication in children. Special care may be needed. Overdosage: If you think you have taken too much of this medicine contact a poison control center or emergency room at once. NOTE: This medicine is only for you. Do not share this medicine with others. What if I miss a dose? Keep appointments for follow-up doses. It is important not to miss your dose. Call your care team if you are unable to keep an appointment. What may interact with this medication? Interactions have not been studied. This list may not describe all possible interactions. Give your health care provider a list of all the medicines, herbs, non-prescription drugs, or dietary supplements you use. Also tell them if you smoke, drink alcohol, or use illegal drugs. Some items may interact with your medicine. What should I watch for while using this medication? Your condition will be monitored carefully while you are receiving this medication. This medication can cause serious allergic reactions. To reduce your risk, your care team may give you other medication to take before receiving this one. Be sure to follow the directions from your care team. This medication can affect the results of blood tests to match your blood type. These changes can last for up to 6 months after the final dose. Your care team will do blood tests to match your blood type before you start treatment. Tell all of your care team that you are being treated with this medication before receiving a  blood transfusion. This medication can affect the results of some tests used to determine treatment response; extra tests may be needed to evaluate response. Talk to your care team if you wish to become pregnant or think you are pregnant. This medication can cause serious birth defects if taken during pregnancy and for 3 months after the last dose. A reliable form of contraception is recommended while taking this medication and for 3 months after the last dose. Talk to your care team about effective forms of contraception. Do not breast-feed while taking this medication. What side effects may I notice from receiving this medication? Side effects that you should report to your care team as soon as possible: Allergic reactions--skin rash, itching, hives, swelling of the face, lips, tongue, or throat Heart rhythm changes--fast or irregular heartbeat, dizziness, feeling faint or lightheaded, chest pain, trouble breathing Infection--fever, chills, cough, sore throat, wounds that don't heal, pain or trouble when passing urine, general feeling of discomfort or being unwell Infusion reactions--chest pain, shortness of breath or trouble breathing, feeling faint or lightheaded Sudden eye pain or change in vision such as blurry vision, seeing halos around lights, vision loss Unusual bruising or bleeding Side effects that usually do not require medical attention (report to your care team if they continue or are bothersome): Constipation   Diarrhea Fatigue Nausea Pain, tingling, or numbness in the hands or feet Swelling of the ankles, hands, or feet This list may not describe all possible side effects. Call your doctor for medical advice about side effects. You may report side effects to FDA at 1-800-FDA-1088. Where should I keep my medication? This medication is given in a hospital or clinic. It will not be stored at home. NOTE: This sheet is a summary. It may not cover all possible information. If you have  questions about this medicine, talk to your doctor, pharmacist, or health care provider.  2024 Elsevier/Gold Standard (2021-12-09 00:00:00)  

## 2023-02-21 ENCOUNTER — Other Ambulatory Visit: Payer: Self-pay | Admitting: Oncology

## 2023-02-21 DIAGNOSIS — C9000 Multiple myeloma not having achieved remission: Secondary | ICD-10-CM

## 2023-02-26 ENCOUNTER — Encounter: Payer: Self-pay | Admitting: *Deleted

## 2023-02-26 ENCOUNTER — Inpatient Hospital Stay: Payer: Medicare Other

## 2023-02-26 ENCOUNTER — Other Ambulatory Visit: Payer: Self-pay | Admitting: *Deleted

## 2023-02-26 ENCOUNTER — Inpatient Hospital Stay (HOSPITAL_BASED_OUTPATIENT_CLINIC_OR_DEPARTMENT_OTHER): Payer: Medicare Other | Admitting: Oncology

## 2023-02-26 VITALS — BP 139/73 | HR 65 | Temp 98.2°F | Resp 18 | Ht 70.0 in | Wt 246.3 lb

## 2023-02-26 DIAGNOSIS — C9000 Multiple myeloma not having achieved remission: Secondary | ICD-10-CM

## 2023-02-26 LAB — CBC WITH DIFFERENTIAL (CANCER CENTER ONLY)
Abs Immature Granulocytes: 0.01 10*3/uL (ref 0.00–0.07)
Basophils Absolute: 0.1 10*3/uL (ref 0.0–0.1)
Basophils Relative: 2 %
Eosinophils Absolute: 0.6 10*3/uL — ABNORMAL HIGH (ref 0.0–0.5)
Eosinophils Relative: 9 %
HCT: 39.5 % (ref 39.0–52.0)
Hemoglobin: 13 g/dL (ref 13.0–17.0)
Immature Granulocytes: 0 %
Lymphocytes Relative: 17 %
Lymphs Abs: 1.1 10*3/uL (ref 0.7–4.0)
MCH: 30.1 pg (ref 26.0–34.0)
MCHC: 32.9 g/dL (ref 30.0–36.0)
MCV: 91.4 fL (ref 80.0–100.0)
Monocytes Absolute: 0.8 10*3/uL (ref 0.1–1.0)
Monocytes Relative: 13 %
Neutro Abs: 3.7 10*3/uL (ref 1.7–7.7)
Neutrophils Relative %: 59 %
Platelet Count: 191 10*3/uL (ref 150–400)
RBC: 4.32 MIL/uL (ref 4.22–5.81)
RDW: 17.6 % — ABNORMAL HIGH (ref 11.5–15.5)
WBC Count: 6.3 10*3/uL (ref 4.0–10.5)
nRBC: 0 % (ref 0.0–0.2)

## 2023-02-26 LAB — CMP (CANCER CENTER ONLY)
ALT: 11 U/L (ref 0–44)
AST: 11 U/L — ABNORMAL LOW (ref 15–41)
Albumin: 4 g/dL (ref 3.5–5.0)
Alkaline Phosphatase: 58 U/L (ref 38–126)
Anion gap: 7 (ref 5–15)
BUN: 18 mg/dL (ref 8–23)
CO2: 26 mmol/L (ref 22–32)
Calcium: 8.7 mg/dL — ABNORMAL LOW (ref 8.9–10.3)
Chloride: 107 mmol/L (ref 98–111)
Creatinine: 0.8 mg/dL (ref 0.61–1.24)
GFR, Estimated: >60 mL/min (ref >60)
Glucose, Bld: 140 mg/dL — ABNORMAL HIGH (ref 70–99)
Potassium: 3.8 mmol/L (ref 3.5–5.1)
Sodium: 140 mmol/L (ref 135–145)
Total Bilirubin: 0.8 mg/dL (ref 0.3–1.2)
Total Protein: 6.1 g/dL — ABNORMAL LOW (ref 6.5–8.1)

## 2023-02-26 MED ORDER — DIPHENHYDRAMINE HCL 25 MG PO CAPS
25.0000 mg | ORAL_CAPSULE | Freq: Once | ORAL | Status: AC
  Administered 2023-02-26: 25 mg via ORAL
  Filled 2023-02-26: qty 1

## 2023-02-26 MED ORDER — ACETAMINOPHEN 325 MG PO TABS
650.0000 mg | ORAL_TABLET | Freq: Once | ORAL | Status: AC
  Administered 2023-02-26: 650 mg via ORAL
  Filled 2023-02-26: qty 2

## 2023-02-26 MED ORDER — BORTEZOMIB CHEMO SQ INJECTION 3.5 MG (2.5MG/ML)
1.3000 mg/m2 | Freq: Once | INTRAMUSCULAR | Status: AC
  Administered 2023-02-26: 3 mg via SUBCUTANEOUS
  Filled 2023-02-26: qty 1.2

## 2023-02-26 MED ORDER — DEXAMETHASONE 4 MG PO TABS
10.0000 mg | ORAL_TABLET | Freq: Once | ORAL | Status: AC
  Administered 2023-02-26: 10 mg via ORAL
  Filled 2023-02-26: qty 3

## 2023-02-26 MED ORDER — DARATUMUMAB-HYALURONIDASE-FIHJ 1800-30000 MG-UT/15ML ~~LOC~~ SOLN
1800.0000 mg | Freq: Once | SUBCUTANEOUS | Status: AC
  Administered 2023-02-26: 1800 mg via SUBCUTANEOUS
  Filled 2023-02-26: qty 15

## 2023-02-26 MED ORDER — LENALIDOMIDE 10 MG PO CAPS: 10.0000 mg | ORAL_CAPSULE | Freq: Every day | ORAL | 0 refills | Status: DC

## 2023-02-26 NOTE — Progress Notes (Signed)
[facility] OFFICE PROGRESS NOTE   Diagnosis: Multiple myeloma  INTERVAL HISTORY:   [name] returns as scheduled.  He began other cycle of systemic therapy on 01/29/2023.  He developed a rash over the trunk and extremities during the week of 02/15/2023.  Revlimid was discontinued 02/17/2023.  The rash resolved over the next several days.  The rash was pruritic. He reports mild intermittent abdominal soreness, especially in the right lower abdomen.  No nausea/vomiting or diarrhea.  Numbness at the left side of the face and chin has resolved.  Objective:  Vital signs in last 24 hours:  Blood pressure 139/73, pulse 65, temperature 98.2 F (36.8 C), temperature source Oral, resp. rate 18, height 5\' 10"  (1.778 m), weight 246 lb 4.8 oz (111.7 kg), SpO2 98%.    HEENT: No thrush or ulcers Resp: Lungs clear bilaterally Cardio: Regular rate and rhythm GI: Soft, mild tenderness in the right lower abdomen, no mass, no hepatosplenomegaly, mild induration at abdominal wall injection sites Vascular: No leg edema  Skin: No rash  Portacath/PICC-without erythema  Lab Results:  Lab Results  Component Value Date   WBC 6.3 02/26/2023   HGB 13.0 02/26/2023   HCT 39.5 02/26/2023   MCV 91.4 02/26/2023   PLT 191 02/26/2023   NEUTROABS 3.7 02/26/2023    CMP  Lab Results  Component Value Date   NA 140 02/26/2023   K 3.8 02/26/2023   CL 107 02/26/2023   CO2 26 02/26/2023   GLUCOSE 140 (H) 02/26/2023   BUN 18 02/26/2023   CREATININE 0.80 02/26/2023   CALCIUM 8.7 (L) 02/26/2023   PROT 6.1 (L) 02/26/2023   ALBUMIN 4.0 02/26/2023   AST 11 (L) 02/26/2023   ALT 11 02/26/2023   ALKPHOS 58 02/26/2023   BILITOT 0.8 02/26/2023   GFRNONAA >60 02/26/2023   GFRAA >90 08/02/2012     Medications: I have reviewed the patient's current medications.   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 numerous enhancing lesions of the skull base and calvarium no evidence of extraosseous component 12/17/2022-MRI of the cervical, thoracic, and lumbar spine-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, clivus, bilateral occipital condyles, ribs, sacrum, and iliac bone, no evidence of pathologic fracture, expansile lesion in C3 extends into the spinal canal by 4 mm with moderate to severe spinal canal stenosis posterior to C3, mild right foraminal narrowing at T2-T3 and T3-4 related to osseous tumor at T3 12/17/2022-SPEP-2.6 g serum M spike, IgG kappa Bone marrow biopsy 12/18/2022-multiple myeloma with plasma cells involving 60% of the cellular marrow, kappa restricted myeloma FISH panel-QNS Bone survey 12/18/2022-lucencies at the skull, right scapula, left clavicle, lumbar spine, pelvis, and left femoral neck.  Destructive lesion at C3, possible sclerotic lucent lesions in the thoracic spine 12/18/2022 pelvic radiation to C3 and humeral head-12/31/2022 Cycle 1 daratumumab-VRD 01/01/2023 (Velcade 3 weeks on/1 week off) Cycle 2 daratumumab-VRD 01/29/2023, Revlimid discontinued after 19 days due to a pruritic rash Cycle 3 daratumumab-VRD 02/26/2023, Revlimid dose reduced to 10 mg   2.  Left shoulder pain-likely secondary to a lytic lesion at the left humeral head/neck 3.  Left chin/distal mandible numbness-likely secondary to myeloma compressing nerve roots at the brainstem or  left face 4.  Hypertension 5.  Diabetes 6.  Sleep apnea 7.  Nephrolithiasis-right ureteral calculus urinary tract infection, placement of a right ureter stent right stone dislodgment 10/01/2022 8.   Asthma   Disposition: [name] has completed 2 cycles of daratumumab-VRD.  Cycle 2 was complicated by development of a pruritic rash, likely secondary to Revlimid.  The rash resolved over a few days after discontinuing Revlimid.  The plan is to dose reduce Revlimid with this cycle.  He will call for a recurrent rash.  He will complete cycle 3 beginning today.  He will receive Zometa in 1 week.  He continues follow-up with Dr. Lovell Sheehan for management of the C3 lesion.  We will follow-up on the myeloma panel from today.  He will return for an office visit in 4 weeks.  Thornton Papas, MD  02/26/2023  9:42 AM

## 2023-02-26 NOTE — Progress Notes (Signed)
Patient seen by Dr. Sherrill today ? ?Vitals are within treatment parameters. ? ?Labs reviewed by Dr. Sherrill and are within treatment parameters. ? ?Per physician team, patient is ready for treatment and there are NO modifications to the treatment plan.  ?

## 2023-02-26 NOTE — Patient Instructions (Signed)
[facility]   Discharge Instructions: Thank you for choosing [facility] to provide your oncology and hematology care.   If you have a lab appointment with the Cancer Center, please go directly to the Cancer Center and check in at the registration area.   Wear comfortable clothing and clothing appropriate for easy access to any Portacath or PICC line.   We strive to give you quality time with your provider. You may need to reschedule your appointment if you arrive late (15 or more minutes).  Arriving late affects you and other patients whose appointments are after yours.  Also, if you miss three or more appointments without notifying the office, you may be dismissed from the clinic at the provider's discretion.      For prescription refill requests, have your pharmacy contact our office and allow 72 hours for refills to be completed.    Today you received the following chemotherapy and/or immunotherapy agents Bortezomib (VELCADE) & Daratumumab-hyaluronidase-fihj (DARZALEX FASPRO).      To help prevent nausea and vomiting after your treatment, we encourage you to take your nausea medication as directed.  BELOW ARE SYMPTOMS THAT SHOULD BE REPORTED IMMEDIATELY: *FEVER GREATER THAN 100.4 F (38 C) OR HIGHER *CHILLS OR SWEATING *NAUSEA AND VOMITING THAT IS NOT CONTROLLED WITH YOUR NAUSEA MEDICATION *UNUSUAL SHORTNESS OF BREATH *UNUSUAL BRUISING OR BLEEDING *URINARY PROBLEMS (pain or burning when urinating, or frequent urination) *BOWEL PROBLEMS (unusual diarrhea, constipation, pain near the anus) TENDERNESS IN MOUTH AND THROAT WITH OR WITHOUT PRESENCE OF ULCERS (sore throat, sores in mouth, or a toothache) UNUSUAL RASH, SWELLING OR PAIN  UNUSUAL VAGINAL DISCHARGE OR ITCHING   Items with * indicate a potential emergency and should be followed up as soon as possible or go to the Emergency Department if any problems should occur.  Please show  the CHEMOTHERAPY ALERT CARD or IMMUNOTHERAPY ALERT CARD at check-in to the Emergency Department and triage nurse.  Should you have questions after your visit or need to cancel or reschedule your appointment, please contact [facility]  Dept: [phone]  and follow the prompts.  Office hours are 8:00 a.m. to 4:30 p.m. Monday - Friday. Please note that voicemails left after 4:00 p.m. may not be returned until the following business day.  We are closed weekends and major holidays. You have access to a nurse at all times for urgent questions. Please call the main number to the clinic Dept: [phone] and follow the prompts.   For any non-urgent questions, you may also contact your provider using MyChart. We now offer e-Visits for anyone 18 and older to request care online for non-urgent symptoms. For details visit [email or web address].   Also download the MyChart app! Go to the app store, search "MyChart", open the app, select [facility], and log in with your MyChart username and password.  Bortezomib Injection What is this medication? BORTEZOMIB (bor TEZ oh mib) treats lymphoma. It may also be used to treat multiple myeloma, a type of bone marrow cancer. It works by blocking a protein that causes cancer cells to grow and multiply. This helps to slow or stop the spread of cancer cells. This medicine may be used for other purposes; ask your health care provider or pharmacist if you have questions. COMMON BRAND NAME(S): Velcade What should I tell my care team before I take this medication? They need to know if you have any of these conditions: Dehydration Diabetes Heart   disease Liver disease Tingling of the fingers or toes or other nerve disorder An unusual or allergic reaction to bortezomib, other medications, foods, dyes, or preservatives If you or your partner are pregnant or trying to get pregnant Breastfeeding How should I use this medication? This  medication is injected into a vein or under the skin. It is given by your care team in a hospital or clinic setting. Talk to your care team about the use of this medication in children. Special care may be needed. Overdosage: If you think you have taken too much of this medicine contact a poison control center or emergency room at once. NOTE: This medicine is only for you. Do not share this medicine with others. What if I miss a dose? Keep appointments for follow-up doses. It is important not to miss your dose. Call your care team if you are unable to keep an appointment. What may interact with this medication? Ketoconazole Rifampin This list may not describe all possible interactions. Give your health care provider a list of all the medicines, herbs, non-prescription drugs, or dietary supplements you use. Also tell them if you smoke, drink alcohol, or use illegal drugs. Some items may interact with your medicine. What should I watch for while using this medication? Your condition will be monitored carefully while you are receiving this medication. You may need blood work while taking this medication. This medication may affect your coordination, reaction time, or judgment. Do not drive or operate machinery until you know how this medication affects you. Sit up or stand slowly to reduce the risk of dizzy or fainting spells. Drinking alcohol with this medication can increase the risk of these side effects. This medication may increase your risk of getting an infection. Call your care team for advice if you get a fever, chills, sore throat, or other symptoms of a cold or flu. Do not treat yourself. Try to avoid being around people who are sick. Check with your care team if you have severe diarrhea, nausea, and vomiting, or if you sweat a lot. The loss of too much body fluid may make it dangerous for you to take this medication. Talk to your care team if you may be pregnant. Serious birth defects can  occur if you take this medication during pregnancy and for 7 months after the last dose. You will need a negative pregnancy test before starting this medication. Contraception is recommended while taking this medication and for 7 months after the last dose. Your care team can help you find the option that works for you. If your partner can get pregnant, use a condom during sex while taking this medication and for 4 months after the last dose. Do not breastfeed while taking this medication and for 2 months after the last dose. This medication may cause infertility. Talk to your care team if you are concerned about your fertility. What side effects may I notice from receiving this medication? Side effects that you should report to your care team as soon as possible: Allergic reactions--skin rash, itching, hives, swelling of the face, lips, tongue, or throat Bleeding--bloody or black, tar-like stools, vomiting blood or brown material that looks like coffee grounds, red or dark brown urine, small red or purple spots on skin, unusual bruising or bleeding Bleeding in the brain--severe headache, stiff neck, confusion, dizziness, change in vision, numbness or weakness of the face, arm, or leg, trouble speaking, trouble walking, vomiting Bowel blockage--stomach cramping, unable to have a bowel movement or   pass gas, loss of appetite, vomiting Heart failure--shortness of breath, swelling of the ankles, feet, or hands, sudden weight gain, unusual weakness or fatigue Infection--fever, chills, cough, sore throat, wounds that don't heal, pain or trouble when passing urine, general feeling of discomfort or being unwell Liver injury--right upper belly pain, loss of appetite, nausea, light-colored stool, dark yellow or brown urine, yellowing skin or eyes, unusual weakness or fatigue Low blood pressure--dizziness, feeling faint or lightheaded, blurry vision Lung injury--shortness of breath or trouble breathing, cough,  spitting up blood, chest pain, fever Pain, tingling, or numbness in the hands or feet Severe or prolonged diarrhea Stomach pain, bloody diarrhea, pale skin, unusual weakness or fatigue, decrease in the amount of urine, which may be signs of hemolytic uremic syndrome Sudden and severe headache, confusion, change in vision, seizures, which may be signs of posterior reversible encephalopathy syndrome (PRES) TTP--purple spots on the skin or inside the mouth, pale skin, yellowing skin or eyes, unusual weakness or fatigue, fever, fast or irregular heartbeat, confusion, change in vision, trouble speaking, trouble walking Tumor lysis syndrome (TLS)--nausea, vomiting, diarrhea, decrease in the amount of urine, dark urine, unusual weakness or fatigue, confusion, muscle pain or cramps, fast or irregular heartbeat, joint pain Side effects that usually do not require medical attention (report to your care team if they continue or are bothersome): Constipation Diarrhea Fatigue Loss of appetite Nausea This list may not describe all possible side effects. Call your doctor for medical advice about side effects. You may report side effects to FDA at 1-800-FDA-1088. Where should I keep my medication? This medication is given in a hospital or clinic. It will not be stored at home. NOTE: This sheet is a summary. It may not cover all possible information. If you have questions about this medicine, talk to your doctor, pharmacist, or health care provider.  2024 Elsevier/Gold Standard (2022-01-06 00:00:00)  Daratumumab; Hyaluronidase Injection What is this medication? DARATUMUMAB; HYALURONIDASE (dar a toom ue mab; hye al ur ON i dase) treats multiple myeloma, a type of bone marrow cancer. Daratumumab works by blocking a protein that causes cancer cells to grow and multiply. This helps to slow or stop the spread of cancer cells. Hyaluronidase works by increasing the absorption of other medications in the body to help  them work better. This medication may also be used treat amyloidosis, a condition that causes the buildup of a protein (amyloid) in your body. It works by reducing the buildup of this protein, which decreases symptoms. It is a combination medication that contains a monoclonal antibody. This medicine may be used for other purposes; ask your health care provider or pharmacist if you have questions. COMMON BRAND NAME(S): DARZALEX FASPRO What should I tell my care team before I take this medication? They need to know if you have any of these conditions: Heart disease Infection, such as chickenpox, cold sores, herpes, hepatitis B Lung or breathing disease An unusual or allergic reaction to daratumumab, hyaluronidase, other medications, foods, dyes, or preservatives Pregnant or trying to get pregnant Breast-feeding How should I use this medication? This medication is injected under the skin. It is given by your care team in a hospital or clinic setting. Talk to your care team about the use of this medication in children. Special care may be needed. Overdosage: If you think you have taken too much of this medicine contact a poison control center or emergency room at once. NOTE: This medicine is only for you. Do not share this medicine with   others. What if I miss a dose? Keep appointments for follow-up doses. It is important not to miss your dose. Call your care team if you are unable to keep an appointment. What may interact with this medication? Interactions have not been studied. This list may not describe all possible interactions. Give your health care provider a list of all the medicines, herbs, non-prescription drugs, or dietary supplements you use. Also tell them if you smoke, drink alcohol, or use illegal drugs. Some items may interact with your medicine. What should I watch for while using this medication? Your condition will be monitored carefully while you are receiving this  medication. This medication can cause serious allergic reactions. To reduce your risk, your care team may give you other medication to take before receiving this one. Be sure to follow the directions from your care team. This medication can affect the results of blood tests to match your blood type. These changes can last for up to 6 months after the final dose. Your care team will do blood tests to match your blood type before you start treatment. Tell all of your care team that you are being treated with this medication before receiving a blood transfusion. This medication can affect the results of some tests used to determine treatment response; extra tests may be needed to evaluate response. Talk to your care team if you wish to become pregnant or think you are pregnant. This medication can cause serious birth defects if taken during pregnancy and for 3 months after the last dose. A reliable form of contraception is recommended while taking this medication and for 3 months after the last dose. Talk to your care team about effective forms of contraception. Do not breast-feed while taking this medication. What side effects may I notice from receiving this medication? Side effects that you should report to your care team as soon as possible: Allergic reactions--skin rash, itching, hives, swelling of the face, lips, tongue, or throat Heart rhythm changes--fast or irregular heartbeat, dizziness, feeling faint or lightheaded, chest pain, trouble breathing Infection--fever, chills, cough, sore throat, wounds that don't heal, pain or trouble when passing urine, general feeling of discomfort or being unwell Infusion reactions--chest pain, shortness of breath or trouble breathing, feeling faint or lightheaded Sudden eye pain or change in vision such as blurry vision, seeing halos around lights, vision loss Unusual bruising or bleeding Side effects that usually do not require medical attention (report to your  care team if they continue or are bothersome): Constipation Diarrhea Fatigue Nausea Pain, tingling, or numbness in the hands or feet Swelling of the ankles, hands, or feet This list may not describe all possible side effects. Call your doctor for medical advice about side effects. You may report side effects to FDA at 1-800-FDA-1088. Where should I keep my medication? This medication is given in a hospital or clinic. It will not be stored at home. NOTE: This sheet is a summary. It may not cover all possible information. If you have questions about this medicine, talk to your doctor, pharmacist, or health care provider.  2024 Elsevier/Gold Standard (2021-12-09 00:00:00)  

## 2023-02-28 LAB — IGG: IgG (Immunoglobin G), Serum: 1137 mg/dL (ref 603–1613)

## 2023-03-01 LAB — KAPPA/LAMBDA LIGHT CHAINS
Kappa free light chain: 13.3 mg/L (ref 3.3–19.4)
Kappa, lambda light chain ratio: 1.28 (ref 0.26–1.65)
Lambda free light chains: 10.4 mg/L (ref 5.7–26.3)

## 2023-03-02 ENCOUNTER — Telehealth: Payer: Self-pay

## 2023-03-02 LAB — PROTEIN ELECTROPHORESIS, SERUM
A/G Ratio: 1.1 (ref 0.7–1.7)
Albumin ELP: 3.2 g/dL (ref 2.9–4.4)
Alpha-1-Globulin: 0.2 g/dL (ref 0.0–0.4)
Alpha-2-Globulin: 0.7 g/dL (ref 0.4–1.0)
Beta Globulin: 0.8 g/dL (ref 0.7–1.3)
Gamma Globulin: 1.1 g/dL (ref 0.4–1.8)
Globulin, Total: 2.8 g/dL (ref 2.2–3.9)
M-Spike, %: 0.8 g/dL — ABNORMAL HIGH
Total Protein ELP: 6 g/dL (ref 6.0–8.5)

## 2023-03-02 NOTE — Telephone Encounter (Signed)
The patient contacted our office to inquire about the results of their kappa light chain lab test, which showed a level of 13.3 mg/L.

## 2023-03-03 ENCOUNTER — Telehealth: Payer: Self-pay | Admitting: *Deleted

## 2023-03-03 NOTE — Telephone Encounter (Signed)
Per Dr. Truett Perna: Stop Revlimid and continue Benadryl. F/U as scheduled. He will alert nurse at next tx the status of his rash/itching. Patient aware.

## 2023-03-03 NOTE — Telephone Encounter (Signed)
Mr. Paskett reports starting reduced dose of Revlimid (10 mg ) on 03/01/23 and by next am "terrible itching" and rash has developed on his back. Taking OTC Benadryl po and topical and is very uncomfortable.

## 2023-03-04 ENCOUNTER — Encounter: Payer: Self-pay | Admitting: *Deleted

## 2023-03-05 ENCOUNTER — Inpatient Hospital Stay: Payer: Medicare Other

## 2023-03-05 ENCOUNTER — Other Ambulatory Visit: Payer: Self-pay | Admitting: *Deleted

## 2023-03-05 VITALS — BP 133/74 | HR 68 | Temp 98.2°F | Resp 18 | Ht 70.0 in | Wt 244.9 lb

## 2023-03-05 DIAGNOSIS — C9000 Multiple myeloma not having achieved remission: Secondary | ICD-10-CM

## 2023-03-05 LAB — CBC WITH DIFFERENTIAL (CANCER CENTER ONLY)
Abs Immature Granulocytes: 0.01 10*3/uL (ref 0.00–0.07)
Basophils Absolute: 0.1 10*3/uL (ref 0.0–0.1)
Basophils Relative: 2 %
Eosinophils Absolute: 0.6 10*3/uL — ABNORMAL HIGH (ref 0.0–0.5)
Eosinophils Relative: 10 %
HCT: 39.9 % (ref 39.0–52.0)
Hemoglobin: 13.3 g/dL (ref 13.0–17.0)
Immature Granulocytes: 0 %
Lymphocytes Relative: 15 %
Lymphs Abs: 0.9 10*3/uL (ref 0.7–4.0)
MCH: 30.4 pg (ref 26.0–34.0)
MCHC: 33.3 g/dL (ref 30.0–36.0)
MCV: 91.3 fL (ref 80.0–100.0)
Monocytes Absolute: 0.4 10*3/uL (ref 0.1–1.0)
Monocytes Relative: 7 %
Neutro Abs: 3.8 10*3/uL (ref 1.7–7.7)
Neutrophils Relative %: 66 %
Platelet Count: 142 10*3/uL — ABNORMAL LOW (ref 150–400)
RBC: 4.37 MIL/uL (ref 4.22–5.81)
RDW: 17.6 % — ABNORMAL HIGH (ref 11.5–15.5)
WBC Count: 5.8 10*3/uL (ref 4.0–10.5)
nRBC: 0 % (ref 0.0–0.2)

## 2023-03-05 LAB — CMP (CANCER CENTER ONLY)
ALT: 12 U/L (ref 0–44)
AST: 10 U/L — ABNORMAL LOW (ref 15–41)
Albumin: 4.1 g/dL (ref 3.5–5.0)
Alkaline Phosphatase: 50 U/L (ref 38–126)
Anion gap: 8 (ref 5–15)
BUN: 18 mg/dL (ref 8–23)
CO2: 25 mmol/L (ref 22–32)
Calcium: 9.1 mg/dL (ref 8.9–10.3)
Chloride: 105 mmol/L (ref 98–111)
Creatinine: 0.9 mg/dL (ref 0.61–1.24)
GFR, Estimated: >60 mL/min (ref >60)
Glucose, Bld: 199 mg/dL — ABNORMAL HIGH (ref 70–99)
Potassium: 4.1 mmol/L (ref 3.5–5.1)
Sodium: 138 mmol/L (ref 135–145)
Total Bilirubin: 0.6 mg/dL (ref 0.3–1.2)
Total Protein: 6.5 g/dL (ref 6.5–8.1)

## 2023-03-05 MED ORDER — BORTEZOMIB CHEMO SQ INJECTION 3.5 MG (2.5MG/ML)
1.3000 mg/m2 | Freq: Once | INTRAMUSCULAR | Status: AC
  Administered 2023-03-05: 3 mg via SUBCUTANEOUS
  Filled 2023-03-05: qty 1.2

## 2023-03-05 MED ORDER — HYDROXYZINE HCL 10 MG PO TABS: 10.0000 mg | ORAL_TABLET | Freq: Three times a day (TID) | ORAL | 0 refills | Status: DC | PRN

## 2023-03-05 MED ORDER — SODIUM CHLORIDE 0.9 % IV SOLN: Freq: Once | INTRAVENOUS | Status: AC

## 2023-03-05 MED ORDER — DEXAMETHASONE 4 MG PO TABS
10.0000 mg | ORAL_TABLET | Freq: Once | ORAL | Status: AC
  Administered 2023-03-05: 10 mg via ORAL

## 2023-03-05 MED ORDER — ZOLEDRONIC ACID 4 MG/100ML IV SOLN
4.0000 mg | Freq: Once | INTRAVENOUS | Status: AC
  Administered 2023-03-05: 4 mg via INTRAVENOUS
  Filled 2023-03-05: qty 100

## 2023-03-05 NOTE — Telephone Encounter (Signed)
Patient reports persistent itching. Revlimid was stopped on 7/17. OK to try hydrozyzine tid prn per Dr. Truett Perna. Patient notified via MyChart.

## 2023-03-05 NOTE — Patient Instructions (Signed)
[place] [facility]   Discharge Instructions: Thank you for choosing [facility] to provide your oncology and hematology care.   If you have a lab appointment with the Cancer Center, please go directly to the Cancer Center and check in at the registration area.   Wear comfortable clothing and clothing appropriate for easy access to any Portacath or PICC line.   We strive to give you quality time with your provider. You may need to reschedule your appointment if you arrive late (15 or more minutes).  Arriving late affects you and other patients whose appointments are after yours.  Also, if you miss three or more appointments without notifying the office, you may be dismissed from the clinic at the provider's discretion.      For prescription refill requests, have your pharmacy contact our office and allow 72 hours for refills to be completed.    Today you received the following chemotherapy and/or immunotherapy agents Velcade.      To help prevent nausea and vomiting after your treatment, we encourage you to take your nausea medication as directed.  BELOW ARE SYMPTOMS THAT SHOULD BE REPORTED IMMEDIATELY: *FEVER GREATER THAN 100.4 F (38 C) OR HIGHER *CHILLS OR SWEATING *NAUSEA AND VOMITING THAT IS NOT CONTROLLED WITH YOUR NAUSEA MEDICATION *UNUSUAL SHORTNESS OF BREATH *UNUSUAL BRUISING OR BLEEDING *URINARY PROBLEMS (pain or burning when urinating, or frequent urination) *BOWEL PROBLEMS (unusual diarrhea, constipation, pain near the anus) TENDERNESS IN MOUTH AND THROAT WITH OR WITHOUT PRESENCE OF ULCERS (sore throat, sores in mouth, or a toothache) UNUSUAL RASH, SWELLING OR PAIN  UNUSUAL VAGINAL DISCHARGE OR ITCHING   Items with * indicate a potential emergency and should be followed up as soon as possible or go to the Emergency Department if any problems should occur.  Please show the CHEMOTHERAPY ALERT CARD or IMMUNOTHERAPY ALERT CARD at  check-in to the Emergency Department and triage nurse.  Should you have questions after your visit or need to cancel or reschedule your appointment, please contact [place] [facility]  Dept: [phone]  and follow the prompts.  Office hours are 8:00 a.m. to 4:30 p.m. Monday - Friday. Please note that voicemails left after 4:00 p.m. may not be returned until the following business day.  We are closed weekends and major holidays. You have access to a nurse at all times for urgent questions. Please call the main number to the clinic Dept: [phone] and follow the prompts.   For any non-urgent questions, you may also contact your provider using MyChart. We now offer e-Visits for anyone 49 and older to request care online for non-urgent symptoms. For details visit [email or web address].   Also download the MyChart app! Go to the app store, search "MyChart", open the app, select [place], and log in with your MyChart username and password.  Bortezomib Injection What is this medication? BORTEZOMIB (bor TEZ oh mib) treats lymphoma. It may also be used to treat multiple myeloma, a type of bone marrow cancer. It works by blocking a protein that causes cancer cells to grow and multiply. This helps to slow or stop the spread of cancer cells. This medicine may be used for other purposes; ask your health care provider or pharmacist if you have questions. COMMON BRAND NAME(S): Velcade What should I tell my care team before I take this medication? They need to know if you have any of these conditions: Dehydration Diabetes Heart disease Liver disease Tingling of  the fingers or toes or other nerve disorder An unusual or allergic reaction to bortezomib, other medications, foods, dyes, or preservatives If you or your partner are pregnant or trying to get pregnant Breastfeeding How should I use this medication? This medication is injected into a vein or under the skin. It is  given by your care team in a hospital or clinic setting. Talk to your care team about the use of this medication in children. Special care may be needed. Overdosage: If you think you have taken too much of this medicine contact a poison control center or emergency room at once. NOTE: This medicine is only for you. Do not share this medicine with others. What if I miss a dose? Keep appointments for follow-up doses. It is important not to miss your dose. Call your care team if you are unable to keep an appointment. What may interact with this medication? Ketoconazole Rifampin This list may not describe all possible interactions. Give your health care provider a list of all the medicines, herbs, non-prescription drugs, or dietary supplements you use. Also tell them if you smoke, drink alcohol, or use illegal drugs. Some items may interact with your medicine. What should I watch for while using this medication? Your condition will be monitored carefully while you are receiving this medication. You may need blood work while taking this medication. This medication may affect your coordination, reaction time, or judgment. Do not drive or operate machinery until you know how this medication affects you. Sit up or stand slowly to reduce the risk of dizzy or fainting spells. Drinking alcohol with this medication can increase the risk of these side effects. This medication may increase your risk of getting an infection. Call your care team for advice if you get a fever, chills, sore throat, or other symptoms of a cold or flu. Do not treat yourself. Try to avoid being around people who are sick. Check with your care team if you have severe diarrhea, nausea, and vomiting, or if you sweat a lot. The loss of too much body fluid may make it dangerous for you to take this medication. Talk to your care team if you may be pregnant. Serious birth defects can occur if you take this medication during pregnancy and for 7  months after the last dose. You will need a negative pregnancy test before starting this medication. Contraception is recommended while taking this medication and for 7 months after the last dose. Your care team can help you find the option that works for you. If your partner can get pregnant, use a condom during sex while taking this medication and for 4 months after the last dose. Do not breastfeed while taking this medication and for 2 months after the last dose. This medication may cause infertility. Talk to your care team if you are concerned about your fertility. What side effects may I notice from receiving this medication? Side effects that you should report to your care team as soon as possible: Allergic reactions--skin rash, itching, hives, swelling of the face, lips, tongue, or throat Bleeding--bloody or black, tar-like stools, vomiting blood or brown material that looks like coffee grounds, red or dark brown urine, small red or purple spots on skin, unusual bruising or bleeding Bleeding in the brain--severe headache, stiff neck, confusion, dizziness, change in vision, numbness or weakness of the face, arm, or leg, trouble speaking, trouble walking, vomiting Bowel blockage--stomach cramping, unable to have a bowel movement or pass gas, loss of appetite,  vomiting Heart failure--shortness of breath, swelling of the ankles, feet, or hands, sudden weight gain, unusual weakness or fatigue Infection--fever, chills, cough, sore throat, wounds that don't heal, pain or trouble when passing urine, general feeling of discomfort or being unwell Liver injury--right upper belly pain, loss of appetite, nausea, light-colored stool, dark yellow or brown urine, yellowing skin or eyes, unusual weakness or fatigue Low blood pressure--dizziness, feeling faint or lightheaded, blurry vision Lung injury--shortness of breath or trouble breathing, cough, spitting up blood, chest pain, fever Pain, tingling, or  numbness in the hands or feet Severe or prolonged diarrhea Stomach pain, bloody diarrhea, pale skin, unusual weakness or fatigue, decrease in the amount of urine, which may be signs of hemolytic uremic syndrome Sudden and severe headache, confusion, change in vision, seizures, which may be signs of posterior reversible encephalopathy syndrome (PRES) TTP--purple spots on the skin or inside the mouth, pale skin, yellowing skin or eyes, unusual weakness or fatigue, fever, fast or irregular heartbeat, confusion, change in vision, trouble speaking, trouble walking Tumor lysis syndrome (TLS)--nausea, vomiting, diarrhea, decrease in the amount of urine, dark urine, unusual weakness or fatigue, confusion, muscle pain or cramps, fast or irregular heartbeat, joint pain Side effects that usually do not require medical attention (report to your care team if they continue or are bothersome): Constipation Diarrhea Fatigue Loss of appetite Nausea This list may not describe all possible side effects. Call your doctor for medical advice about side effects. You may report side effects to FDA at 1-800-FDA-1088. Where should I keep my medication? This medication is given in a hospital or clinic. It will not be stored at home. NOTE: This sheet is a summary. It may not cover all possible information. If you have questions about this medicine, talk to your doctor, pharmacist, or health care provider.  2024 Elsevier/Gold Standard (2022-01-06 00:00:00) Zoledronic Acid Injection (Cancer) What is this medication? ZOLEDRONIC ACID (ZOE le dron ik AS id) treats high calcium levels in the blood caused by cancer. It may also be used with chemotherapy to treat weakened bones caused by cancer. It works by slowing down the release of calcium from bones. This lowers calcium levels in your blood. It also makes your bones stronger and less likely to break (fracture). It belongs to a group of medications called bisphosphonates. This  medicine may be used for other purposes; ask your health care provider or pharmacist if you have questions. COMMON BRAND NAME(S): Zometa, Zometa Powder What should I tell my care team before I take this medication? They need to know if you have any of these conditions: Dehydration Dental disease Kidney disease Liver disease Low levels of calcium in the blood Lung or breathing disease, such as asthma Receiving steroids, such as dexamethasone or prednisone An unusual or allergic reaction to zoledronic acid, other medications, foods, dyes, or preservatives Pregnant or trying to get pregnant Breast-feeding How should I use this medication? This medication is injected into a vein. It is given by your care team in a hospital or clinic setting. Talk to your care team about the use of this medication in children. Special care may be needed. Overdosage: If you think you have taken too much of this medicine contact a poison control center or emergency room at once. NOTE: This medicine is only for you. Do not share this medicine with others. What if I miss a dose? Keep appointments for follow-up doses. It is important not to miss your dose. Call your care team if you are  unable to keep an appointment. What may interact with this medication? Certain antibiotics given by injection Diuretics, such as bumetanide, furosemide NSAIDs, medications for pain and inflammation, such as ibuprofen or naproxen Teriparatide Thalidomide This list may not describe all possible interactions. Give your health care provider a list of all the medicines, herbs, non-prescription drugs, or dietary supplements you use. Also tell them if you smoke, drink alcohol, or use illegal drugs. Some items may interact with your medicine. What should I watch for while using this medication? Visit your care team for regular checks on your progress. It may be some time before you see the benefit from this medication. Some people who  take this medication have severe bone, joint, or muscle pain. This medication may also increase your risk for jaw problems or a broken thigh bone. Tell your care team right away if you have severe pain in your jaw, bones, joints, or muscles. Tell you care team if you have any pain that does not go away or that gets worse. Tell your dentist and dental surgeon that you are taking this medication. You should not have major dental surgery while on this medication. See your dentist to have a dental exam and fix any dental problems before starting this medication. Take good care of your teeth while on this medication. Make sure you see your dentist for regular follow-up appointments. You should make sure you get enough calcium and vitamin D while you are taking this medication. Discuss the foods you eat and the vitamins you take with your care team. Check with your care team if you have severe diarrhea, nausea, and vomiting, or if you sweat a lot. The loss of too much body fluid may make it dangerous for you to take this medication. You may need bloodwork while taking this medication. Talk to your care team if you wish to become pregnant or think you might be pregnant. This medication can cause serious birth defects. What side effects may I notice from receiving this medication? Side effects that you should report to your care team as soon as possible: Allergic reactions--skin rash, itching, hives, swelling of the face, lips, tongue, or throat Kidney injury--decrease in the amount of urine, swelling of the ankles, hands, or feet Low calcium level--muscle pain or cramps, confusion, tingling, or numbness in the hands or feet Osteonecrosis of the jaw--pain, swelling, or redness in the mouth, numbness of the jaw, poor healing after dental work, unusual discharge from the mouth, visible bones in the mouth Severe bone, joint, or muscle pain Side effects that usually do not require medical attention (report to your  care team if they continue or are bothersome): Constipation Fatigue Fever Loss of appetite Nausea Stomach pain This list may not describe all possible side effects. Call your doctor for medical advice about side effects. You may report side effects to FDA at 1-800-FDA-1088. Where should I keep my medication? This medication is given in a hospital or clinic. It will not be stored at home. NOTE: This sheet is a summary. It may not cover all possible information. If you have questions about this medicine, talk to your doctor, pharmacist, or health care provider.  2024 Elsevier/Gold Standard (2021-09-26 00:00:00)

## 2023-03-12 ENCOUNTER — Encounter: Payer: Self-pay | Admitting: Oncology

## 2023-03-12 ENCOUNTER — Inpatient Hospital Stay: Payer: Medicare Other

## 2023-03-12 VITALS — BP 154/81 | HR 66 | Temp 97.6°F | Resp 18

## 2023-03-12 DIAGNOSIS — C9000 Multiple myeloma not having achieved remission: Secondary | ICD-10-CM | POA: Diagnosis not present

## 2023-03-12 LAB — CMP (CANCER CENTER ONLY)
ALT: 9 U/L (ref 0–44)
AST: 10 U/L — ABNORMAL LOW (ref 15–41)
Albumin: 4 g/dL (ref 3.5–5.0)
Alkaline Phosphatase: 51 U/L (ref 38–126)
Anion gap: 6 (ref 5–15)
BUN: 20 mg/dL (ref 8–23)
CO2: 27 mmol/L (ref 22–32)
Calcium: 9 mg/dL (ref 8.9–10.3)
Chloride: 107 mmol/L (ref 98–111)
Creatinine: 0.82 mg/dL (ref 0.61–1.24)
GFR, Estimated: >60 mL/min (ref >60)
Glucose, Bld: 143 mg/dL — ABNORMAL HIGH (ref 70–99)
Potassium: 4.2 mmol/L (ref 3.5–5.1)
Sodium: 140 mmol/L (ref 135–145)
Total Bilirubin: 0.5 mg/dL (ref 0.3–1.2)
Total Protein: 6.6 g/dL (ref 6.5–8.1)

## 2023-03-12 LAB — CBC WITH DIFFERENTIAL (CANCER CENTER ONLY)
Abs Immature Granulocytes: 0.03 10*3/uL (ref 0.00–0.07)
Basophils Absolute: 0.1 10*3/uL (ref 0.0–0.1)
Basophils Relative: 1 %
Eosinophils Absolute: 0.3 10*3/uL (ref 0.0–0.5)
Eosinophils Relative: 3 %
HCT: 41.6 % (ref 39.0–52.0)
Hemoglobin: 13.7 g/dL (ref 13.0–17.0)
Immature Granulocytes: 0 %
Lymphocytes Relative: 18 %
Lymphs Abs: 1.4 10*3/uL (ref 0.7–4.0)
MCH: 30.3 pg (ref 26.0–34.0)
MCHC: 32.9 g/dL (ref 30.0–36.0)
MCV: 92 fL (ref 80.0–100.0)
Monocytes Absolute: 0.8 10*3/uL (ref 0.1–1.0)
Monocytes Relative: 10 %
Neutro Abs: 5.4 10*3/uL (ref 1.7–7.7)
Neutrophils Relative %: 68 %
Platelet Count: 142 10*3/uL — ABNORMAL LOW (ref 150–400)
RBC: 4.52 MIL/uL (ref 4.22–5.81)
RDW: 17.2 % — ABNORMAL HIGH (ref 11.5–15.5)
WBC Count: 8 10*3/uL (ref 4.0–10.5)
nRBC: 0 % (ref 0.0–0.2)

## 2023-03-12 MED ORDER — ACETAMINOPHEN 325 MG PO TABS
650.0000 mg | ORAL_TABLET | Freq: Once | ORAL | Status: AC
  Administered 2023-03-12: 650 mg via ORAL
  Filled 2023-03-12: qty 2

## 2023-03-12 MED ORDER — DEXAMETHASONE 4 MG PO TABS
10.0000 mg | ORAL_TABLET | Freq: Once | ORAL | Status: AC
  Administered 2023-03-12: 10 mg via ORAL
  Filled 2023-03-12: qty 3

## 2023-03-12 MED ORDER — DARATUMUMAB-HYALURONIDASE-FIHJ 1800-30000 MG-UT/15ML ~~LOC~~ SOLN
1800.0000 mg | Freq: Once | SUBCUTANEOUS | Status: AC
  Administered 2023-03-12: 1800 mg via SUBCUTANEOUS
  Filled 2023-03-12: qty 15

## 2023-03-12 MED ORDER — DIPHENHYDRAMINE HCL 25 MG PO CAPS
25.0000 mg | ORAL_CAPSULE | Freq: Once | ORAL | Status: AC
  Administered 2023-03-12: 25 mg via ORAL
  Filled 2023-03-12: qty 1

## 2023-03-12 MED ORDER — BORTEZOMIB CHEMO SQ INJECTION 3.5 MG (2.5MG/ML)
1.3000 mg/m2 | Freq: Once | INTRAMUSCULAR | Status: AC
  Administered 2023-03-12: 3 mg via SUBCUTANEOUS
  Filled 2023-03-12: qty 1.2

## 2023-03-12 NOTE — Progress Notes (Signed)
Patient had questions/concerns regarding his Revlimid medication. This Clinical research associate clarified with him per all the notes on file that the Revlimid is supposed to be on hold due to his rash. Patient confirmed he has not been taking it, however, he would like to know what the plan going forward is for it. He is strongly requested to speak with Dr. Truett Perna today about this. He is aware that next office visit on 03/26/23 with Santiago Glad, NP. Concerns related to Darl Pikes, Charity fundraiser.  Per Darl Pikes: MD does not have time to come back today. His plans are to reach out to Dr. Melrose Nakayama at [facility] to see if he has any thoughts on what to take in place of the Revlimid. Continue Velcade/Dara for now. Still early in his treatment (has only had 3 cycles). Dr. Truett Perna and Misty Stanley are both out of the office on 8/9, so if he wants treatment that day he needs to see her. Make sure he knows she is very experienced and has worked with Dr. Truett Perna before.   Patient verbalized understanding and was in agreement with this.   This Clinical research associate also explained to patient that the reason he has no treatment appointment next week is because his cycle is velcade D1,8,15 q 28 days. today is day 15 of Cycle 3, so no treatment next week as he is getting DARA every two weeks now.

## 2023-03-12 NOTE — Patient Instructions (Signed)
[facility]   Discharge Instructions: Thank you for choosing [facility] to provide your oncology and hematology care.   If you have a lab appointment with the Cancer Center, please go directly to the Cancer Center and check in at the registration area.   Wear comfortable clothing and clothing appropriate for easy access to any Portacath or PICC line.   We strive to give you quality time with your provider. You may need to reschedule your appointment if you arrive late (15 or more minutes).  Arriving late affects you and other patients whose appointments are after yours.  Also, if you miss three or more appointments without notifying the office, you may be dismissed from the clinic at the provider's discretion.      For prescription refill requests, have your pharmacy contact our office and allow 72 hours for refills to be completed.    Today you received the following chemotherapy and/or immunotherapy agents Bortezomib (VELCADE) & Daratumumab-hyaluronidase-fihj (DARZALEX FASPRO).      To help prevent nausea and vomiting after your treatment, we encourage you to take your nausea medication as directed.  BELOW ARE SYMPTOMS THAT SHOULD BE REPORTED IMMEDIATELY: *FEVER GREATER THAN 100.4 F (38 C) OR HIGHER *CHILLS OR SWEATING *NAUSEA AND VOMITING THAT IS NOT CONTROLLED WITH YOUR NAUSEA MEDICATION *UNUSUAL SHORTNESS OF BREATH *UNUSUAL BRUISING OR BLEEDING *URINARY PROBLEMS (pain or burning when urinating, or frequent urination) *BOWEL PROBLEMS (unusual diarrhea, constipation, pain near the anus) TENDERNESS IN MOUTH AND THROAT WITH OR WITHOUT PRESENCE OF ULCERS (sore throat, sores in mouth, or a toothache) UNUSUAL RASH, SWELLING OR PAIN  UNUSUAL VAGINAL DISCHARGE OR ITCHING   Items with * indicate a potential emergency and should be followed up as soon as possible or go to the Emergency Department if any problems should occur.  Please show  the CHEMOTHERAPY ALERT CARD or IMMUNOTHERAPY ALERT CARD at check-in to the Emergency Department and triage nurse.  Should you have questions after your visit or need to cancel or reschedule your appointment, please contact [facility]  Dept: [phone]  and follow the prompts.  Office hours are 8:00 a.m. to 4:30 p.m. Monday - Friday. Please note that voicemails left after 4:00 p.m. may not be returned until the following business day.  We are closed weekends and major holidays. You have access to a nurse at all times for urgent questions. Please call the main number to the clinic Dept: [phone] and follow the prompts.   For any non-urgent questions, you may also contact your provider using MyChart. We now offer e-Visits for anyone 18 and older to request care online for non-urgent symptoms. For details visit [email or web address].   Also download the MyChart app! Go to the app store, search "MyChart", open the app, select [facility], and log in with your MyChart username and password.  Bortezomib Injection What is this medication? BORTEZOMIB (bor TEZ oh mib) treats lymphoma. It may also be used to treat multiple myeloma, a type of bone marrow cancer. It works by blocking a protein that causes cancer cells to grow and multiply. This helps to slow or stop the spread of cancer cells. This medicine may be used for other purposes; ask your health care provider or pharmacist if you have questions. COMMON BRAND NAME(S): Velcade What should I tell my care team before I take this medication? They need to know if you have any of these conditions: Dehydration Diabetes Heart   disease Liver disease Tingling of the fingers or toes or other nerve disorder An unusual or allergic reaction to bortezomib, other medications, foods, dyes, or preservatives If you or your partner are pregnant or trying to get pregnant Breastfeeding How should I use this medication? This  medication is injected into a vein or under the skin. It is given by your care team in a hospital or clinic setting. Talk to your care team about the use of this medication in children. Special care may be needed. Overdosage: If you think you have taken too much of this medicine contact a poison control center or emergency room at once. NOTE: This medicine is only for you. Do not share this medicine with others. What if I miss a dose? Keep appointments for follow-up doses. It is important not to miss your dose. Call your care team if you are unable to keep an appointment. What may interact with this medication? Ketoconazole Rifampin This list may not describe all possible interactions. Give your health care provider a list of all the medicines, herbs, non-prescription drugs, or dietary supplements you use. Also tell them if you smoke, drink alcohol, or use illegal drugs. Some items may interact with your medicine. What should I watch for while using this medication? Your condition will be monitored carefully while you are receiving this medication. You may need blood work while taking this medication. This medication may affect your coordination, reaction time, or judgment. Do not drive or operate machinery until you know how this medication affects you. Sit up or stand slowly to reduce the risk of dizzy or fainting spells. Drinking alcohol with this medication can increase the risk of these side effects. This medication may increase your risk of getting an infection. Call your care team for advice if you get a fever, chills, sore throat, or other symptoms of a cold or flu. Do not treat yourself. Try to avoid being around people who are sick. Check with your care team if you have severe diarrhea, nausea, and vomiting, or if you sweat a lot. The loss of too much body fluid may make it dangerous for you to take this medication. Talk to your care team if you may be pregnant. Serious birth defects can  occur if you take this medication during pregnancy and for 7 months after the last dose. You will need a negative pregnancy test before starting this medication. Contraception is recommended while taking this medication and for 7 months after the last dose. Your care team can help you find the option that works for you. If your partner can get pregnant, use a condom during sex while taking this medication and for 4 months after the last dose. Do not breastfeed while taking this medication and for 2 months after the last dose. This medication may cause infertility. Talk to your care team if you are concerned about your fertility. What side effects may I notice from receiving this medication? Side effects that you should report to your care team as soon as possible: Allergic reactions--skin rash, itching, hives, swelling of the face, lips, tongue, or throat Bleeding--bloody or black, tar-like stools, vomiting blood or brown material that looks like coffee grounds, red or dark brown urine, small red or purple spots on skin, unusual bruising or bleeding Bleeding in the brain--severe headache, stiff neck, confusion, dizziness, change in vision, numbness or weakness of the face, arm, or leg, trouble speaking, trouble walking, vomiting Bowel blockage--stomach cramping, unable to have a bowel movement or   pass gas, loss of appetite, vomiting Heart failure--shortness of breath, swelling of the ankles, feet, or hands, sudden weight gain, unusual weakness or fatigue Infection--fever, chills, cough, sore throat, wounds that don't heal, pain or trouble when passing urine, general feeling of discomfort or being unwell Liver injury--right upper belly pain, loss of appetite, nausea, light-colored stool, dark yellow or brown urine, yellowing skin or eyes, unusual weakness or fatigue Low blood pressure--dizziness, feeling faint or lightheaded, blurry vision Lung injury--shortness of breath or trouble breathing, cough,  spitting up blood, chest pain, fever Pain, tingling, or numbness in the hands or feet Severe or prolonged diarrhea Stomach pain, bloody diarrhea, pale skin, unusual weakness or fatigue, decrease in the amount of urine, which may be signs of hemolytic uremic syndrome Sudden and severe headache, confusion, change in vision, seizures, which may be signs of posterior reversible encephalopathy syndrome (PRES) TTP--purple spots on the skin or inside the mouth, pale skin, yellowing skin or eyes, unusual weakness or fatigue, fever, fast or irregular heartbeat, confusion, change in vision, trouble speaking, trouble walking Tumor lysis syndrome (TLS)--nausea, vomiting, diarrhea, decrease in the amount of urine, dark urine, unusual weakness or fatigue, confusion, muscle pain or cramps, fast or irregular heartbeat, joint pain Side effects that usually do not require medical attention (report to your care team if they continue or are bothersome): Constipation Diarrhea Fatigue Loss of appetite Nausea This list may not describe all possible side effects. Call your doctor for medical advice about side effects. You may report side effects to FDA at 1-800-FDA-1088. Where should I keep my medication? This medication is given in a hospital or clinic. It will not be stored at home. NOTE: This sheet is a summary. It may not cover all possible information. If you have questions about this medicine, talk to your doctor, pharmacist, or health care provider.  2024 Elsevier/Gold Standard (2022-01-06 00:00:00)  Daratumumab; Hyaluronidase Injection What is this medication? DARATUMUMAB; HYALURONIDASE (dar a toom ue mab; hye al ur ON i dase) treats multiple myeloma, a type of bone marrow cancer. Daratumumab works by blocking a protein that causes cancer cells to grow and multiply. This helps to slow or stop the spread of cancer cells. Hyaluronidase works by increasing the absorption of other medications in the body to help  them work better. This medication may also be used treat amyloidosis, a condition that causes the buildup of a protein (amyloid) in your body. It works by reducing the buildup of this protein, which decreases symptoms. It is a combination medication that contains a monoclonal antibody. This medicine may be used for other purposes; ask your health care provider or pharmacist if you have questions. COMMON BRAND NAME(S): DARZALEX FASPRO What should I tell my care team before I take this medication? They need to know if you have any of these conditions: Heart disease Infection, such as chickenpox, cold sores, herpes, hepatitis B Lung or breathing disease An unusual or allergic reaction to daratumumab, hyaluronidase, other medications, foods, dyes, or preservatives Pregnant or trying to get pregnant Breast-feeding How should I use this medication? This medication is injected under the skin. It is given by your care team in a hospital or clinic setting. Talk to your care team about the use of this medication in children. Special care may be needed. Overdosage: If you think you have taken too much of this medicine contact a poison control center or emergency room at once. NOTE: This medicine is only for you. Do not share this medicine with   others. What if I miss a dose? Keep appointments for follow-up doses. It is important not to miss your dose. Call your care team if you are unable to keep an appointment. What may interact with this medication? Interactions have not been studied. This list may not describe all possible interactions. Give your health care provider a list of all the medicines, herbs, non-prescription drugs, or dietary supplements you use. Also tell them if you smoke, drink alcohol, or use illegal drugs. Some items may interact with your medicine. What should I watch for while using this medication? Your condition will be monitored carefully while you are receiving this  medication. This medication can cause serious allergic reactions. To reduce your risk, your care team may give you other medication to take before receiving this one. Be sure to follow the directions from your care team. This medication can affect the results of blood tests to match your blood type. These changes can last for up to 6 months after the final dose. Your care team will do blood tests to match your blood type before you start treatment. Tell all of your care team that you are being treated with this medication before receiving a blood transfusion. This medication can affect the results of some tests used to determine treatment response; extra tests may be needed to evaluate response. Talk to your care team if you wish to become pregnant or think you are pregnant. This medication can cause serious birth defects if taken during pregnancy and for 3 months after the last dose. A reliable form of contraception is recommended while taking this medication and for 3 months after the last dose. Talk to your care team about effective forms of contraception. Do not breast-feed while taking this medication. What side effects may I notice from receiving this medication? Side effects that you should report to your care team as soon as possible: Allergic reactions--skin rash, itching, hives, swelling of the face, lips, tongue, or throat Heart rhythm changes--fast or irregular heartbeat, dizziness, feeling faint or lightheaded, chest pain, trouble breathing Infection--fever, chills, cough, sore throat, wounds that don't heal, pain or trouble when passing urine, general feeling of discomfort or being unwell Infusion reactions--chest pain, shortness of breath or trouble breathing, feeling faint or lightheaded Sudden eye pain or change in vision such as blurry vision, seeing halos around lights, vision loss Unusual bruising or bleeding Side effects that usually do not require medical attention (report to your  care team if they continue or are bothersome): Constipation Diarrhea Fatigue Nausea Pain, tingling, or numbness in the hands or feet Swelling of the ankles, hands, or feet This list may not describe all possible side effects. Call your doctor for medical advice about side effects. You may report side effects to FDA at 1-800-FDA-1088. Where should I keep my medication? This medication is given in a hospital or clinic. It will not be stored at home. NOTE: This sheet is a summary. It may not cover all possible information. If you have questions about this medicine, talk to your doctor, pharmacist, or health care provider.  2024 Elsevier/Gold Standard (2021-12-09 00:00:00)  

## 2023-03-19 ENCOUNTER — Encounter: Payer: Self-pay | Admitting: *Deleted

## 2023-03-21 ENCOUNTER — Other Ambulatory Visit: Payer: Self-pay | Admitting: Oncology

## 2023-03-21 DIAGNOSIS — C9000 Multiple myeloma not having achieved remission: Secondary | ICD-10-CM

## 2023-03-23 ENCOUNTER — Telehealth: Payer: Self-pay | Admitting: *Deleted

## 2023-03-23 ENCOUNTER — Other Ambulatory Visit: Payer: Self-pay | Admitting: Oncology

## 2023-03-23 NOTE — Telephone Encounter (Signed)
LVM that Dr. Truett Perna is not in office on 03/26/23, but will call him tomorrow afternoon to go over treatment plan with him.

## 2023-03-24 ENCOUNTER — Other Ambulatory Visit: Payer: Self-pay | Admitting: Nurse Practitioner

## 2023-03-24 ENCOUNTER — Other Ambulatory Visit: Payer: Self-pay | Admitting: *Deleted

## 2023-03-24 ENCOUNTER — Other Ambulatory Visit: Payer: Self-pay | Admitting: Oncology

## 2023-03-24 ENCOUNTER — Other Ambulatory Visit: Payer: Self-pay

## 2023-03-24 MED ORDER — PREDNISONE 20 MG PO TABS: 40.0000 mg | ORAL_TABLET | ORAL | 1 refills | Status: DC

## 2023-03-24 NOTE — Progress Notes (Signed)
Dr. Truett Perna called [facility] yesterday and discussed new treatment plan. Patient has #18 of Revlimid 10 mg tablets on hand-per Dr. Truett Perna, this next cycle will only need to take #18 days and will reorder full amount with next cycle. Sent script for MWF Prednisone to pharmacy. Dr. Truett Perna instructed him to monitor his glucose while on prednisone and call for reading > 300. Care plan updated to remove dexamethasone and added Zofran as premed.

## 2023-03-25 NOTE — Progress Notes (Signed)
Patient Care Team: Pcp, No as PCP - General   CHIEF COMPLAINT: Follow up multiple myeloma   CURRENT THERAPY: Daratumumab-VRD + Zometa  INTERVAL HISTORY Mr. Erlich returns for follow up as scheduled. Last seen by Dr. Truett Perna with cycle 3 starting 02/26/23.  He continued to have an itching rash mainly on his buttocks, back, and arms.  He has been off Revlimid for 2 weeks, rash resolved quickly.   Eating and drinking, managing constipation with MiraLAX which is partially effective. Denies nausea/vomiting.  Still has some discomfort in the right abdomen and soreness over his belly.  Bottoms of his feet feel "weird" without overt numbness or tingling.  Blood sugars have been good at home low 100s.  Energy is fair, better after he takes steroids.  The left shoulder pain that initially improved after radiation is slightly more noticeable in the past couple weeks, has bilateral shoulder aches at times, but manageable and pain level fluctuates.  He takes Tylenol when needed.  ROS  All other systems reviewed and negative  Past Medical History:  Diagnosis Date   Arthritis    Asthma    BMI 40.0-44.9, adult (HCC) 08/03/2012   Diabetes mellitus type 2 in obese 08/03/2012   Diabetes mellitus without complication (HCC)    History of kidney stones 08/18/2003   Hypertension    Hypertension 08/03/2012   Kidney stone    Sleep apnea    not using CPAP - could not tolerate     Past Surgical History:  Procedure Laterality Date   CHOLECYSTECTOMY  08/02/2012   Procedure: LAPAROSCOPIC CHOLECYSTECTOMY WITH INTRAOPERATIVE CHOLANGIOGRAM;  Surgeon: [name], MD;  Location: [facility] OR;  Service: General;  Laterality: N/A;  laparoscopic cholecystectomy with intraoperative cholangiogram   KNEE ARTHROSCOPY     LITHOTRIPSY     SHOULDER ARTHROSCOPY WITH SUBACROMIAL DECOMPRESSION  07/07/2012   Procedure: SHOULDER ARTHROSCOPY WITH SUBACROMIAL DECOMPRESSION;  Surgeon: [name], MD;  Location: [facility] OR;   Service: Orthopedics;  Laterality: Right;  with distal clavicle resection     Outpatient Encounter Medications as of 03/26/2023  Medication Sig Note   acetaminophen (TYLENOL) 325 MG tablet Take 2 tablets (650 mg total) by mouth every 6 (six) hours as needed (or Temp > 100).    acyclovir (ZOVIRAX) 400 MG tablet Take 1 tablet (400 mg total) by mouth 2 (two) times daily.    ALPRAZolam (XANAX) 0.5 MG tablet Take 1 tablet (0.5 mg total) by mouth 2 (two) times daily as needed for anxiety. Do not drive while taking    amLODipine (NORVASC) 5 MG tablet Take 5 mg by mouth daily.    aspirin EC 81 MG tablet Take 81 mg by mouth daily. Swallow whole.    cyanocobalamin (VITAMIN B12) 1000 MCG/ML injection Inject 1,000 mcg into the muscle. At PCP office    diphenhydrAMINE (BENADRYL) 25 mg capsule Take 25-50 mg by mouth every 6 (six) hours as needed for itching.    glipiZIDE (GLUCOTROL XL) 10 MG 24 hr tablet Take 10 mg by mouth daily.    hydrOXYzine (ATARAX) 10 MG tablet Take 1 tablet (10 mg total) by mouth 3 (three) times daily as needed.    JANUVIA 100 MG tablet Take 100 mg by mouth daily.    losartan (COZAAR) 100 MG tablet Take 100 mg by mouth daily.    metFORMIN (GLUCOPHAGE) 500 MG tablet Take 1 tablet (500 mg total) by mouth daily. 12/24/2022: Takes 1000 mg daily   pantoprazole (PROTONIX) 20 MG  tablet Take 1 tablet (20 mg total) by mouth daily. 02/26/2023: Takes prn   polyethylene glycol (MIRALAX / GLYCOLAX) 17 g packet Take 17 g by mouth daily.    predniSONE (DELTASONE) 20 MG tablet Take 2 tablets (40 mg total) by mouth every Monday, Wednesday, and Friday.    senna (SENOKOT) 8.6 MG TABS tablet Take 1 tablet (8.6 mg total) by mouth 2 (two) times daily.    albuterol (PROVENTIL) (2.5 MG/3ML) 0.083% nebulizer solution Inhale 2.5 mg into the lungs every 6 (six) hours as needed for shortness of breath. (Patient not taking: Reported on 12/24/2022)    albuterol (VENTOLIN HFA) 108 (90 Base) MCG/ACT inhaler Inhale 1 puff  into the lungs every 6 (six) hours as needed for shortness of breath. (Patient not taking: Reported on 12/24/2022)    cyclobenzaprine (FLEXERIL) 10 MG tablet Take 10 mg by mouth 3 (three) times daily as needed for muscle spasms. (Patient not taking: Reported on 01/15/2023)    lenalidomide (REVLIMID) 10 MG capsule Take 1 capsule (10 mg total) by mouth daily. Take 1 capsule by mouth daily x 21 days on, then 7 day rest. Repeat every 28 days (Patient not taking: Reported on 03/26/2023)    oxyCODONE (ROXICODONE) 5 MG immediate release tablet Take 1 tablet (5 mg total) by mouth every 4 (four) hours as needed for severe pain. (Patient not taking: Reported on 12/24/2022)    prochlorperazine (COMPAZINE) 10 MG tablet Take 1 tablet (10 mg total) by mouth every 6 (six) hours as needed. (Patient not taking: Reported on 02/26/2023)    No facility-administered encounter medications on file as of 03/26/2023.     Today's Vitals   03/26/23 0900 03/26/23 0902  BP:  131/65  Pulse:  67  Resp:  18  Temp:  98.1 F (36.7 C)  TempSrc:  Oral  SpO2:  98%  Weight:  249 lb (112.9 kg)  Height:  5\' 10"  (1.778 m)  PainSc: 7     Body mass index is 35.73 kg/m.   PHYSICAL EXAM GENERAL:alert, no distress and comfortable SKIN: no rash  EYES: sclera clear LUNGS: clear with normal breathing effort HEART: regular rate & rhythm, no lower extremity edema ABDOMEN: abdomen soft, non-tender and normal bowel sounds NEURO: alert & oriented x 3 with fluent speech, no focal motor deficits MSK: no ttp    CBC    Component Value Date/Time   WBC 6.5 03/26/2023 0853   WBC 6.7 12/18/2022 0548   RBC 4.31 03/26/2023 0853   HGB 13.4 03/26/2023 0853   HCT 39.9 03/26/2023 0853   PLT 185 03/26/2023 0853   MCV 92.6 03/26/2023 0853   MCH 31.1 03/26/2023 0853   MCHC 33.6 03/26/2023 0853   RDW 16.5 (H) 03/26/2023 0853   LYMPHSABS 1.2 03/26/2023 0853   MONOABS 0.7 03/26/2023 0853   EOSABS 0.4 03/26/2023 0853   BASOSABS 0.0 03/26/2023 0853      CMP     Component Value Date/Time   NA 140 03/26/2023 0853   K 4.1 03/26/2023 0853   CL 107 03/26/2023 0853   CO2 26 03/26/2023 0853   GLUCOSE 103 (H) 03/26/2023 0853   BUN 20 03/26/2023 0853   CREATININE 0.78 03/26/2023 0853   CALCIUM 9.1 03/26/2023 0853   PROT 6.5 03/26/2023 0853   ALBUMIN 4.1 03/26/2023 0853   AST 13 (L) 03/26/2023 0853   ALT 12 03/26/2023 0853   ALKPHOS 57 03/26/2023 0853   BILITOT 0.4 03/26/2023 0853   GFRNONAA >60 03/26/2023 1610  GFRAA >90 08/02/2012 0700     ASSESSMENT & PLAN: Multiple myeloma-IgG kappa 12/14/2022-SPEP-3.1 g M spike in the gamma region, 11/25/2022-x-ray left shoulder-lytic lesion in the medial aspect of the humeral head and neck with cortical breakthrough CT lumbar spine 12/10/2022-scattered lucent lesions throughout the lumbar spine 12/16/2022-CT cervical spine-expansile destructive lesion at C3 12/17/2022-MRI brain-right 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 numerous enhancing lesions of the skull base and calvarium no evidence of extraosseous component 12/17/2022-MRI of the cervical, thoracic, and lumbar spine-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, clivus, bilateral occipital condyles, ribs, sacrum, and iliac bone, no evidence of pathologic fracture, expansile lesion in C3 extends into the spinal canal by 4 mm with moderate to severe spinal canal stenosis posterior to C3, mild right foraminal narrowing at T2-T3 and T3-4 related to osseous tumor at T3 12/17/2022-SPEP-2.6 g serum M spike, IgG kappa Bone marrow biopsy 12/18/2022-multiple myeloma with plasma cells involving 60% of the cellular marrow, kappa restricted myeloma FISH panel-QNS Bone survey 12/18/2022-lucencies at the skull, right scapula, left clavicle, lumbar spine, pelvis, and left femoral neck.  Destructive lesion at C3, possible sclerotic lucent lesions in the thoracic spine 12/18/2022 pelvic radiation to C3 and humeral  head-12/31/2022 Cycle 1 daratumumab-VRD 01/01/2023 (Velcade 3 weeks on/1 week off) Cycle 2 daratumumab-VRD 01/29/2023, Revlimid discontinued after 19 days due to a pruritic rash Cycle 3 daratumumab-VRD 02/26/2023, Revlimid dose reduced to 10 mg Cycle 4 daratumumab-VRD 03/26/2023, Revlimid 10 mg x18 days, dex changed to prednisone MWF due to rash   2.  Left shoulder pain-likely secondary to a lytic lesion at the left humeral head/neck 3.  Left chin/distal mandible numbness-likely secondary to myeloma compressing nerve roots at the brainstem or left face 4.  Hypertension 5.  Diabetes 6.  Sleep apnea 7.  Nephrolithiasis-right ureteral calculus urinary tract infection, placement of a right ureter stent right stone dislodgment 10/01/2022 8.  Asthma      Disposition:  [name] appears stable. Tolerating Dara-VRD moderately well with constipation and possibly early neuropathy in the feet. We reviewed symptom management. Shoulder pain could be secondary to Zometa. Will monitor closely. I reviewed the most recent MM panel which shows that IgG and light chains have normalized, and M protein now 0.8, this is consistent with a good clinical response to treatment. CBC and CMP adequate for treatment.  Due to Revlimid-related rash, the plan is to proceed with C4 Dara-VRD with Revlimid 10 mg daily x18 days, and change weekly Dex to Prednisone 40 mg MWF. He will start today. He knows to call us with recurrent rash. If he does not tolerate this, Dr. Melrose Nakayama has suggested Cytoxan/velcade/dex.   He will f/up with [facility] as scheduled and return for an office visit and Velcade 8/16.   All questions were answered. The patient knows to call the clinic with any problems, questions or concerns. No barriers to learning were detected. I spent 20 minutes counseling the patient face to face. The total time spent in the appointment was 30 minutes and more than 50% was on counseling, review of test results, and coordination of  care.   Santiago Glad, NP-C 03/26/2023

## 2023-03-26 ENCOUNTER — Encounter: Payer: Self-pay | Admitting: Nurse Practitioner

## 2023-03-26 ENCOUNTER — Inpatient Hospital Stay: Payer: Medicare Other | Attending: Nurse Practitioner | Admitting: Nurse Practitioner

## 2023-03-26 ENCOUNTER — Inpatient Hospital Stay: Payer: Medicare Other

## 2023-03-26 VITALS — BP 131/65 | HR 67 | Temp 98.1°F | Resp 18 | Ht 70.0 in | Wt 249.0 lb

## 2023-03-26 DIAGNOSIS — R21 Rash and other nonspecific skin eruption: Secondary | ICD-10-CM | POA: Diagnosis not present

## 2023-03-26 DIAGNOSIS — Z87440 Personal history of urinary (tract) infections: Secondary | ICD-10-CM | POA: Insufficient documentation

## 2023-03-26 DIAGNOSIS — R202 Paresthesia of skin: Secondary | ICD-10-CM | POA: Diagnosis not present

## 2023-03-26 DIAGNOSIS — G4730 Sleep apnea, unspecified: Secondary | ICD-10-CM | POA: Diagnosis not present

## 2023-03-26 DIAGNOSIS — E119 Type 2 diabetes mellitus without complications: Secondary | ICD-10-CM | POA: Insufficient documentation

## 2023-03-26 DIAGNOSIS — C9000 Multiple myeloma not having achieved remission: Secondary | ICD-10-CM | POA: Insufficient documentation

## 2023-03-26 DIAGNOSIS — Z7984 Long term (current) use of oral hypoglycemic drugs: Secondary | ICD-10-CM | POA: Diagnosis not present

## 2023-03-26 DIAGNOSIS — I10 Essential (primary) hypertension: Secondary | ICD-10-CM | POA: Insufficient documentation

## 2023-03-26 DIAGNOSIS — Z5112 Encounter for antineoplastic immunotherapy: Secondary | ICD-10-CM | POA: Insufficient documentation

## 2023-03-26 DIAGNOSIS — M129 Arthropathy, unspecified: Secondary | ICD-10-CM | POA: Diagnosis not present

## 2023-03-26 DIAGNOSIS — Z87442 Personal history of urinary calculi: Secondary | ICD-10-CM | POA: Insufficient documentation

## 2023-03-26 DIAGNOSIS — Z923 Personal history of irradiation: Secondary | ICD-10-CM | POA: Insufficient documentation

## 2023-03-26 DIAGNOSIS — K5900 Constipation, unspecified: Secondary | ICD-10-CM | POA: Diagnosis not present

## 2023-03-26 DIAGNOSIS — M25512 Pain in left shoulder: Secondary | ICD-10-CM | POA: Diagnosis not present

## 2023-03-26 DIAGNOSIS — J45909 Unspecified asthma, uncomplicated: Secondary | ICD-10-CM | POA: Diagnosis not present

## 2023-03-26 LAB — CMP (CANCER CENTER ONLY)
ALT: 12 U/L (ref 0–44)
AST: 13 U/L — ABNORMAL LOW (ref 15–41)
Albumin: 4.1 g/dL (ref 3.5–5.0)
Alkaline Phosphatase: 57 U/L (ref 38–126)
Anion gap: 7 (ref 5–15)
BUN: 20 mg/dL (ref 8–23)
CO2: 26 mmol/L (ref 22–32)
Calcium: 9.1 mg/dL (ref 8.9–10.3)
Chloride: 107 mmol/L (ref 98–111)
Creatinine: 0.78 mg/dL (ref 0.61–1.24)
GFR, Estimated: >60 mL/min (ref >60)
Glucose, Bld: 103 mg/dL — ABNORMAL HIGH (ref 70–99)
Potassium: 4.1 mmol/L (ref 3.5–5.1)
Sodium: 140 mmol/L (ref 135–145)
Total Bilirubin: 0.4 mg/dL (ref 0.3–1.2)
Total Protein: 6.5 g/dL (ref 6.5–8.1)

## 2023-03-26 LAB — CBC WITH DIFFERENTIAL (CANCER CENTER ONLY)
Abs Immature Granulocytes: 0.02 10*3/uL (ref 0.00–0.07)
Basophils Absolute: 0 10*3/uL (ref 0.0–0.1)
Basophils Relative: 1 %
Eosinophils Absolute: 0.4 10*3/uL (ref 0.0–0.5)
Eosinophils Relative: 5 %
HCT: 39.9 % (ref 39.0–52.0)
Hemoglobin: 13.4 g/dL (ref 13.0–17.0)
Immature Granulocytes: 0 %
Lymphocytes Relative: 19 %
Lymphs Abs: 1.2 10*3/uL (ref 0.7–4.0)
MCH: 31.1 pg (ref 26.0–34.0)
MCHC: 33.6 g/dL (ref 30.0–36.0)
MCV: 92.6 fL (ref 80.0–100.0)
Monocytes Absolute: 0.7 10*3/uL (ref 0.1–1.0)
Monocytes Relative: 10 %
Neutro Abs: 4.2 10*3/uL (ref 1.7–7.7)
Neutrophils Relative %: 65 %
Platelet Count: 185 10*3/uL (ref 150–400)
RBC: 4.31 MIL/uL (ref 4.22–5.81)
RDW: 16.5 % — ABNORMAL HIGH (ref 11.5–15.5)
WBC Count: 6.5 10*3/uL (ref 4.0–10.5)
nRBC: 0 % (ref 0.0–0.2)

## 2023-03-26 MED ORDER — DIPHENHYDRAMINE HCL 25 MG PO CAPS
25.0000 mg | ORAL_CAPSULE | Freq: Once | ORAL | Status: AC
  Administered 2023-03-26: 25 mg via ORAL
  Filled 2023-03-26: qty 1

## 2023-03-26 MED ORDER — ONDANSETRON HCL 8 MG PO TABS
8.0000 mg | ORAL_TABLET | Freq: Once | ORAL | Status: AC
  Administered 2023-03-26: 8 mg via ORAL
  Filled 2023-03-26: qty 1

## 2023-03-26 MED ORDER — ACETAMINOPHEN 325 MG PO TABS
650.0000 mg | ORAL_TABLET | Freq: Once | ORAL | Status: AC
  Administered 2023-03-26: 650 mg via ORAL
  Filled 2023-03-26: qty 2

## 2023-03-26 MED ORDER — BORTEZOMIB CHEMO SQ INJECTION 3.5 MG (2.5MG/ML)
1.3000 mg/m2 | Freq: Once | INTRAMUSCULAR | Status: AC
  Administered 2023-03-26: 3 mg via SUBCUTANEOUS
  Filled 2023-03-26: qty 1.2

## 2023-03-26 MED ORDER — DARATUMUMAB-HYALURONIDASE-FIHJ 1800-30000 MG-UT/15ML ~~LOC~~ SOLN
1800.0000 mg | Freq: Once | SUBCUTANEOUS | Status: AC
  Administered 2023-03-26: 1800 mg via SUBCUTANEOUS
  Filled 2023-03-26: qty 15

## 2023-03-26 NOTE — Progress Notes (Signed)
Patient seen by  Cira Rue  Vitals are within treatment parameters.  Labs reviewed by  Cira Rue  and are within treatment parameters.  Per physician team, patient is ready for treatment and there are NO modifications to the treatment plan.

## 2023-03-26 NOTE — Progress Notes (Signed)
Pt want to hold Zometa today. He is starting some new medications and wants to wait.

## 2023-03-26 NOTE — Patient Instructions (Signed)
[facility]   Discharge Instructions: Thank you for choosing [facility] to provide your oncology and hematology care.   If you have a lab appointment with the Cancer Center, please go directly to the Cancer Center and check in at the registration area.   Wear comfortable clothing and clothing appropriate for easy access to any Portacath or PICC line.   We strive to give you quality time with your provider. You may need to reschedule your appointment if you arrive late (15 or more minutes).  Arriving late affects you and other patients whose appointments are after yours.  Also, if you miss three or more appointments without notifying the office, you may be dismissed from the clinic at the provider's discretion.      For prescription refill requests, have your pharmacy contact our office and allow 72 hours for refills to be completed.    Today you received the following chemotherapy and/or immunotherapy agents Velcade/Darzalex Faspro      To help prevent nausea and vomiting after your treatment, we encourage you to take your nausea medication as directed.  BELOW ARE SYMPTOMS THAT SHOULD BE REPORTED IMMEDIATELY: *FEVER GREATER THAN 100.4 F (38 C) OR HIGHER *CHILLS OR SWEATING *NAUSEA AND VOMITING THAT IS NOT CONTROLLED WITH YOUR NAUSEA MEDICATION *UNUSUAL SHORTNESS OF BREATH *UNUSUAL BRUISING OR BLEEDING *URINARY PROBLEMS (pain or burning when urinating, or frequent urination) *BOWEL PROBLEMS (unusual diarrhea, constipation, pain near the anus) TENDERNESS IN MOUTH AND THROAT WITH OR WITHOUT PRESENCE OF ULCERS (sore throat, sores in mouth, or a toothache) UNUSUAL RASH, SWELLING OR PAIN  UNUSUAL VAGINAL DISCHARGE OR ITCHING   Items with * indicate a potential emergency and should be followed up as soon as possible or go to the Emergency Department if any problems should occur.  Please show the CHEMOTHERAPY ALERT CARD or IMMUNOTHERAPY ALERT  CARD at check-in to the Emergency Department and triage nurse.  Should you have questions after your visit or need to cancel or reschedule your appointment, please contact [facility]  Dept: [phone]  and follow the prompts.  Office hours are 8:00 a.m. to 4:30 p.m. Monday - Friday. Please note that voicemails left after 4:00 p.m. may not be returned until the following business day.  We are closed weekends and major holidays. You have access to a nurse at all times for urgent questions. Please call the main number to the clinic Dept: [phone] and follow the prompts.   For any non-urgent questions, you may also contact your provider using MyChart. We now offer e-Visits for anyone 45 and older to request care online for non-urgent symptoms. For details visit [email or web address].   Also download the MyChart app! Go to the app store, search "MyChart", open the app, select [place], and log in with your MyChart username and password.  Bortezomib Injection What is this medication? BORTEZOMIB (bor TEZ oh mib) treats lymphoma. It may also be used to treat multiple myeloma, a type of bone marrow cancer. It works by blocking a protein that causes cancer cells to grow and multiply. This helps to slow or stop the spread of cancer cells. This medicine may be used for other purposes; ask your health care provider or pharmacist if you have questions. COMMON BRAND NAME(S): Velcade What should I tell my care team before I take this medication? They need to know if you have any of these conditions: Dehydration Diabetes Heart disease Liver disease Tingling  of the fingers or toes or other nerve disorder An unusual or allergic reaction to bortezomib, other medications, foods, dyes, or preservatives If you or your partner are pregnant or trying to get pregnant Breastfeeding How should I use this medication? This medication is injected into a vein or under the  skin. It is given by your care team in a hospital or clinic setting. Talk to your care team about the use of this medication in children. Special care may be needed. Overdosage: If you think you have taken too much of this medicine contact a poison control center or emergency room at once. NOTE: This medicine is only for you. Do not share this medicine with others. What if I miss a dose? Keep appointments for follow-up doses. It is important not to miss your dose. Call your care team if you are unable to keep an appointment. What may interact with this medication? Ketoconazole Rifampin This list may not describe all possible interactions. Give your health care provider a list of all the medicines, herbs, non-prescription drugs, or dietary supplements you use. Also tell them if you smoke, drink alcohol, or use illegal drugs. Some items may interact with your medicine. What should I watch for while using this medication? Your condition will be monitored carefully while you are receiving this medication. You may need blood work while taking this medication. This medication may affect your coordination, reaction time, or judgment. Do not drive or operate machinery until you know how this medication affects you. Sit up or stand slowly to reduce the risk of dizzy or fainting spells. Drinking alcohol with this medication can increase the risk of these side effects. This medication may increase your risk of getting an infection. Call your care team for advice if you get a fever, chills, sore throat, or other symptoms of a cold or flu. Do not treat yourself. Try to avoid being around people who are sick. Check with your care team if you have severe diarrhea, nausea, and vomiting, or if you sweat a lot. The loss of too much body fluid may make it dangerous for you to take this medication. Talk to your care team if you may be pregnant. Serious birth defects can occur if you take this medication during pregnancy  and for 7 months after the last dose. You will need a negative pregnancy test before starting this medication. Contraception is recommended while taking this medication and for 7 months after the last dose. Your care team can help you find the option that works for you. If your partner can get pregnant, use a condom during sex while taking this medication and for 4 months after the last dose. Do not breastfeed while taking this medication and for 2 months after the last dose. This medication may cause infertility. Talk to your care team if you are concerned about your fertility. What side effects may I notice from receiving this medication? Side effects that you should report to your care team as soon as possible: Allergic reactions--skin rash, itching, hives, swelling of the face, lips, tongue, or throat Bleeding--bloody or black, tar-like stools, vomiting blood or brown material that looks like coffee grounds, red or dark brown urine, small red or purple spots on skin, unusual bruising or bleeding Bleeding in the brain--severe headache, stiff neck, confusion, dizziness, change in vision, numbness or weakness of the face, arm, or leg, trouble speaking, trouble walking, vomiting Bowel blockage--stomach cramping, unable to have a bowel movement or pass gas, loss of  appetite, vomiting Heart failure--shortness of breath, swelling of the ankles, feet, or hands, sudden weight gain, unusual weakness or fatigue Infection--fever, chills, cough, sore throat, wounds that don't heal, pain or trouble when passing urine, general feeling of discomfort or being unwell Liver injury--right upper belly pain, loss of appetite, nausea, light-colored stool, dark yellow or brown urine, yellowing skin or eyes, unusual weakness or fatigue Low blood pressure--dizziness, feeling faint or lightheaded, blurry vision Lung injury--shortness of breath or trouble breathing, cough, spitting up blood, chest pain, fever Pain, tingling,  or numbness in the hands or feet Severe or prolonged diarrhea Stomach pain, bloody diarrhea, pale skin, unusual weakness or fatigue, decrease in the amount of urine, which may be signs of hemolytic uremic syndrome Sudden and severe headache, confusion, change in vision, seizures, which may be signs of posterior reversible encephalopathy syndrome (PRES) TTP--purple spots on the skin or inside the mouth, pale skin, yellowing skin or eyes, unusual weakness or fatigue, fever, fast or irregular heartbeat, confusion, change in vision, trouble speaking, trouble walking Tumor lysis syndrome (TLS)--nausea, vomiting, diarrhea, decrease in the amount of urine, dark urine, unusual weakness or fatigue, confusion, muscle pain or cramps, fast or irregular heartbeat, joint pain Side effects that usually do not require medical attention (report to your care team if they continue or are bothersome): Constipation Diarrhea Fatigue Loss of appetite Nausea This list may not describe all possible side effects. Call your doctor for medical advice about side effects. You may report side effects to FDA at 1-800-FDA-1088. Where should I keep my medication? This medication is given in a hospital or clinic. It will not be stored at home. NOTE: This sheet is a summary. It may not cover all possible information. If you have questions about this medicine, talk to your doctor, pharmacist, or health care provider.  2024 Elsevier/Gold Standard (2022-01-06 00:00:00)  Daratumumab; Hyaluronidase Injection What is this medication? DARATUMUMAB; HYALURONIDASE (dar a toom ue mab; hye al ur ON i dase) treats multiple myeloma, a type of bone marrow cancer. Daratumumab works by blocking a protein that causes cancer cells to grow and multiply. This helps to slow or stop the spread of cancer cells. Hyaluronidase works by increasing the absorption of other medications in the body to help them work better. This medication may also be used  treat amyloidosis, a condition that causes the buildup of a protein (amyloid) in your body. It works by reducing the buildup of this protein, which decreases symptoms. It is a combination medication that contains a monoclonal antibody. This medicine may be used for other purposes; ask your health care provider or pharmacist if you have questions. COMMON BRAND NAME(S): DARZALEX FASPRO What should I tell my care team before I take this medication? They need to know if you have any of these conditions: Heart disease Infection, such as chickenpox, cold sores, herpes, hepatitis B Lung or breathing disease An unusual or allergic reaction to daratumumab, hyaluronidase, other medications, foods, dyes, or preservatives Pregnant or trying to get pregnant Breast-feeding How should I use this medication? This medication is injected under the skin. It is given by your care team in a hospital or clinic setting. Talk to your care team about the use of this medication in children. Special care may be needed. Overdosage: If you think you have taken too much of this medicine contact a poison control center or emergency room at once. NOTE: This medicine is only for you. Do not share this medicine with others. What if I  miss a dose? Keep appointments for follow-up doses. It is important not to miss your dose. Call your care team if you are unable to keep an appointment. What may interact with this medication? Interactions have not been studied. This list may not describe all possible interactions. Give your health care provider a list of all the medicines, herbs, non-prescription drugs, or dietary supplements you use. Also tell them if you smoke, drink alcohol, or use illegal drugs. Some items may interact with your medicine. What should I watch for while using this medication? Your condition will be monitored carefully while you are receiving this medication. This medication can cause serious allergic reactions.  To reduce your risk, your care team may give you other medication to take before receiving this one. Be sure to follow the directions from your care team. This medication can affect the results of blood tests to match your blood type. These changes can last for up to 6 months after the final dose. Your care team will do blood tests to match your blood type before you start treatment. Tell all of your care team that you are being treated with this medication before receiving a blood transfusion. This medication can affect the results of some tests used to determine treatment response; extra tests may be needed to evaluate response. Talk to your care team if you wish to become pregnant or think you are pregnant. This medication can cause serious birth defects if taken during pregnancy and for 3 months after the last dose. A reliable form of contraception is recommended while taking this medication and for 3 months after the last dose. Talk to your care team about effective forms of contraception. Do not breast-feed while taking this medication. What side effects may I notice from receiving this medication? Side effects that you should report to your care team as soon as possible: Allergic reactions--skin rash, itching, hives, swelling of the face, lips, tongue, or throat Heart rhythm changes--fast or irregular heartbeat, dizziness, feeling faint or lightheaded, chest pain, trouble breathing Infection--fever, chills, cough, sore throat, wounds that don't heal, pain or trouble when passing urine, general feeling of discomfort or being unwell Infusion reactions--chest pain, shortness of breath or trouble breathing, feeling faint or lightheaded Sudden eye pain or change in vision such as blurry vision, seeing halos around lights, vision loss Unusual bruising or bleeding Side effects that usually do not require medical attention (report to your care team if they continue or are  bothersome): Constipation Diarrhea Fatigue Nausea Pain, tingling, or numbness in the hands or feet Swelling of the ankles, hands, or feet This list may not describe all possible side effects. Call your doctor for medical advice about side effects. You may report side effects to FDA at 1-800-FDA-1088. Where should I keep my medication? This medication is given in a hospital or clinic. It will not be stored at home. NOTE: This sheet is a summary. It may not cover all possible information. If you have questions about this medicine, talk to your doctor, pharmacist, or health care provider.  2024 Elsevier/Gold Standard (2021-12-09 00:00:00)

## 2023-04-02 ENCOUNTER — Inpatient Hospital Stay: Payer: Medicare Other

## 2023-04-02 ENCOUNTER — Ambulatory Visit (HOSPITAL_BASED_OUTPATIENT_CLINIC_OR_DEPARTMENT_OTHER)
Admission: RE | Admit: 2023-04-02 | Discharge: 2023-04-02 | Disposition: A | Discharge status: Home / Self Care | Payer: Medicare Other | Source: Ambulatory Visit | Attending: Nurse Practitioner | Admitting: Nurse Practitioner

## 2023-04-02 ENCOUNTER — Encounter: Payer: Self-pay | Admitting: Nurse Practitioner

## 2023-04-02 ENCOUNTER — Inpatient Hospital Stay (HOSPITAL_BASED_OUTPATIENT_CLINIC_OR_DEPARTMENT_OTHER): Payer: Medicare Other | Admitting: Nurse Practitioner

## 2023-04-02 VITALS — BP 136/65 | HR 71 | Temp 98.2°F | Resp 18 | Ht 70.0 in | Wt 254.5 lb

## 2023-04-02 DIAGNOSIS — C9000 Multiple myeloma not having achieved remission: Secondary | ICD-10-CM

## 2023-04-02 LAB — CMP (CANCER CENTER ONLY)
ALT: 12 U/L (ref 0–44)
AST: 11 U/L — ABNORMAL LOW (ref 15–41)
Albumin: 3.8 g/dL (ref 3.5–5.0)
Alkaline Phosphatase: 51 U/L (ref 38–126)
Anion gap: 7 (ref 5–15)
BUN: 18 mg/dL (ref 8–23)
CO2: 28 mmol/L (ref 22–32)
Calcium: 8.7 mg/dL — ABNORMAL LOW (ref 8.9–10.3)
Chloride: 107 mmol/L (ref 98–111)
Creatinine: 0.89 mg/dL (ref 0.61–1.24)
GFR, Estimated: >60 mL/min (ref >60)
Glucose, Bld: 130 mg/dL — ABNORMAL HIGH (ref 70–99)
Potassium: 3.9 mmol/L (ref 3.5–5.1)
Sodium: 142 mmol/L (ref 135–145)
Total Bilirubin: 0.6 mg/dL (ref 0.3–1.2)
Total Protein: 6.3 g/dL — ABNORMAL LOW (ref 6.5–8.1)

## 2023-04-02 LAB — CBC WITH DIFFERENTIAL (CANCER CENTER ONLY)
Abs Immature Granulocytes: 0.01 10*3/uL (ref 0.00–0.07)
Basophils Absolute: 0 10*3/uL (ref 0.0–0.1)
Basophils Relative: 0 %
Eosinophils Absolute: 0.2 10*3/uL (ref 0.0–0.5)
Eosinophils Relative: 4 %
HCT: 38.5 % — ABNORMAL LOW (ref 39.0–52.0)
Hemoglobin: 12.9 g/dL — ABNORMAL LOW (ref 13.0–17.0)
Immature Granulocytes: 0 %
Lymphocytes Relative: 17 %
Lymphs Abs: 1.1 10*3/uL (ref 0.7–4.0)
MCH: 31.2 pg (ref 26.0–34.0)
MCHC: 33.5 g/dL (ref 30.0–36.0)
MCV: 93.2 fL (ref 80.0–100.0)
Monocytes Absolute: 0.7 10*3/uL (ref 0.1–1.0)
Monocytes Relative: 11 %
Neutro Abs: 4.4 10*3/uL (ref 1.7–7.7)
Neutrophils Relative %: 68 %
Platelet Count: 135 10*3/uL — ABNORMAL LOW (ref 150–400)
RBC: 4.13 MIL/uL — ABNORMAL LOW (ref 4.22–5.81)
RDW: 16.4 % — ABNORMAL HIGH (ref 11.5–15.5)
WBC Count: 6.5 10*3/uL (ref 4.0–10.5)
nRBC: 0 % (ref 0.0–0.2)

## 2023-04-02 MED ORDER — SODIUM CHLORIDE 0.9 % IV SOLN: Freq: Once | INTRAVENOUS | Status: AC

## 2023-04-02 MED ORDER — BORTEZOMIB CHEMO SQ INJECTION 3.5 MG (2.5MG/ML)
1.3000 mg/m2 | Freq: Once | INTRAMUSCULAR | Status: AC
  Administered 2023-04-02: 3 mg via SUBCUTANEOUS
  Filled 2023-04-02: qty 1.2

## 2023-04-02 MED ORDER — ZOLEDRONIC ACID 4 MG/100ML IV SOLN
4.0000 mg | Freq: Once | INTRAVENOUS | Status: AC
  Administered 2023-04-02: 4 mg via INTRAVENOUS
  Filled 2023-04-02: qty 100

## 2023-04-02 MED ORDER — ONDANSETRON HCL 8 MG PO TABS
8.0000 mg | ORAL_TABLET | Freq: Once | ORAL | Status: AC
  Administered 2023-04-02: 8 mg via ORAL
  Filled 2023-04-02: qty 1

## 2023-04-02 NOTE — Progress Notes (Signed)
When administering velcade, there was a leak in the chemo between the syringe connection and the phaseal. The injection was stopped, patient's skin was cleaned, and patient was instructed to wash hands with soap as they were exposed to the chemo.  Pharmacy was informed and a new velcade dose was prepared. Patient was instructed to wash clothing twice with normal laundry detergent incase the chemo got on his clothing and to contact office if he had any future questions or concerns. Patient verbalized understanding.

## 2023-04-02 NOTE — Progress Notes (Signed)
[facility] OFFICE PROGRESS NOTE   Diagnosis: Multiple myeloma  INTERVAL HISTORY:   [name] returns as scheduled.  He began cycle for daratumumab-VRD 03/26/2023.  He is taking prednisone on a Monday Wednesday Friday schedule due to a rash.  The rash has not recurred.  No pruritus.  No diarrhea.  No nausea or vomiting.  Mild tingling in the upper one half of both feet.  No balance issues.  No numbness or tingling in the hands.  He reports pain at the right posterior more lateral lower back.  He wonders if the pain is activity related.  He has had the same pain intermittently for many months.  The pain worsened last night after a lot of bending during the day.  He denies any dental issues.  Objective:  Vital signs in last 24 hours:  Blood pressure 136/65, pulse 71, temperature 98.2 F (36.8 C), temperature source Oral, resp. rate 18, height 5\' 10"  (1.778 m), weight 254 lb 8 oz (115.4 kg), SpO2 98%.    HEENT: No thrush or ulcers. Resp: Lungs clear bilaterally. Cardio: Regular rate and rhythm. GI: No hepatosplenomegaly. Vascular: No leg edema. Musculoskeletal: Tender with deep palpation over the right posterior iliac region. Neuro: Alert and oriented. Skin: No rash.   Lab Results:  Lab Results  Component Value Date   WBC 6.5 03/26/2023   HGB 13.4 03/26/2023   HCT 39.9 03/26/2023   MCV 92.6 03/26/2023   PLT 185 03/26/2023   NEUTROABS 4.2 03/26/2023    Imaging:  No results found.  Medications: I have reviewed the patient's current medications.  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 numerous enhancing lesions of the skull base and calvarium no evidence of extraosseous component 12/17/2022-MRI of the cervical, thoracic, and lumbar spine-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, clivus, bilateral occipital condyles, ribs, sacrum, and iliac bone, no evidence of pathologic fracture, expansile lesion in C3 extends into the spinal canal by 4 mm with moderate to severe spinal canal stenosis posterior to C3, mild right foraminal narrowing at T2-T3 and T3-4 related to osseous tumor at T3 12/17/2022-SPEP-2.6 g serum M spike, IgG kappa Bone marrow biopsy 12/18/2022-multiple myeloma with plasma cells involving 60% of the cellular marrow, kappa restricted myeloma FISH panel-QNS Bone survey 12/18/2022-lucencies at the skull, right scapula, left clavicle, lumbar spine, pelvis, and left femoral neck.  Destructive lesion at C3, possible sclerotic lucent lesions in the thoracic spine 12/18/2022 pelvic radiation to C3 and humeral head-12/31/2022 Cycle 1 daratumumab-VRD 01/01/2023 (Velcade 3 weeks on/1 week off) Cycle 2 daratumumab-VRD 01/29/2023, Revlimid discontinued after 19 days due to a pruritic rash Cycle 3 daratumumab-VRD 02/26/2023, Revlimid dose reduced to 10 mg Cycle 4 daratumumab-VRD 03/26/2023, Revlimid 10 mg x 18 days, Dex changed to prednisone Monday Wednesday Friday due to rash   2.  Left shoulder pain-likely secondary to a lytic lesion at the left humeral head/neck 3.  Left chin/distal mandible numbness-likely secondary to myeloma compressing nerve roots at the brainstem or left face 4.  Hypertension 5.  Diabetes 6.  Sleep apnea 7.  Nephrolithiasis-right ureteral calculus urinary tract infection, placement of a right ureter stent right stone dislodgment 10/01/2022 8.  Asthma  Disposition: Jeffery Higgins appears stable.  He began cycle 4 daratumumab-RVD 03/26/2023.  He seems to be tolerating treatment well.  The rash has not recurred.  He will continue  Revlimid.  Plan to proceed with day 8 Velcade as scheduled.  He is also scheduled to receive Zometa today.  He will return for Velcade/daratumumab in week.  CBC and chemistry panel reviewed.  Labs adequate to proceed as above.  We referred him for an x-ray of the right pelvis.  He will return for follow-up and cycle 5 daratumumab-RVD 04/23/2023.  He will contact the office in the interim with any problems.    Lonna Cobb ANP/GNP-BC   04/02/2023  8:26 AM

## 2023-04-02 NOTE — Patient Instructions (Addendum)
[facility]  Discharge Instructions: Thank you for choosing [facility] to provide your oncology and hematology care.   If you have a lab appointment with the Cancer Center, please go directly to the Cancer Center and check in at the registration area.   Wear comfortable clothing and clothing appropriate for easy access to any Portacath or PICC line.   We strive to give you quality time with your provider. You may need to reschedule your appointment if you arrive late (15 or more minutes).  Arriving late affects you and other patients whose appointments are after yours.  Also, if you miss three or more appointments without notifying the office, you may be dismissed from the clinic at the provider's discretion.      For prescription refill requests, have your pharmacy contact our office and allow 72 hours for refills to be completed.    Today you received the following chemotherapy and/or immunotherapy agents: velcade      To help prevent nausea and vomiting after your treatment, we encourage you to take your nausea medication as directed.  BELOW ARE SYMPTOMS THAT SHOULD BE REPORTED IMMEDIATELY: *FEVER GREATER THAN 100.4 F (38 C) OR HIGHER *CHILLS OR SWEATING *NAUSEA AND VOMITING THAT IS NOT CONTROLLED WITH YOUR NAUSEA MEDICATION *UNUSUAL SHORTNESS OF BREATH *UNUSUAL BRUISING OR BLEEDING *URINARY PROBLEMS (pain or burning when urinating, or frequent urination) *BOWEL PROBLEMS (unusual diarrhea, constipation, pain near the anus) TENDERNESS IN MOUTH AND THROAT WITH OR WITHOUT PRESENCE OF ULCERS (sore throat, sores in mouth, or a toothache) UNUSUAL RASH, SWELLING OR PAIN  UNUSUAL VAGINAL DISCHARGE OR ITCHING   Items with * indicate a potential emergency and should be followed up as soon as possible or go to the Emergency Department if any problems should occur.  Please show the CHEMOTHERAPY ALERT CARD or IMMUNOTHERAPY ALERT CARD at check-in  to the Emergency Department and triage nurse.  Should you have questions after your visit or need to cancel or reschedule your appointment, please contact [facility]  Dept: [phone]  and follow the prompts.  Office hours are 8:00 a.m. to 4:30 p.m. Monday - Friday. Please note that voicemails left after 4:00 p.m. may not be returned until the following business day.  We are closed weekends and major holidays. You have access to a nurse at all times for urgent questions. Please call the main number to the clinic Dept: [phone] and follow the prompts.   For any non-urgent questions, you may also contact your provider using MyChart. We now offer e-Visits for anyone 14 and older to request care online for non-urgent symptoms. For details visit [email or web address].   Also download the MyChart app! Go to the app store, search "MyChart", open the app, select [place], and log in with your MyChart username and password.  Bortezomib Injection What is this medication? BORTEZOMIB (bor TEZ oh mib) treats lymphoma. It may also be used to treat multiple myeloma, a type of bone marrow cancer. It works by blocking a protein that causes cancer cells to grow and multiply. This helps to slow or stop the spread of cancer cells. This medicine may be used for other purposes; ask your health care provider or pharmacist if you have questions. COMMON BRAND NAME(S): Velcade What should I tell my care team before I take this medication? They need to know if you have any of these conditions: Dehydration Diabetes Heart disease Liver disease Tingling of the  fingers or toes or other nerve disorder An unusual or allergic reaction to bortezomib, other medications, foods, dyes, or preservatives If you or your partner are pregnant or trying to get pregnant Breastfeeding How should I use this medication? This medication is injected into a vein or under the skin. It is given by  your care team in a hospital or clinic setting. Talk to your care team about the use of this medication in children. Special care may be needed. Overdosage: If you think you have taken too much of this medicine contact a poison control center or emergency room at once. NOTE: This medicine is only for you. Do not share this medicine with others. What if I miss a dose? Keep appointments for follow-up doses. It is important not to miss your dose. Call your care team if you are unable to keep an appointment. What may interact with this medication? Ketoconazole Rifampin This list may not describe all possible interactions. Give your health care provider a list of all the medicines, herbs, non-prescription drugs, or dietary supplements you use. Also tell them if you smoke, drink alcohol, or use illegal drugs. Some items may interact with your medicine. What should I watch for while using this medication? Your condition will be monitored carefully while you are receiving this medication. You may need blood work while taking this medication. This medication may affect your coordination, reaction time, or judgment. Do not drive or operate machinery until you know how this medication affects you. Sit up or stand slowly to reduce the risk of dizzy or fainting spells. Drinking alcohol with this medication can increase the risk of these side effects. This medication may increase your risk of getting an infection. Call your care team for advice if you get a fever, chills, sore throat, or other symptoms of a cold or flu. Do not treat yourself. Try to avoid being around people who are sick. Check with your care team if you have severe diarrhea, nausea, and vomiting, or if you sweat a lot. The loss of too much body fluid may make it dangerous for you to take this medication. Talk to your care team if you may be pregnant. Serious birth defects can occur if you take this medication during pregnancy and for 7 months  after the last dose. You will need a negative pregnancy test before starting this medication. Contraception is recommended while taking this medication and for 7 months after the last dose. Your care team can help you find the option that works for you. If your partner can get pregnant, use a condom during sex while taking this medication and for 4 months after the last dose. Do not breastfeed while taking this medication and for 2 months after the last dose. This medication may cause infertility. Talk to your care team if you are concerned about your fertility. What side effects may I notice from receiving this medication? Side effects that you should report to your care team as soon as possible: Allergic reactions--skin rash, itching, hives, swelling of the face, lips, tongue, or throat Bleeding--bloody or black, tar-like stools, vomiting blood or brown material that looks like coffee grounds, red or dark brown urine, small red or purple spots on skin, unusual bruising or bleeding Bleeding in the brain--severe headache, stiff neck, confusion, dizziness, change in vision, numbness or weakness of the face, arm, or leg, trouble speaking, trouble walking, vomiting Bowel blockage--stomach cramping, unable to have a bowel movement or pass gas, loss of appetite, vomiting  Heart failure--shortness of breath, swelling of the ankles, feet, or hands, sudden weight gain, unusual weakness or fatigue Infection--fever, chills, cough, sore throat, wounds that don't heal, pain or trouble when passing urine, general feeling of discomfort or being unwell Liver injury--right upper belly pain, loss of appetite, nausea, light-colored stool, dark yellow or brown urine, yellowing skin or eyes, unusual weakness or fatigue Low blood pressure--dizziness, feeling faint or lightheaded, blurry vision Lung injury--shortness of breath or trouble breathing, cough, spitting up blood, chest pain, fever Pain, tingling, or numbness in  the hands or feet Severe or prolonged diarrhea Stomach pain, bloody diarrhea, pale skin, unusual weakness or fatigue, decrease in the amount of urine, which may be signs of hemolytic uremic syndrome Sudden and severe headache, confusion, change in vision, seizures, which may be signs of posterior reversible encephalopathy syndrome (PRES) TTP--purple spots on the skin or inside the mouth, pale skin, yellowing skin or eyes, unusual weakness or fatigue, fever, fast or irregular heartbeat, confusion, change in vision, trouble speaking, trouble walking Tumor lysis syndrome (TLS)--nausea, vomiting, diarrhea, decrease in the amount of urine, dark urine, unusual weakness or fatigue, confusion, muscle pain or cramps, fast or irregular heartbeat, joint pain Side effects that usually do not require medical attention (report to your care team if they continue or are bothersome): Constipation Diarrhea Fatigue Loss of appetite Nausea This list may not describe all possible side effects. Call your doctor for medical advice about side effects. You may report side effects to FDA at 1-800-FDA-1088. Where should I keep my medication? This medication is given in a hospital or clinic. It will not be stored at home. NOTE: This sheet is a summary. It may not cover all possible information. If you have questions about this medicine, talk to your doctor, pharmacist, or health care provider.  2024 Elsevier/Gold Standard (2022-01-06 00:00:00)   Zoledronic Acid Injection (Cancer) What is this medication? ZOLEDRONIC ACID (ZOE le dron ik AS id) treats high calcium levels in the blood caused by cancer. It may also be used with chemotherapy to treat weakened bones caused by cancer. It works by slowing down the release of calcium from bones. This lowers calcium levels in your blood. It also makes your bones stronger and less likely to break (fracture). It belongs to a group of medications called bisphosphonates. This medicine  may be used for other purposes; ask your health care provider or pharmacist if you have questions. COMMON BRAND NAME(S): Zometa, Zometa Powder What should I tell my care team before I take this medication? They need to know if you have any of these conditions: Dehydration Dental disease Kidney disease Liver disease Low levels of calcium in the blood Lung or breathing disease, such as asthma Receiving steroids, such as dexamethasone or prednisone An unusual or allergic reaction to zoledronic acid, other medications, foods, dyes, or preservatives Pregnant or trying to get pregnant Breast-feeding How should I use this medication? This medication is injected into a vein. It is given by your care team in a hospital or clinic setting. Talk to your care team about the use of this medication in children. Special care may be needed. Overdosage: If you think you have taken too much of this medicine contact a poison control center or emergency room at once. NOTE: This medicine is only for you. Do not share this medicine with others. What if I miss a dose? Keep appointments for follow-up doses. It is important not to miss your dose. Call your care team if you  are unable to keep an appointment. What may interact with this medication? Certain antibiotics given by injection Diuretics, such as bumetanide, furosemide NSAIDs, medications for pain and inflammation, such as ibuprofen or naproxen Teriparatide Thalidomide This list may not describe all possible interactions. Give your health care provider a list of all the medicines, herbs, non-prescription drugs, or dietary supplements you use. Also tell them if you smoke, drink alcohol, or use illegal drugs. Some items may interact with your medicine. What should I watch for while using this medication? Visit your care team for regular checks on your progress. It may be some time before you see the benefit from this medication. Some people who take this  medication have severe bone, joint, or muscle pain. This medication may also increase your risk for jaw problems or a broken thigh bone. Tell your care team right away if you have severe pain in your jaw, bones, joints, or muscles. Tell you care team if you have any pain that does not go away or that gets worse. Tell your dentist and dental surgeon that you are taking this medication. You should not have major dental surgery while on this medication. See your dentist to have a dental exam and fix any dental problems before starting this medication. Take good care of your teeth while on this medication. Make sure you see your dentist for regular follow-up appointments. You should make sure you get enough calcium and vitamin D while you are taking this medication. Discuss the foods you eat and the vitamins you take with your care team. Check with your care team if you have severe diarrhea, nausea, and vomiting, or if you sweat a lot. The loss of too much body fluid may make it dangerous for you to take this medication. You may need bloodwork while taking this medication. Talk to your care team if you wish to become pregnant or think you might be pregnant. This medication can cause serious birth defects. What side effects may I notice from receiving this medication? Side effects that you should report to your care team as soon as possible: Allergic reactions--skin rash, itching, hives, swelling of the face, lips, tongue, or throat Kidney injury--decrease in the amount of urine, swelling of the ankles, hands, or feet Low calcium level--muscle pain or cramps, confusion, tingling, or numbness in the hands or feet Osteonecrosis of the jaw--pain, swelling, or redness in the mouth, numbness of the jaw, poor healing after dental work, unusual discharge from the mouth, visible bones in the mouth Severe bone, joint, or muscle pain Side effects that usually do not require medical attention (report to your care team  if they continue or are bothersome): Constipation Fatigue Fever Loss of appetite Nausea Stomach pain This list may not describe all possible side effects. Call your doctor for medical advice about side effects. You may report side effects to FDA at 1-800-FDA-1088. Where should I keep my medication? This medication is given in a hospital or clinic. It will not be stored at home. NOTE: This sheet is a summary. It may not cover all possible information. If you have questions about this medicine, talk to your doctor, pharmacist, or health care provider.  2024 Elsevier/Gold Standard (2021-09-26 00:00:00)

## 2023-04-02 NOTE — Progress Notes (Signed)
Patient seen by Lisa Thomas NP today  Vitals are within treatment parameters.  Labs reviewed by Lisa Thomas NP and are within treatment parameters.  Per physician team, patient is ready for treatment and there are NO modifications to the treatment plan.     

## 2023-04-03 ENCOUNTER — Other Ambulatory Visit: Payer: Self-pay

## 2023-04-04 ENCOUNTER — Other Ambulatory Visit: Payer: Self-pay | Admitting: Oncology

## 2023-04-08 ENCOUNTER — Ambulatory Visit: Payer: Medicare Other | Admitting: Oncology

## 2023-04-08 ENCOUNTER — Other Ambulatory Visit: Payer: Medicare Other

## 2023-04-08 ENCOUNTER — Ambulatory Visit: Payer: Medicare Other

## 2023-04-08 ENCOUNTER — Inpatient Hospital Stay: Payer: Medicare Other

## 2023-04-08 VITALS — BP 150/72 | Temp 98.1°F | Resp 18 | Ht 70.0 in | Wt 257.9 lb

## 2023-04-08 DIAGNOSIS — C9000 Multiple myeloma not having achieved remission: Secondary | ICD-10-CM

## 2023-04-08 LAB — CMP (CANCER CENTER ONLY)
ALT: 16 U/L (ref 0–44)
AST: 11 U/L — ABNORMAL LOW (ref 15–41)
Albumin: 4 g/dL (ref 3.5–5.0)
Alkaline Phosphatase: 58 U/L (ref 38–126)
Anion gap: 7 (ref 5–15)
BUN: 15 mg/dL (ref 8–23)
CO2: 28 mmol/L (ref 22–32)
Calcium: 8.9 mg/dL (ref 8.9–10.3)
Chloride: 105 mmol/L (ref 98–111)
Creatinine: 0.81 mg/dL (ref 0.61–1.24)
GFR, Estimated: >60 mL/min (ref >60)
Glucose, Bld: 87 mg/dL (ref 70–99)
Potassium: 4 mmol/L (ref 3.5–5.1)
Sodium: 140 mmol/L (ref 135–145)
Total Bilirubin: 0.6 mg/dL (ref 0.3–1.2)
Total Protein: 6.3 g/dL — ABNORMAL LOW (ref 6.5–8.1)

## 2023-04-08 LAB — CBC WITH DIFFERENTIAL (CANCER CENTER ONLY)
Abs Immature Granulocytes: 0.15 10*3/uL — ABNORMAL HIGH (ref 0.00–0.07)
Basophils Absolute: 0 10*3/uL (ref 0.0–0.1)
Basophils Relative: 0 %
Eosinophils Absolute: 0.2 10*3/uL (ref 0.0–0.5)
Eosinophils Relative: 2 %
HCT: 40.6 % (ref 39.0–52.0)
Hemoglobin: 13.5 g/dL (ref 13.0–17.0)
Immature Granulocytes: 1 %
Lymphocytes Relative: 12 %
Lymphs Abs: 1.3 10*3/uL (ref 0.7–4.0)
MCH: 30.8 pg (ref 26.0–34.0)
MCHC: 33.3 g/dL (ref 30.0–36.0)
MCV: 92.5 fL (ref 80.0–100.0)
Monocytes Absolute: 2.1 10*3/uL — ABNORMAL HIGH (ref 0.1–1.0)
Monocytes Relative: 18 %
Neutro Abs: 7.6 10*3/uL (ref 1.7–7.7)
Neutrophils Relative %: 67 %
Platelet Count: 153 10*3/uL (ref 150–400)
RBC: 4.39 MIL/uL (ref 4.22–5.81)
RDW: 15.9 % — ABNORMAL HIGH (ref 11.5–15.5)
WBC Count: 11.4 10*3/uL — ABNORMAL HIGH (ref 4.0–10.5)
nRBC: 0 % (ref 0.0–0.2)

## 2023-04-08 MED ORDER — BORTEZOMIB CHEMO SQ INJECTION 3.5 MG (2.5MG/ML)
1.3000 mg/m2 | Freq: Once | INTRAMUSCULAR | Status: AC
  Administered 2023-04-08: 3 mg via SUBCUTANEOUS
  Filled 2023-04-08: qty 1.2

## 2023-04-08 MED ORDER — ONDANSETRON HCL 8 MG PO TABS
8.0000 mg | ORAL_TABLET | Freq: Once | ORAL | Status: AC
  Administered 2023-04-08: 8 mg via ORAL
  Filled 2023-04-08: qty 1

## 2023-04-08 MED ORDER — ACETAMINOPHEN 325 MG PO TABS
650.0000 mg | ORAL_TABLET | Freq: Once | ORAL | Status: AC
  Administered 2023-04-08: 650 mg via ORAL
  Filled 2023-04-08: qty 2

## 2023-04-08 MED ORDER — DARATUMUMAB-HYALURONIDASE-FIHJ 1800-30000 MG-UT/15ML ~~LOC~~ SOLN
1800.0000 mg | Freq: Once | SUBCUTANEOUS | Status: AC
  Administered 2023-04-08: 1800 mg via SUBCUTANEOUS
  Filled 2023-04-08: qty 15

## 2023-04-08 MED ORDER — DIPHENHYDRAMINE HCL 25 MG PO CAPS
25.0000 mg | ORAL_CAPSULE | Freq: Once | ORAL | Status: AC
  Administered 2023-04-08: 25 mg via ORAL
  Filled 2023-04-08: qty 1

## 2023-04-08 NOTE — Progress Notes (Signed)
Pt here for treatment today. Complaining of returning rash on his back and he also has an area approximately approximately 3 x 2 area on his left lower abdomen that is red and raised and itchy. He states that he has been taking 2 prednisone Monday, Wed, Fri and 1 extra with flair ups. He is also complaining of back pain and states that he has not heard results of the Xray he had 1 week ago. Lonna Cobb, NP notified.

## 2023-04-08 NOTE — Progress Notes (Signed)
Patient instructed to stop Revlimid, continue steriods, call to check in Monday. If rash gets worse call before Monday. Per Lonna Cobb, NP

## 2023-04-08 NOTE — Patient Instructions (Signed)
[facility]   Discharge Instructions: Thank you for choosing [facility] to provide your oncology and hematology care.   If you have a lab appointment with the Cancer Center, please go directly to the Cancer Center and check in at the registration area.   Wear comfortable clothing and clothing appropriate for easy access to any Portacath or PICC line.   We strive to give you quality time with your provider. You may need to reschedule your appointment if you arrive late (15 or more minutes).  Arriving late affects you and other patients whose appointments are after yours.  Also, if you miss three or more appointments without notifying the office, you may be dismissed from the clinic at the provider's discretion.      For prescription refill requests, have your pharmacy contact our office and allow 72 hours for refills to be completed.    Today you received the following chemotherapy and/or immunotherapy agents Velcade/Darzalex Faspro      To help prevent nausea and vomiting after your treatment, we encourage you to take your nausea medication as directed.  BELOW ARE SYMPTOMS THAT SHOULD BE REPORTED IMMEDIATELY: *FEVER GREATER THAN 100.4 F (38 C) OR HIGHER *CHILLS OR SWEATING *NAUSEA AND VOMITING THAT IS NOT CONTROLLED WITH YOUR NAUSEA MEDICATION *UNUSUAL SHORTNESS OF BREATH *UNUSUAL BRUISING OR BLEEDING *URINARY PROBLEMS (pain or burning when urinating, or frequent urination) *BOWEL PROBLEMS (unusual diarrhea, constipation, pain near the anus) TENDERNESS IN MOUTH AND THROAT WITH OR WITHOUT PRESENCE OF ULCERS (sore throat, sores in mouth, or a toothache) UNUSUAL RASH, SWELLING OR PAIN  UNUSUAL VAGINAL DISCHARGE OR ITCHING   Items with * indicate a potential emergency and should be followed up as soon as possible or go to the Emergency Department if any problems should occur.  Please show the CHEMOTHERAPY ALERT CARD or IMMUNOTHERAPY ALERT  CARD at check-in to the Emergency Department and triage nurse.  Should you have questions after your visit or need to cancel or reschedule your appointment, please contact [facility]  Dept: [phone]  and follow the prompts.  Office hours are 8:00 a.m. to 4:30 p.m. Monday - Friday. Please note that voicemails left after 4:00 p.m. may not be returned until the following business day.  We are closed weekends and major holidays. You have access to a nurse at all times for urgent questions. Please call the main number to the clinic Dept: [phone] and follow the prompts.   For any non-urgent questions, you may also contact your provider using MyChart. We now offer e-Visits for anyone 45 and older to request care online for non-urgent symptoms. For details visit [email or web address].   Also download the MyChart app! Go to the app store, search "MyChart", open the app, select [place], and log in with your MyChart username and password.  Bortezomib Injection What is this medication? BORTEZOMIB (bor TEZ oh mib) treats lymphoma. It may also be used to treat multiple myeloma, a type of bone marrow cancer. It works by blocking a protein that causes cancer cells to grow and multiply. This helps to slow or stop the spread of cancer cells. This medicine may be used for other purposes; ask your health care provider or pharmacist if you have questions. COMMON BRAND NAME(S): Velcade What should I tell my care team before I take this medication? They need to know if you have any of these conditions: Dehydration Diabetes Heart disease Liver disease Tingling  of the fingers or toes or other nerve disorder An unusual or allergic reaction to bortezomib, other medications, foods, dyes, or preservatives If you or your partner are pregnant or trying to get pregnant Breastfeeding How should I use this medication? This medication is injected into a vein or under the  skin. It is given by your care team in a hospital or clinic setting. Talk to your care team about the use of this medication in children. Special care may be needed. Overdosage: If you think you have taken too much of this medicine contact a poison control center or emergency room at once. NOTE: This medicine is only for you. Do not share this medicine with others. What if I miss a dose? Keep appointments for follow-up doses. It is important not to miss your dose. Call your care team if you are unable to keep an appointment. What may interact with this medication? Ketoconazole Rifampin This list may not describe all possible interactions. Give your health care provider a list of all the medicines, herbs, non-prescription drugs, or dietary supplements you use. Also tell them if you smoke, drink alcohol, or use illegal drugs. Some items may interact with your medicine. What should I watch for while using this medication? Your condition will be monitored carefully while you are receiving this medication. You may need blood work while taking this medication. This medication may affect your coordination, reaction time, or judgment. Do not drive or operate machinery until you know how this medication affects you. Sit up or stand slowly to reduce the risk of dizzy or fainting spells. Drinking alcohol with this medication can increase the risk of these side effects. This medication may increase your risk of getting an infection. Call your care team for advice if you get a fever, chills, sore throat, or other symptoms of a cold or flu. Do not treat yourself. Try to avoid being around people who are sick. Check with your care team if you have severe diarrhea, nausea, and vomiting, or if you sweat a lot. The loss of too much body fluid may make it dangerous for you to take this medication. Talk to your care team if you may be pregnant. Serious birth defects can occur if you take this medication during pregnancy  and for 7 months after the last dose. You will need a negative pregnancy test before starting this medication. Contraception is recommended while taking this medication and for 7 months after the last dose. Your care team can help you find the option that works for you. If your partner can get pregnant, use a condom during sex while taking this medication and for 4 months after the last dose. Do not breastfeed while taking this medication and for 2 months after the last dose. This medication may cause infertility. Talk to your care team if you are concerned about your fertility. What side effects may I notice from receiving this medication? Side effects that you should report to your care team as soon as possible: Allergic reactions--skin rash, itching, hives, swelling of the face, lips, tongue, or throat Bleeding--bloody or black, tar-like stools, vomiting blood or brown material that looks like coffee grounds, red or dark brown urine, small red or purple spots on skin, unusual bruising or bleeding Bleeding in the brain--severe headache, stiff neck, confusion, dizziness, change in vision, numbness or weakness of the face, arm, or leg, trouble speaking, trouble walking, vomiting Bowel blockage--stomach cramping, unable to have a bowel movement or pass gas, loss of  appetite, vomiting Heart failure--shortness of breath, swelling of the ankles, feet, or hands, sudden weight gain, unusual weakness or fatigue Infection--fever, chills, cough, sore throat, wounds that don't heal, pain or trouble when passing urine, general feeling of discomfort or being unwell Liver injury--right upper belly pain, loss of appetite, nausea, light-colored stool, dark yellow or brown urine, yellowing skin or eyes, unusual weakness or fatigue Low blood pressure--dizziness, feeling faint or lightheaded, blurry vision Lung injury--shortness of breath or trouble breathing, cough, spitting up blood, chest pain, fever Pain, tingling,  or numbness in the hands or feet Severe or prolonged diarrhea Stomach pain, bloody diarrhea, pale skin, unusual weakness or fatigue, decrease in the amount of urine, which may be signs of hemolytic uremic syndrome Sudden and severe headache, confusion, change in vision, seizures, which may be signs of posterior reversible encephalopathy syndrome (PRES) TTP--purple spots on the skin or inside the mouth, pale skin, yellowing skin or eyes, unusual weakness or fatigue, fever, fast or irregular heartbeat, confusion, change in vision, trouble speaking, trouble walking Tumor lysis syndrome (TLS)--nausea, vomiting, diarrhea, decrease in the amount of urine, dark urine, unusual weakness or fatigue, confusion, muscle pain or cramps, fast or irregular heartbeat, joint pain Side effects that usually do not require medical attention (report to your care team if they continue or are bothersome): Constipation Diarrhea Fatigue Loss of appetite Nausea This list may not describe all possible side effects. Call your doctor for medical advice about side effects. You may report side effects to FDA at 1-800-FDA-1088. Where should I keep my medication? This medication is given in a hospital or clinic. It will not be stored at home. NOTE: This sheet is a summary. It may not cover all possible information. If you have questions about this medicine, talk to your doctor, pharmacist, or health care provider.  2024 Elsevier/Gold Standard (2022-01-06 00:00:00)  Daratumumab; Hyaluronidase Injection What is this medication? DARATUMUMAB; HYALURONIDASE (dar a toom ue mab; hye al ur ON i dase) treats multiple myeloma, a type of bone marrow cancer. Daratumumab works by blocking a protein that causes cancer cells to grow and multiply. This helps to slow or stop the spread of cancer cells. Hyaluronidase works by increasing the absorption of other medications in the body to help them work better. This medication may also be used  treat amyloidosis, a condition that causes the buildup of a protein (amyloid) in your body. It works by reducing the buildup of this protein, which decreases symptoms. It is a combination medication that contains a monoclonal antibody. This medicine may be used for other purposes; ask your health care provider or pharmacist if you have questions. COMMON BRAND NAME(S): DARZALEX FASPRO What should I tell my care team before I take this medication? They need to know if you have any of these conditions: Heart disease Infection, such as chickenpox, cold sores, herpes, hepatitis B Lung or breathing disease An unusual or allergic reaction to daratumumab, hyaluronidase, other medications, foods, dyes, or preservatives Pregnant or trying to get pregnant Breast-feeding How should I use this medication? This medication is injected under the skin. It is given by your care team in a hospital or clinic setting. Talk to your care team about the use of this medication in children. Special care may be needed. Overdosage: If you think you have taken too much of this medicine contact a poison control center or emergency room at once. NOTE: This medicine is only for you. Do not share this medicine with others. What if I  miss a dose? Keep appointments for follow-up doses. It is important not to miss your dose. Call your care team if you are unable to keep an appointment. What may interact with this medication? Interactions have not been studied. This list may not describe all possible interactions. Give your health care provider a list of all the medicines, herbs, non-prescription drugs, or dietary supplements you use. Also tell them if you smoke, drink alcohol, or use illegal drugs. Some items may interact with your medicine. What should I watch for while using this medication? Your condition will be monitored carefully while you are receiving this medication. This medication can cause serious allergic reactions.  To reduce your risk, your care team may give you other medication to take before receiving this one. Be sure to follow the directions from your care team. This medication can affect the results of blood tests to match your blood type. These changes can last for up to 6 months after the final dose. Your care team will do blood tests to match your blood type before you start treatment. Tell all of your care team that you are being treated with this medication before receiving a blood transfusion. This medication can affect the results of some tests used to determine treatment response; extra tests may be needed to evaluate response. Talk to your care team if you wish to become pregnant or think you are pregnant. This medication can cause serious birth defects if taken during pregnancy and for 3 months after the last dose. A reliable form of contraception is recommended while taking this medication and for 3 months after the last dose. Talk to your care team about effective forms of contraception. Do not breast-feed while taking this medication. What side effects may I notice from receiving this medication? Side effects that you should report to your care team as soon as possible: Allergic reactions--skin rash, itching, hives, swelling of the face, lips, tongue, or throat Heart rhythm changes--fast or irregular heartbeat, dizziness, feeling faint or lightheaded, chest pain, trouble breathing Infection--fever, chills, cough, sore throat, wounds that don't heal, pain or trouble when passing urine, general feeling of discomfort or being unwell Infusion reactions--chest pain, shortness of breath or trouble breathing, feeling faint or lightheaded Sudden eye pain or change in vision such as blurry vision, seeing halos around lights, vision loss Unusual bruising or bleeding Side effects that usually do not require medical attention (report to your care team if they continue or are  bothersome): Constipation Diarrhea Fatigue Nausea Pain, tingling, or numbness in the hands or feet Swelling of the ankles, hands, or feet This list may not describe all possible side effects. Call your doctor for medical advice about side effects. You may report side effects to FDA at 1-800-FDA-1088. Where should I keep my medication? This medication is given in a hospital or clinic. It will not be stored at home. NOTE: This sheet is a summary. It may not cover all possible information. If you have questions about this medicine, talk to your doctor, pharmacist, or health care provider.  2024 Elsevier/Gold Standard (2021-12-09 00:00:00)

## 2023-04-09 ENCOUNTER — Inpatient Hospital Stay: Payer: Medicare Other

## 2023-04-12 ENCOUNTER — Telehealth: Payer: Self-pay | Admitting: *Deleted

## 2023-04-12 MED ORDER — HYDROCORTISONE 2.5 % EX CREA: TOPICAL_CREAM | Freq: Two times a day (BID) | CUTANEOUS | 1 refills | Status: DC

## 2023-04-12 MED ORDER — PREDNISONE 20 MG PO TABS: ORAL_TABLET | ORAL | 0 refills | Status: DC

## 2023-04-12 NOTE — Telephone Encounter (Signed)
Informed Jeffery Higgins that xray shows healing fracture at the site of his pain and if it worsens, we can order more imaging to look for any active myeloma. OK to refill the hydrocortisone cream and continue Prednisone 20 mg every day x 5 days,then back to 40 mg on MWF.  Continue to hold Revlimid and f/u as scheduled.

## 2023-04-12 NOTE — Telephone Encounter (Signed)
Jeffery Higgins called to report his rash seems to be spreading more: back, shoulders, chest, abdomen, elbows and knees. Pruritus continues despite prednisone 20 mg daily and Hydrocortisone 2.5% cream. Has been off Revlimid since 04/08/23. Requests refill on prednisone and hydrocortisone cream and asking for pelvis xray results.

## 2023-04-14 DIAGNOSIS — E785 Hyperlipidemia, unspecified: Secondary | ICD-10-CM | POA: Insufficient documentation

## 2023-04-18 ENCOUNTER — Other Ambulatory Visit: Payer: Self-pay | Admitting: Oncology

## 2023-04-20 ENCOUNTER — Other Ambulatory Visit: Payer: Self-pay | Admitting: Oncology

## 2023-04-23 ENCOUNTER — Other Ambulatory Visit: Payer: Self-pay

## 2023-04-23 ENCOUNTER — Inpatient Hospital Stay (HOSPITAL_BASED_OUTPATIENT_CLINIC_OR_DEPARTMENT_OTHER): Payer: Medicare Other | Admitting: Nurse Practitioner

## 2023-04-23 ENCOUNTER — Inpatient Hospital Stay: Payer: Medicare Other | Attending: Nurse Practitioner

## 2023-04-23 ENCOUNTER — Encounter: Payer: Self-pay | Admitting: Nurse Practitioner

## 2023-04-23 ENCOUNTER — Inpatient Hospital Stay: Payer: Medicare Other

## 2023-04-23 DIAGNOSIS — M25512 Pain in left shoulder: Secondary | ICD-10-CM | POA: Diagnosis not present

## 2023-04-23 DIAGNOSIS — E1140 Type 2 diabetes mellitus with diabetic neuropathy, unspecified: Secondary | ICD-10-CM | POA: Insufficient documentation

## 2023-04-23 DIAGNOSIS — J45909 Unspecified asthma, uncomplicated: Secondary | ICD-10-CM | POA: Diagnosis not present

## 2023-04-23 DIAGNOSIS — Z8616 Personal history of COVID-19: Secondary | ICD-10-CM | POA: Insufficient documentation

## 2023-04-23 DIAGNOSIS — R21 Rash and other nonspecific skin eruption: Secondary | ICD-10-CM | POA: Insufficient documentation

## 2023-04-23 DIAGNOSIS — G4730 Sleep apnea, unspecified: Secondary | ICD-10-CM | POA: Insufficient documentation

## 2023-04-23 DIAGNOSIS — C9000 Multiple myeloma not having achieved remission: Secondary | ICD-10-CM

## 2023-04-23 DIAGNOSIS — Z87440 Personal history of urinary (tract) infections: Secondary | ICD-10-CM | POA: Insufficient documentation

## 2023-04-23 DIAGNOSIS — Z5112 Encounter for antineoplastic immunotherapy: Secondary | ICD-10-CM | POA: Insufficient documentation

## 2023-04-23 DIAGNOSIS — I10 Essential (primary) hypertension: Secondary | ICD-10-CM | POA: Insufficient documentation

## 2023-04-23 LAB — CBC WITH DIFFERENTIAL (CANCER CENTER ONLY)
Abs Immature Granulocytes: 0.01 10*3/uL (ref 0.00–0.07)
Basophils Absolute: 0.1 10*3/uL (ref 0.0–0.1)
Basophils Relative: 1 %
Eosinophils Absolute: 0.1 10*3/uL (ref 0.0–0.5)
Eosinophils Relative: 2 %
HCT: 42.6 % (ref 39.0–52.0)
Hemoglobin: 14 g/dL (ref 13.0–17.0)
Immature Granulocytes: 0 %
Lymphocytes Relative: 24 %
Lymphs Abs: 1.7 10*3/uL (ref 0.7–4.0)
MCH: 31.7 pg (ref 26.0–34.0)
MCHC: 32.9 g/dL (ref 30.0–36.0)
MCV: 96.6 fL (ref 80.0–100.0)
Monocytes Absolute: 0.6 10*3/uL (ref 0.1–1.0)
Monocytes Relative: 8 %
Neutro Abs: 4.6 10*3/uL (ref 1.7–7.7)
Neutrophils Relative %: 65 %
Platelet Count: 151 10*3/uL (ref 150–400)
RBC: 4.41 MIL/uL (ref 4.22–5.81)
RDW: 16.4 % — ABNORMAL HIGH (ref 11.5–15.5)
WBC Count: 7.1 10*3/uL (ref 4.0–10.5)
nRBC: 0 % (ref 0.0–0.2)

## 2023-04-23 LAB — CMP (CANCER CENTER ONLY)
ALT: 18 U/L (ref 0–44)
AST: 13 U/L — ABNORMAL LOW (ref 15–41)
Albumin: 3.7 g/dL (ref 3.5–5.0)
Alkaline Phosphatase: 44 U/L (ref 38–126)
Anion gap: 5 (ref 5–15)
BUN: 23 mg/dL (ref 8–23)
CO2: 32 mmol/L (ref 22–32)
Calcium: 8.5 mg/dL — ABNORMAL LOW (ref 8.9–10.3)
Chloride: 104 mmol/L (ref 98–111)
Creatinine: 0.95 mg/dL (ref 0.61–1.24)
GFR, Estimated: >60 mL/min (ref >60)
Glucose, Bld: 140 mg/dL — ABNORMAL HIGH (ref 70–99)
Potassium: 4.4 mmol/L (ref 3.5–5.1)
Sodium: 141 mmol/L (ref 135–145)
Total Bilirubin: 0.6 mg/dL (ref 0.3–1.2)
Total Protein: 6.1 g/dL — ABNORMAL LOW (ref 6.5–8.1)

## 2023-04-23 MED ORDER — DARATUMUMAB-HYALURONIDASE-FIHJ 1800-30000 MG-UT/15ML ~~LOC~~ SOLN
1800.0000 mg | Freq: Once | SUBCUTANEOUS | Status: AC
  Administered 2023-04-23: 1800 mg via SUBCUTANEOUS
  Filled 2023-04-23: qty 15

## 2023-04-23 MED ORDER — DEXAMETHASONE 4 MG PO TABS
10.0000 mg | ORAL_TABLET | Freq: Once | ORAL | Status: AC
  Administered 2023-04-23: 10 mg via ORAL
  Filled 2023-04-23: qty 3

## 2023-04-23 MED ORDER — DIPHENHYDRAMINE HCL 25 MG PO CAPS
25.0000 mg | ORAL_CAPSULE | Freq: Once | ORAL | Status: AC
  Administered 2023-04-23: 25 mg via ORAL
  Filled 2023-04-23: qty 1

## 2023-04-23 MED ORDER — ACETAMINOPHEN 325 MG PO TABS
650.0000 mg | ORAL_TABLET | Freq: Once | ORAL | Status: AC
  Administered 2023-04-23: 650 mg via ORAL
  Filled 2023-04-23: qty 2

## 2023-04-23 NOTE — Progress Notes (Signed)
Patient seen by Lonna Cobb NP today  Vitals are within treatment parameters.  Labs reviewed by Lonna Cobb NP and are within treatment parameters.  Per physician team, patient is ready for treatment. Please note that modifications are being made to the treatment plan including no velcade.

## 2023-04-23 NOTE — Progress Notes (Signed)
[facility] OFFICE PROGRESS NOTE   Diagnosis: Multiple myeloma  INTERVAL HISTORY:   Jeffery Higgins returns as scheduled.  He began cycle for daratumumab-RVD 03/26/2023.  When he was here 04/08/2023 he reported recurrence of the skin rash to his nurse.  Note indicates rash was present on his back and low abdomen.  Revlimid was placed on hold.  He thinks the rash was mainly at the low abdomen at previous sites of Velcade injections.  He had itching in various areas but did not note a definite rash anywhere else.  He continues to note pain intermittently at the right lower back after certain activities.  He has increased numbness in his feet, now involving one half of each foot.  No numbness or tingling in the hands.  Objective:  Vital signs in last 24 hours:  Blood pressure (!) 137/58, pulse 67, temperature 98.2 F (36.8 C), temperature source Oral, resp. rate 18, height 5\' 10"  (1.778 m), weight 253 lb 3.2 oz (114.9 kg), SpO2 99%.    HEENT: No thrush or ulcers. Resp: Lungs clear bilaterally. Cardio: Regular rate and rhythm. GI: No hepatosplenomegaly. Vascular: No leg edema. Skin: Several areas of skin hyperpigmentation over the lower abdominal wall, associated dryness.   Lab Results:  Lab Results  Component Value Date   WBC 7.1 04/23/2023   HGB 14.0 04/23/2023   HCT 42.6 04/23/2023   MCV 96.6 04/23/2023   PLT 151 04/23/2023   NEUTROABS 4.6 04/23/2023    Imaging:  No results found.  Medications: I have reviewed the patient's current medications.  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 numerous enhancing lesions of the skull base and calvarium no evidence of extraosseous component 12/17/2022-MRI of the cervical, thoracic, and lumbar spine-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, clivus, bilateral occipital condyles, ribs, sacrum, and iliac bone, no evidence of pathologic fracture, expansile lesion in C3 extends into the spinal canal by 4 mm with moderate to severe spinal canal stenosis posterior to C3, mild right foraminal narrowing at T2-T3 and T3-4 related to osseous tumor at T3 12/17/2022-SPEP-2.6 g serum M spike, IgG kappa Bone marrow biopsy 12/18/2022-multiple myeloma with plasma cells involving 60% of the cellular marrow, kappa restricted myeloma FISH panel-QNS Bone survey 12/18/2022-lucencies at the skull, right scapula, left clavicle, lumbar spine, pelvis, and left femoral neck.  Destructive lesion at C3, possible sclerotic lucent lesions in the thoracic spine 12/18/2022 pelvic radiation to C3 and humeral head-12/31/2022 Cycle 1 daratumumab-VRD 01/01/2023 (Velcade 3 weeks on/1 week off) Cycle 2 daratumumab-VRD 01/29/2023, Revlimid discontinued after 19 days due to a pruritic rash Cycle 3 daratumumab-VRD 02/26/2023, Revlimid dose reduced to 10 mg Cycle 4 daratumumab-VRD 03/26/2023, Revlimid 10 mg x 18 days, Dex changed to prednisone Monday Wednesday Friday due to rash; Revlimid placed on hold 04/08/2023 due to rash at the lower abdominal wall and pruritus Cycle 5 daratumumab/VRD 04/23/2023-patient declines to resume Revlimid due to rash/pruritus; Velcade placed on hold 04/23/2023 due to progressive neuropathy; proceeded with daratumumab and dexamethasone   2.  Left shoulder pain-likely secondary to a lytic lesion at the left humeral head/neck 3.  Left chin/distal mandible numbness-likely secondary to myeloma compressing nerve roots at the brainstem or left face  4.  Hypertension 5.  Diabetes 6.  Sleep apnea 7.  Nephrolithiasis-right ureteral  calculus urinary tract infection, placement of a right ureter stent right stone dislodgment 10/01/2022 8.  Asthma 9.  Report of rash 04/08/2023-location and appearance 04/23/2023 consistent with skin change related to the Velcade injection.  Disposition: Jeffery Higgins has completed 4 cycles of daratumumab/VRD.  Revlimid was discontinued last week due to report of recurrent rash.  It is unclear if the rash, which was mainly located at the lower abdomen, was related to the Velcade injections versus Revlimid.  Appearance today is more suggestive of being related to Velcade injections.  He attributes the rash and pruritus to Revlimid and declines to resume.  He has developed increased numbness in both feet.  We discussed the possibility of neuropathy related to Velcade.  Plan to place Velcade on hold for now.  Plan to proceed with daratumumab, resume weekly dexamethasone today.  He has an appointment with Dr. Melrose Nakayama at [facility] next week.  He will return for follow-up here as scheduled in 1 week.  Patient seen with Dr. Truett Perna.  Lonna Cobb ANP/GNP-BC   04/23/2023  10:55 AM  This was a shared visit with Lonna Cobb.  Jeffery Higgins was interviewed and examined.  He is unable to tolerate lenalidomide secondary to pruritus.  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  He will complete another treatment with daratumumab/Decadron today.  He is scheduled for follow-up with Dr. Melrose Nakayama next week.  I explained the rationale for continuing a multi agent regimen until he achieves a plateau in the M protein.  I was present for greater than 50% of today's visit.  I performed Medical Decision Making.  Mancel Bale, MD

## 2023-04-23 NOTE — Patient Instructions (Signed)
[facility]   Discharge Instructions: Thank you for choosing [facility] to provide your oncology and hematology care.   If you have a lab appointment with the Cancer Center, please go directly to the Cancer Center and check in at the registration area.   Wear comfortable clothing and clothing appropriate for easy access to any Portacath or PICC line.   We strive to give you quality time with your provider. You may need to reschedule your appointment if you arrive late (15 or more minutes).  Arriving late affects you and other patients whose appointments are after yours.  Also, if you miss three or more appointments without notifying the office, you may be dismissed from the clinic at the provider's discretion.      For prescription refill requests, have your pharmacy contact our office and allow 72 hours for refills to be completed.    Today you received the following chemotherapy and/or immunotherapy agents Darzalex-Faspro      To help prevent nausea and vomiting after your treatment, we encourage you to take your nausea medication as directed.  BELOW ARE SYMPTOMS THAT SHOULD BE REPORTED IMMEDIATELY: *FEVER GREATER THAN 100.4 F (38 C) OR HIGHER *CHILLS OR SWEATING *NAUSEA AND VOMITING THAT IS NOT CONTROLLED WITH YOUR NAUSEA MEDICATION *UNUSUAL SHORTNESS OF BREATH *UNUSUAL BRUISING OR BLEEDING *URINARY PROBLEMS (pain or burning when urinating, or frequent urination) *BOWEL PROBLEMS (unusual diarrhea, constipation, pain near the anus) TENDERNESS IN MOUTH AND THROAT WITH OR WITHOUT PRESENCE OF ULCERS (sore throat, sores in mouth, or a toothache) UNUSUAL RASH, SWELLING OR PAIN  UNUSUAL VAGINAL DISCHARGE OR ITCHING   Items with * indicate a potential emergency and should be followed up as soon as possible or go to the Emergency Department if any problems should occur.  Please show the CHEMOTHERAPY ALERT CARD or IMMUNOTHERAPY ALERT CARD at  check-in to the Emergency Department and triage nurse.  Should you have questions after your visit or need to cancel or reschedule your appointment, please contact [facility]  Dept: [phone]  and follow the prompts.  Office hours are 8:00 a.m. to 4:30 p.m. Monday - Friday. Please note that voicemails left after 4:00 p.m. may not be returned until the following business day.  We are closed weekends and major holidays. You have access to a nurse at all times for urgent questions. Please call the main number to the clinic Dept: [phone] and follow the prompts.   For any non-urgent questions, you may also contact your provider using MyChart. We now offer e-Visits for anyone 18 and older to request care online for non-urgent symptoms. For details visit [email or web address].   Also download the MyChart app! Go to the app store, search "MyChart", open the app, select , and log in with your MyChart username and password.  Daratumumab; Hyaluronidase Injection What is this medication? DARATUMUMAB; HYALURONIDASE (dar a toom ue mab; hye al ur ON i dase) treats multiple myeloma, a type of bone marrow cancer. Daratumumab works by blocking a protein that causes cancer cells to grow and multiply. This helps to slow or stop the spread of cancer cells. Hyaluronidase works by increasing the absorption of other medications in the body to help them work better. This medication may also be used treat amyloidosis, a condition that causes the buildup of a protein (amyloid) in your body. It works by reducing the buildup of this protein, which decreases symptoms. It is a   combination medication that contains a monoclonal antibody. This medicine may be used for other purposes; ask your health care provider or pharmacist if you have questions. COMMON BRAND NAME(S): DARZALEX FASPRO What should I tell my care team before I take this medication? They need to know if you have  any of these conditions: Heart disease Infection, such as chickenpox, cold sores, herpes, hepatitis B Lung or breathing disease An unusual or allergic reaction to daratumumab, hyaluronidase, other medications, foods, dyes, or preservatives Pregnant or trying to get pregnant Breast-feeding How should I use this medication? This medication is injected under the skin. It is given by your care team in a hospital or clinic setting. Talk to your care team about the use of this medication in children. Special care may be needed. Overdosage: If you think you have taken too much of this medicine contact a poison control center or emergency room at once. NOTE: This medicine is only for you. Do not share this medicine with others. What if I miss a dose? Keep appointments for follow-up doses. It is important not to miss your dose. Call your care team if you are unable to keep an appointment. What may interact with this medication? Interactions have not been studied. This list may not describe all possible interactions. Give your health care provider a list of all the medicines, herbs, non-prescription drugs, or dietary supplements you use. Also tell them if you smoke, drink alcohol, or use illegal drugs. Some items may interact with your medicine. What should I watch for while using this medication? Your condition will be monitored carefully while you are receiving this medication. This medication can cause serious allergic reactions. To reduce your risk, your care team may give you other medication to take before receiving this one. Be sure to follow the directions from your care team. This medication can affect the results of blood tests to match your blood type. These changes can last for up to 6 months after the final dose. Your care team will do blood tests to match your blood type before you start treatment. Tell all of your care team that you are being treated with this medication before receiving a  blood transfusion. This medication can affect the results of some tests used to determine treatment response; extra tests may be needed to evaluate response. Talk to your care team if you wish to become pregnant or think you are pregnant. This medication can cause serious birth defects if taken during pregnancy and for 3 months after the last dose. A reliable form of contraception is recommended while taking this medication and for 3 months after the last dose. Talk to your care team about effective forms of contraception. Do not breast-feed while taking this medication. What side effects may I notice from receiving this medication? Side effects that you should report to your care team as soon as possible: Allergic reactions--skin rash, itching, hives, swelling of the face, lips, tongue, or throat Heart rhythm changes--fast or irregular heartbeat, dizziness, feeling faint or lightheaded, chest pain, trouble breathing Infection--fever, chills, cough, sore throat, wounds that don't heal, pain or trouble when passing urine, general feeling of discomfort or being unwell Infusion reactions--chest pain, shortness of breath or trouble breathing, feeling faint or lightheaded Sudden eye pain or change in vision such as blurry vision, seeing halos around lights, vision loss Unusual bruising or bleeding Side effects that usually do not require medical attention (report to your care team if they continue or are bothersome): Constipation   Diarrhea Fatigue Nausea Pain, tingling, or numbness in the hands or feet Swelling of the ankles, hands, or feet This list may not describe all possible side effects. Call your doctor for medical advice about side effects. You may report side effects to FDA at 1-800-FDA-1088. Where should I keep my medication? This medication is given in a hospital or clinic. It will not be stored at home. NOTE: This sheet is a summary. It may not cover all possible information. If you have  questions about this medicine, talk to your doctor, pharmacist, or health care provider.  2024 Elsevier/Gold Standard (2021-12-09 00:00:00)  

## 2023-04-28 ENCOUNTER — Telehealth: Payer: Self-pay | Admitting: *Deleted

## 2023-04-28 NOTE — Telephone Encounter (Signed)
Jeffery Higgins called to report he tested positive for COVID on 04/27/23. Has some sinus congestion, cough, mild SOB and body aches. No fever. Called his PCP and he has started Paxovid. Has appointment at [facility] and asking if she should come or reschedule? Asking if MD has discussed his treatment plan with Dr. Melrose Nakayama yet?

## 2023-04-30 ENCOUNTER — Inpatient Hospital Stay: Payer: Medicare Other

## 2023-04-30 ENCOUNTER — Inpatient Hospital Stay: Payer: Medicare Other | Admitting: Oncology

## 2023-04-30 ENCOUNTER — Telehealth: Payer: Self-pay | Admitting: *Deleted

## 2023-04-30 NOTE — Telephone Encounter (Signed)
Dr. Truett Perna attempted to reach patient to discuss what Dr. Melrose Nakayama recommended. LVM for patient to check his Mychart message and let me know if he agrees to proceed with this plan so the new dose of Revlimid can be ordered. Revlimid at reduced dose of 5 mg daily for 21 days on, then 7 rest (start on 9/20) Pepcid 20 mg daily at home Weekly decadron on day of treatment and dose at home on week he is not treated. Continue daratumumab (Darzalex) Zometa every 3 months Continue aspirin 81 mg daily and acyclovir 500 mg daily. Let me know if you agree so I can re-order your Revlimid and order your Pepcid.

## 2023-05-03 ENCOUNTER — Other Ambulatory Visit: Payer: Self-pay | Admitting: *Deleted

## 2023-05-03 MED ORDER — LENALIDOMIDE 5 MG PO CAPS
5.0000 mg | ORAL_CAPSULE | Freq: Every day | ORAL | 0 refills | Status: DC
Start: 2023-05-03 — End: 2023-05-31

## 2023-05-03 MED ORDER — FAMOTIDINE 20 MG PO TABS: 20.0000 mg | ORAL_TABLET | Freq: Every day | ORAL | 0 refills | Status: AC

## 2023-05-03 MED ORDER — DEXAMETHASONE 2 MG PO TABS: 10.0000 mg | ORAL_TABLET | ORAL | 0 refills | Status: DC

## 2023-05-04 ENCOUNTER — Other Ambulatory Visit: Payer: Self-pay | Admitting: Oncology

## 2023-05-04 DIAGNOSIS — C9000 Multiple myeloma not having achieved remission: Secondary | ICD-10-CM

## 2023-05-05 ENCOUNTER — Other Ambulatory Visit: Payer: Self-pay | Admitting: Nurse Practitioner

## 2023-05-07 ENCOUNTER — Inpatient Hospital Stay: Payer: Medicare Other

## 2023-05-07 ENCOUNTER — Inpatient Hospital Stay (HOSPITAL_BASED_OUTPATIENT_CLINIC_OR_DEPARTMENT_OTHER): Payer: Medicare Other | Admitting: Nurse Practitioner

## 2023-05-07 ENCOUNTER — Encounter: Payer: Self-pay | Admitting: Nurse Practitioner

## 2023-05-07 ENCOUNTER — Other Ambulatory Visit: Payer: Self-pay

## 2023-05-07 VITALS — BP 132/65 | HR 71 | Temp 98.0°F | Resp 18 | Ht 70.0 in | Wt 255.0 lb

## 2023-05-07 DIAGNOSIS — C9000 Multiple myeloma not having achieved remission: Secondary | ICD-10-CM

## 2023-05-07 LAB — CMP (CANCER CENTER ONLY)
ALT: 18 U/L (ref 0–44)
AST: 16 U/L (ref 15–41)
Albumin: 3.8 g/dL (ref 3.5–5.0)
Alkaline Phosphatase: 61 U/L (ref 38–126)
Anion gap: 6 (ref 5–15)
BUN: 20 mg/dL (ref 8–23)
CO2: 27 mmol/L (ref 22–32)
Calcium: 8.6 mg/dL — ABNORMAL LOW (ref 8.9–10.3)
Chloride: 106 mmol/L (ref 98–111)
Creatinine: 0.86 mg/dL (ref 0.61–1.24)
GFR, Estimated: >60 mL/min (ref >60)
Glucose, Bld: 140 mg/dL — ABNORMAL HIGH (ref 70–99)
Potassium: 4.2 mmol/L (ref 3.5–5.1)
Sodium: 139 mmol/L (ref 135–145)
Total Bilirubin: 0.4 mg/dL (ref 0.3–1.2)
Total Protein: 6.2 g/dL — ABNORMAL LOW (ref 6.5–8.1)

## 2023-05-07 LAB — CBC WITH DIFFERENTIAL (CANCER CENTER ONLY)
Abs Immature Granulocytes: 0.02 10*3/uL (ref 0.00–0.07)
Basophils Absolute: 0 10*3/uL (ref 0.0–0.1)
Basophils Relative: 0 %
Eosinophils Absolute: 0.3 10*3/uL (ref 0.0–0.5)
Eosinophils Relative: 3 %
HCT: 42.2 % (ref 39.0–52.0)
Hemoglobin: 13.9 g/dL (ref 13.0–17.0)
Immature Granulocytes: 0 %
Lymphocytes Relative: 14 %
Lymphs Abs: 1.1 10*3/uL (ref 0.7–4.0)
MCH: 31.4 pg (ref 26.0–34.0)
MCHC: 32.9 g/dL (ref 30.0–36.0)
MCV: 95.3 fL (ref 80.0–100.0)
Monocytes Absolute: 0.5 10*3/uL (ref 0.1–1.0)
Monocytes Relative: 7 %
Neutro Abs: 5.9 10*3/uL (ref 1.7–7.7)
Neutrophils Relative %: 76 %
Platelet Count: 147 10*3/uL — ABNORMAL LOW (ref 150–400)
RBC: 4.43 MIL/uL (ref 4.22–5.81)
RDW: 14.8 % (ref 11.5–15.5)
WBC Count: 7.8 10*3/uL (ref 4.0–10.5)
nRBC: 0 % (ref 0.0–0.2)

## 2023-05-07 MED ORDER — DARATUMUMAB-HYALURONIDASE-FIHJ 1800-30000 MG-UT/15ML ~~LOC~~ SOLN
1800.0000 mg | Freq: Once | SUBCUTANEOUS | Status: AC
  Administered 2023-05-07: 1800 mg via SUBCUTANEOUS
  Filled 2023-05-07: qty 15

## 2023-05-07 MED ORDER — ACETAMINOPHEN 325 MG PO TABS
650.0000 mg | ORAL_TABLET | Freq: Once | ORAL | Status: AC
  Administered 2023-05-07: 650 mg via ORAL
  Filled 2023-05-07: qty 2

## 2023-05-07 MED ORDER — DIPHENHYDRAMINE HCL 25 MG PO CAPS
25.0000 mg | ORAL_CAPSULE | Freq: Once | ORAL | Status: AC
  Administered 2023-05-07: 25 mg via ORAL
  Filled 2023-05-07: qty 1

## 2023-05-07 MED ORDER — DEXAMETHASONE 4 MG PO TABS
10.0000 mg | ORAL_TABLET | Freq: Once | ORAL | Status: AC
  Administered 2023-05-07: 10 mg via ORAL
  Filled 2023-05-07: qty 3

## 2023-05-07 NOTE — Progress Notes (Signed)
Patient seen by Lonna Cobb NP today  Vitals are within treatment parameters:Yes   Labs are within treatment parameters: Yes   Treatment plan has been signed: Yes   Per physician team, Patient is ready for treatment. Please note the following modifications:no Velcade only daratumumab

## 2023-05-07 NOTE — Patient Instructions (Signed)
[facility]  Discharge Instructions: Thank you for choosing [facility] to provide your oncology and hematology care.   If you have a lab appointment with the Cancer Center, please go directly to the Cancer Center and check in at the registration area.   Wear comfortable clothing and clothing appropriate for easy access to any Portacath or PICC line.   We strive to give you quality time with your provider. You may need to reschedule your appointment if you arrive late (15 or more minutes).  Arriving late affects you and other patients whose appointments are after yours.  Also, if you miss three or more appointments without notifying the office, you may be dismissed from the clinic at the provider's discretion.      For prescription refill requests, have your pharmacy contact our office and allow 72 hours for refills to be completed.    Today you received the following chemotherapy and/or immunotherapy agents Darzalex-Faspro.      To help prevent nausea and vomiting after your treatment, we encourage you to take your nausea medication as directed.  BELOW ARE SYMPTOMS THAT SHOULD BE REPORTED IMMEDIATELY: *FEVER GREATER THAN 100.4 F (38 C) OR HIGHER *CHILLS OR SWEATING *NAUSEA AND VOMITING THAT IS NOT CONTROLLED WITH YOUR NAUSEA MEDICATION *UNUSUAL SHORTNESS OF BREATH *UNUSUAL BRUISING OR BLEEDING *URINARY PROBLEMS (pain or burning when urinating, or frequent urination) *BOWEL PROBLEMS (unusual diarrhea, constipation, pain near the anus) TENDERNESS IN MOUTH AND THROAT WITH OR WITHOUT PRESENCE OF ULCERS (sore throat, sores in mouth, or a toothache) UNUSUAL RASH, SWELLING OR PAIN  UNUSUAL VAGINAL DISCHARGE OR ITCHING   Items with * indicate a potential emergency and should be followed up as soon as possible or go to the Emergency Department if any problems should occur.  Please show the CHEMOTHERAPY ALERT CARD or IMMUNOTHERAPY ALERT CARD at  check-in to the Emergency Department and triage nurse.  Should you have questions after your visit or need to cancel or reschedule your appointment, please contact [facility]  Dept: [phone]  and follow the prompts.  Office hours are 8:00 a.m. to 4:30 p.m. Monday - Friday. Please note that voicemails left after 4:00 p.m. may not be returned until the following business day.  We are closed weekends and major holidays. You have access to a nurse at all times for urgent questions. Please call the main number to the clinic Dept: [phone] and follow the prompts.   For any non-urgent questions, you may also contact your provider using MyChart. We now offer e-Visits for anyone 30 and older to request care online for non-urgent symptoms. For details visit [email or web address].   Also download the MyChart app! Go to the app store, search "MyChart", open the app, select [facility], and log in with your MyChart username and password.

## 2023-05-07 NOTE — Progress Notes (Addendum)
[facility] OFFICE PROGRESS NOTE   Diagnosis: Multiple myeloma  INTERVAL HISTORY:   Jeffery Higgins returns for follow-up.  He completed a treatment with daratumumab 04/23/2023.  He canceled his appointment 04/30/2023 due to COVID.  He reports COVID symptoms were "flulike".  He had a headache, cough and congestion.  He completed a course of Paxlovid and is feeling much better.  He has noted areas of tenderness along the upper and lower left eyelid.  Initially had a "water blister" type lesion at the medial lower lid.  The lesion pressured.  Objective:  Vital signs in last 24 hours:  Blood pressure 132/65, pulse 71, temperature 98 F (36.7 C), temperature source Temporal, resp. rate 18, height 5\' 10"  (1.778 m), weight 255 lb (115.7 kg), SpO2 98%.    HEENT: No thrush or ulcers.  Stye appearing lesions left upper outer eyelid and left lower inner eyelid. Resp: Lungs clear bilaterally. Cardio: Regular rate and rhythm. GI: No hepatosplenomegaly. Vascular: No leg edema. Skin: Skin hyperpigmentation at the lower abdomen.   Lab Results:  Lab Results  Component Value Date   WBC 7.8 05/07/2023   HGB 13.9 05/07/2023   HCT 42.2 05/07/2023   MCV 95.3 05/07/2023   PLT 147 (L) 05/07/2023   NEUTROABS 5.9 05/07/2023    Imaging:  No results found.  Medications: I have reviewed the patient's current medications.  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 numerous enhancing lesions of the skull base and calvarium no evidence of extraosseous component 12/17/2022-MRI of the  cervical, thoracic, and lumbar spine-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, clivus, bilateral occipital condyles, ribs, sacrum, and iliac bone, no evidence of pathologic fracture, expansile lesion in C3 extends into the spinal canal by 4 mm with moderate to severe spinal canal stenosis posterior to C3, mild right foraminal narrowing at T2-T3 and T3-4 related to osseous tumor at T3 12/17/2022-SPEP-2.6 g serum M spike, IgG kappa Bone marrow biopsy 12/18/2022-multiple myeloma with plasma cells involving 60% of the cellular marrow, kappa restricted myeloma FISH panel-QNS Bone survey 12/18/2022-lucencies at the skull, right scapula, left clavicle, lumbar spine, pelvis, and left femoral neck.  Destructive lesion at C3, possible sclerotic lucent lesions in the thoracic spine 12/18/2022 pelvic radiation to C3 and humeral head-12/31/2022 Cycle 1 daratumumab-VRD 01/01/2023 (Velcade 3 weeks on/1 week off) Cycle 2 daratumumab-VRD 01/29/2023, Revlimid discontinued after 19 days due to a pruritic rash Cycle 3 daratumumab-VRD 02/26/2023, Revlimid dose reduced to 10 mg Cycle 4 daratumumab-VRD 03/26/2023, Revlimid 10 mg x 18 days, Dex changed to prednisone Monday Wednesday Friday due to rash; Revlimid placed on hold 04/08/2023 due to rash at the lower abdominal wall and pruritus Cycle 5 daratumumab/VRD 04/23/2023-patient declines to resume Revlimid due to rash/pruritus; Velcade placed on hold 04/23/2023 due to progressive neuropathy; proceeded with daratumumab and dexamethasone 04/28/2023 appointment with Dr. Melrose Nakayama [facility]-recommend continuing daratumumab and dexamethasone.  Would attempt to resume lenalidomide 5 mg days 1 through 21 with addition of daily famotidine and as needed topical steroids; escalate back to 10 mg if tolerated; dexamethasone 20 mg p.o. weekly.  Cycle length 28 days. Cycle 6 Revlimid/dexamethasone plus daratumumab (Revlimid 5 mg days 1 through 21, 28-day cycle; dexamethasone 20 mg weekly)  2.  Left  shoulder pain-likely secondary to a lytic lesion at the left humeral head/neck 3.  Left chin/distal mandible numbness-likely secondary to myeloma compressing nerve roots at the brainstem or left face 4.  Hypertension 5.  Diabetes 6.  Sleep apnea 7.  Nephrolithiasis-right ureteral calculus urinary tract infection, placement of a right ureter stent right stone dislodgment 10/01/2022 8.  Asthma 9.  Report of rash 04/08/2023-location and appearance 04/23/2023 consistent with skin change related to the Velcade injection.  Disposition: Jeffery Higgins appears stable.  He has completed 5 cycles of daratumumab/VRD with evidence of a response.  Velcade has been discontinued due to neuropathy.  He agrees to resume Revlimid as noted above, will take Pepcid 20 mg daily and try topical steroids if needed for pruritus/rash.  Dexamethasone 20 mg weekly.  Continue daratumumab per standard schedule.  He will begin cycle 6 today.  CBC and chemistry panel reviewed.  Labs adequate to proceed as above.  Refer to ophthalmology for evaluation of persistent stye appearing eyelid lesions.   He will return for follow-up and treatment in 2 weeks.  He will contact the office in the interim with any problems.  We specifically discussed recurrent pruritus/rash.    Lonna Cobb ANP/GNP-BC   05/07/2023  11:04 AM

## 2023-05-08 ENCOUNTER — Other Ambulatory Visit: Payer: Self-pay

## 2023-05-10 ENCOUNTER — Telehealth: Payer: Self-pay

## 2023-05-10 NOTE — Telephone Encounter (Signed)
Faxed over a referral to Dr Dione Booze at [phone].

## 2023-05-11 ENCOUNTER — Telehealth: Payer: Self-pay | Admitting: *Deleted

## 2023-05-11 NOTE — Telephone Encounter (Signed)
Jeffery Higgins called reporting Dr. Laruth Bouchard office has not gotten his referral and he is asking if Dr. Truett Perna will order something for his stye while he awaits an appointment? This RN called Dr. Laruth Bouchard office at [phone] and confirmed they have the referral and he will be seen in 2-5 days.  Confirmed w/[facility] that they do accept walk-ins. Notified Jeffery Higgins and provided phone/address. He plans on going today.

## 2023-05-14 ENCOUNTER — Other Ambulatory Visit: Payer: Self-pay

## 2023-05-21 ENCOUNTER — Inpatient Hospital Stay: Payer: Medicare Other

## 2023-05-21 ENCOUNTER — Inpatient Hospital Stay: Payer: Medicare Other | Attending: Nurse Practitioner

## 2023-05-21 ENCOUNTER — Inpatient Hospital Stay (HOSPITAL_BASED_OUTPATIENT_CLINIC_OR_DEPARTMENT_OTHER): Payer: Medicare Other | Admitting: Nurse Practitioner

## 2023-05-21 ENCOUNTER — Encounter: Payer: Self-pay | Admitting: Nurse Practitioner

## 2023-05-21 VITALS — BP 137/65 | HR 62 | Temp 98.0°F | Resp 18

## 2023-05-21 VITALS — BP 137/64 | HR 63 | Temp 97.7°F | Resp 18 | Ht 70.0 in | Wt 257.5 lb

## 2023-05-21 DIAGNOSIS — Z87440 Personal history of urinary (tract) infections: Secondary | ICD-10-CM | POA: Insufficient documentation

## 2023-05-21 DIAGNOSIS — C9000 Multiple myeloma not having achieved remission: Secondary | ICD-10-CM

## 2023-05-21 DIAGNOSIS — E1140 Type 2 diabetes mellitus with diabetic neuropathy, unspecified: Secondary | ICD-10-CM | POA: Insufficient documentation

## 2023-05-21 DIAGNOSIS — Z5112 Encounter for antineoplastic immunotherapy: Secondary | ICD-10-CM | POA: Diagnosis not present

## 2023-05-21 DIAGNOSIS — Z23 Encounter for immunization: Secondary | ICD-10-CM | POA: Diagnosis not present

## 2023-05-21 DIAGNOSIS — R21 Rash and other nonspecific skin eruption: Secondary | ICD-10-CM | POA: Diagnosis not present

## 2023-05-21 DIAGNOSIS — K5900 Constipation, unspecified: Secondary | ICD-10-CM | POA: Diagnosis not present

## 2023-05-21 DIAGNOSIS — M25512 Pain in left shoulder: Secondary | ICD-10-CM | POA: Diagnosis not present

## 2023-05-21 DIAGNOSIS — I10 Essential (primary) hypertension: Secondary | ICD-10-CM | POA: Insufficient documentation

## 2023-05-21 DIAGNOSIS — N200 Calculus of kidney: Secondary | ICD-10-CM | POA: Insufficient documentation

## 2023-05-21 DIAGNOSIS — G4730 Sleep apnea, unspecified: Secondary | ICD-10-CM | POA: Diagnosis not present

## 2023-05-21 DIAGNOSIS — N202 Calculus of kidney with calculus of ureter: Secondary | ICD-10-CM | POA: Diagnosis not present

## 2023-05-21 DIAGNOSIS — N390 Urinary tract infection, site not specified: Secondary | ICD-10-CM | POA: Insufficient documentation

## 2023-05-21 DIAGNOSIS — Z79899 Other long term (current) drug therapy: Secondary | ICD-10-CM | POA: Insufficient documentation

## 2023-05-21 LAB — CBC WITH DIFFERENTIAL (CANCER CENTER ONLY)
Abs Immature Granulocytes: 0.03 10*3/uL (ref 0.00–0.07)
Basophils Absolute: 0.1 10*3/uL (ref 0.0–0.1)
Basophils Relative: 1 %
Eosinophils Absolute: 0.4 10*3/uL (ref 0.0–0.5)
Eosinophils Relative: 5 %
HCT: 41.3 % (ref 39.0–52.0)
Hemoglobin: 13.8 g/dL (ref 13.0–17.0)
Immature Granulocytes: 0 %
Lymphocytes Relative: 11 %
Lymphs Abs: 0.9 10*3/uL (ref 0.7–4.0)
MCH: 31.4 pg (ref 26.0–34.0)
MCHC: 33.4 g/dL (ref 30.0–36.0)
MCV: 93.9 fL (ref 80.0–100.0)
Monocytes Absolute: 1 10*3/uL (ref 0.1–1.0)
Monocytes Relative: 12 %
Neutro Abs: 5.9 10*3/uL (ref 1.7–7.7)
Neutrophils Relative %: 71 %
Platelet Count: 163 10*3/uL (ref 150–400)
RBC: 4.4 MIL/uL (ref 4.22–5.81)
RDW: 14.6 % (ref 11.5–15.5)
WBC Count: 8.4 10*3/uL (ref 4.0–10.5)
nRBC: 0 % (ref 0.0–0.2)

## 2023-05-21 LAB — CMP (CANCER CENTER ONLY)
ALT: 14 U/L (ref 0–44)
AST: 11 U/L — ABNORMAL LOW (ref 15–41)
Albumin: 3.9 g/dL (ref 3.5–5.0)
Alkaline Phosphatase: 49 U/L (ref 38–126)
Anion gap: 6 (ref 5–15)
BUN: 21 mg/dL (ref 8–23)
CO2: 28 mmol/L (ref 22–32)
Calcium: 8.8 mg/dL — ABNORMAL LOW (ref 8.9–10.3)
Chloride: 107 mmol/L (ref 98–111)
Creatinine: 1.04 mg/dL (ref 0.61–1.24)
GFR, Estimated: >60 mL/min (ref >60)
Glucose, Bld: 120 mg/dL — ABNORMAL HIGH (ref 70–99)
Potassium: 4.1 mmol/L (ref 3.5–5.1)
Sodium: 141 mmol/L (ref 135–145)
Total Bilirubin: 0.5 mg/dL (ref 0.3–1.2)
Total Protein: 6.2 g/dL — ABNORMAL LOW (ref 6.5–8.1)

## 2023-05-21 MED ORDER — SODIUM CHLORIDE 0.9 % IV SOLN: Freq: Once | INTRAVENOUS | Status: AC

## 2023-05-21 MED ORDER — DARATUMUMAB-HYALURONIDASE-FIHJ 1800-30000 MG-UT/15ML ~~LOC~~ SOLN
1800.0000 mg | Freq: Once | SUBCUTANEOUS | Status: AC
  Administered 2023-05-21: 1800 mg via SUBCUTANEOUS
  Filled 2023-05-21: qty 15

## 2023-05-21 MED ORDER — ACETAMINOPHEN 325 MG PO TABS
650.0000 mg | ORAL_TABLET | Freq: Once | ORAL | Status: AC
  Administered 2023-05-21: 650 mg via ORAL
  Filled 2023-05-21: qty 2

## 2023-05-21 MED ORDER — ZOLEDRONIC ACID 4 MG/100ML IV SOLN
4.0000 mg | Freq: Once | INTRAVENOUS | Status: AC
  Administered 2023-05-21: 4 mg via INTRAVENOUS
  Filled 2023-05-21: qty 100

## 2023-05-21 MED ORDER — DEXAMETHASONE 4 MG PO TABS
10.0000 mg | ORAL_TABLET | Freq: Once | ORAL | Status: AC
  Administered 2023-05-21: 10 mg via ORAL
  Filled 2023-05-21: qty 3

## 2023-05-21 MED ORDER — DIPHENHYDRAMINE HCL 25 MG PO CAPS
25.0000 mg | ORAL_CAPSULE | Freq: Once | ORAL | Status: AC
  Administered 2023-05-21: 25 mg via ORAL
  Filled 2023-05-21: qty 1

## 2023-05-21 NOTE — Patient Instructions (Signed)
[facility]  Discharge Instructions: Thank you for choosing [facility] to provide your oncology and hematology care.   If you have a lab appointment with the Cancer Center, please go directly to the Cancer Center and check in at the registration area.   Wear comfortable clothing and clothing appropriate for easy access to any Portacath or PICC line.   We strive to give you quality time with your provider. You may need to reschedule your appointment if you arrive late (15 or more minutes).  Arriving late affects you and other patients whose appointments are after yours.  Also, if you miss three or more appointments without notifying the office, you may be dismissed from the clinic at the provider's discretion.      For prescription refill requests, have your pharmacy contact our office and allow 72 hours for refills to be completed.    Today you received the following chemotherapy and/or immunotherapy agents Darzalex-Faspro.      To help prevent nausea and vomiting after your treatment, we encourage you to take your nausea medication as directed.  BELOW ARE SYMPTOMS THAT SHOULD BE REPORTED IMMEDIATELY: *FEVER GREATER THAN 100.4 F (38 C) OR HIGHER *CHILLS OR SWEATING *NAUSEA AND VOMITING THAT IS NOT CONTROLLED WITH YOUR NAUSEA MEDICATION *UNUSUAL SHORTNESS OF BREATH *UNUSUAL BRUISING OR BLEEDING *URINARY PROBLEMS (pain or burning when urinating, or frequent urination) *BOWEL PROBLEMS (unusual diarrhea, constipation, pain near the anus) TENDERNESS IN MOUTH AND THROAT WITH OR WITHOUT PRESENCE OF ULCERS (sore throat, sores in mouth, or a toothache) UNUSUAL RASH, SWELLING OR PAIN  UNUSUAL VAGINAL DISCHARGE OR ITCHING   Items with * indicate a potential emergency and should be followed up as soon as possible or go to the Emergency Department if any problems should occur.  Please show the CHEMOTHERAPY ALERT CARD or IMMUNOTHERAPY ALERT CARD at  check-in to the Emergency Department and triage nurse.  Should you have questions after your visit or need to cancel or reschedule your appointment, please contact [facility]  Dept: [phone]  and follow the prompts.  Office hours are 8:00 a.m. to 4:30 p.m. Monday - Friday. Please note that voicemails left after 4:00 p.m. may not be returned until the following business day.  We are closed weekends and major holidays. You have access to a nurse at all times for urgent questions. Please call the main number to the clinic Dept: [phone] and follow the prompts.   For any non-urgent questions, you may also contact your provider using MyChart. We now offer e-Visits for anyone 29 and older to request care online for non-urgent symptoms. For details visit [email or web address].   Also download the MyChart app! Go to the app store, search "MyChart", open the app, select [facility], and log in with your MyChart username and password.  Zoledronic Acid Injection (Cancer) What is this medication? ZOLEDRONIC ACID (ZOE le dron ik AS id) treats high calcium levels in the blood caused by cancer. It may also be used with chemotherapy to treat weakened bones caused by cancer. It works by slowing down the release of calcium from bones. This lowers calcium levels in your blood. It also makes your bones stronger and less likely to break (fracture). It belongs to a group of medications called bisphosphonates. This medicine may be used for other purposes; ask your health care provider or pharmacist if you have questions. COMMON BRAND NAME(S): Zometa, Zometa Powder What should I tell  my care team before I take this medication? They need to know if you have any of these conditions: Dehydration Dental disease Kidney disease Liver disease Low levels of calcium in the blood Lung or breathing disease, such as asthma Receiving steroids, such as dexamethasone or prednisone An  unusual or allergic reaction to zoledronic acid, other medications, foods, dyes, or preservatives Pregnant or trying to get pregnant Breast-feeding How should I use this medication? This medication is injected into a vein. It is given by your care team in a hospital or clinic setting. Talk to your care team about the use of this medication in children. Special care may be needed. Overdosage: If you think you have taken too much of this medicine contact a poison control center or emergency room at once. NOTE: This medicine is only for you. Do not share this medicine with others. What if I miss a dose? Keep appointments for follow-up doses. It is important not to miss your dose. Call your care team if you are unable to keep an appointment. What may interact with this medication? Certain antibiotics given by injection Diuretics, such as bumetanide, furosemide NSAIDs, medications for pain and inflammation, such as ibuprofen or naproxen Teriparatide Thalidomide This list may not describe all possible interactions. Give your health care provider a list of all the medicines, herbs, non-prescription drugs, or dietary supplements you use. Also tell them if you smoke, drink alcohol, or use illegal drugs. Some items may interact with your medicine. What should I watch for while using this medication? Visit your care team for regular checks on your progress. It may be some time before you see the benefit from this medication. Some people who take this medication have severe bone, joint, or muscle pain. This medication may also increase your risk for jaw problems or a broken thigh bone. Tell your care team right away if you have severe pain in your jaw, bones, joints, or muscles. Tell you care team if you have any pain that does not go away or that gets worse. Tell your dentist and dental surgeon that you are taking this medication. You should not have major dental surgery while on this medication. See your  dentist to have a dental exam and fix any dental problems before starting this medication. Take good care of your teeth while on this medication. Make sure you see your dentist for regular follow-up appointments. You should make sure you get enough calcium and vitamin D while you are taking this medication. Discuss the foods you eat and the vitamins you take with your care team. Check with your care team if you have severe diarrhea, nausea, and vomiting, or if you sweat a lot. The loss of too much body fluid may make it dangerous for you to take this medication. You may need bloodwork while taking this medication. Talk to your care team if you wish to become pregnant or think you might be pregnant. This medication can cause serious birth defects. What side effects may I notice from receiving this medication? Side effects that you should report to your care team as soon as possible: Allergic reactions--skin rash, itching, hives, swelling of the face, lips, tongue, or throat Kidney injury--decrease in the amount of urine, swelling of the ankles, hands, or feet Low calcium level--muscle pain or cramps, confusion, tingling, or numbness in the hands or feet Osteonecrosis of the jaw--pain, swelling, or redness in the mouth, numbness of the jaw, poor healing after dental work, unusual discharge from the  mouth, visible bones in the mouth Severe bone, joint, or muscle pain Side effects that usually do not require medical attention (report to your care team if they continue or are bothersome): Constipation Fatigue Fever Loss of appetite Nausea Stomach pain This list may not describe all possible side effects. Call your doctor for medical advice about side effects. You may report side effects to FDA at 1-800-FDA-1088. Where should I keep my medication? This medication is given in a hospital or clinic. It will not be stored at home. NOTE: This sheet is a summary. It may not cover all possible information.  If you have questions about this medicine, talk to your doctor, pharmacist, or health care provider.  2024 Elsevier/Gold Standard (2021-09-26 00:00:00)

## 2023-05-21 NOTE — Progress Notes (Signed)
[facility] OFFICE PROGRESS NOTE   Diagnosis: Multiple myeloma  INTERVAL HISTORY:   Jeffery Higgins returns for follow-up.  He began cycle 6 Revlimid/dexamethasone 05/07/2023, daratumumab per standard schedule.  No recurrence of rash.  He notes intermittent pruritus on the elbows and buttocks.  Stable neuropathy symptoms in the feet.  No nausea or vomiting.  No mouth sores.  No diarrhea.  Eye styes are better.  He would like to see neurology regarding memory issues.  Objective:  Vital signs in last 24 hours:  Blood pressure 137/64, pulse 63, temperature 97.7 F (36.5 C), temperature source Oral, resp. rate 18, height 5\' 10"  (1.778 m), weight 257 lb 8 oz (116.8 kg), SpO2 99%.    HEENT: No thrush or ulcers.  Stye at the left upper outer eyelid, no erythema. Resp: Lungs clear bilaterally. Cardio: Regular rate and rhythm. GI: Abdomen soft and nontender.  No hepatosplenomegaly. Vascular: Trace bilateral pretibial edema. Neuro: Alert and oriented.  Follows commands. Skin: No rash.  Specifically no rash at the elbows or buttocks.   Lab Results:  Lab Results  Component Value Date   WBC 8.4 05/21/2023   HGB 13.8 05/21/2023   HCT 41.3 05/21/2023   MCV 93.9 05/21/2023   PLT 163 05/21/2023   NEUTROABS 5.9 05/21/2023    Imaging:  No results found.  Medications: I have reviewed the patient's current medications.  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 numerous enhancing lesions of the skull base and calvarium no evidence of extraosseous component 12/17/2022-MRI of the  cervical, thoracic, and lumbar spine-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, clivus, bilateral occipital condyles, ribs, sacrum, and iliac bone, no evidence of pathologic fracture, expansile lesion in C3 extends into the spinal canal by 4 mm with moderate to severe spinal canal stenosis posterior to C3, mild right foraminal narrowing at T2-T3 and T3-4 related to osseous tumor at T3 12/17/2022-SPEP-2.6 g serum M spike, IgG kappa Bone marrow biopsy 12/18/2022-multiple myeloma with plasma cells involving 60% of the cellular marrow, kappa restricted myeloma FISH panel-QNS Bone survey 12/18/2022-lucencies at the skull, right scapula, left clavicle, lumbar spine, pelvis, and left femoral neck.  Destructive lesion at C3, possible sclerotic lucent lesions in the thoracic spine 12/18/2022 pelvic radiation to C3 and humeral head-12/31/2022 Cycle 1 daratumumab-VRD 01/01/2023 (Velcade 3 weeks on/1 week off) Cycle 2 daratumumab-VRD 01/29/2023, Revlimid discontinued after 19 days due to a pruritic rash Cycle 3 daratumumab-VRD 02/26/2023, Revlimid dose reduced to 10 mg Cycle 4 daratumumab-VRD 03/26/2023, Revlimid 10 mg x 18 days, Dex changed to prednisone Monday Wednesday Friday due to rash; Revlimid placed on hold 04/08/2023 due to rash at the lower abdominal wall and pruritus Cycle 5 daratumumab/VRD 04/23/2023-patient declines to resume Revlimid due to rash/pruritus; Velcade placed on hold 04/23/2023 due to progressive neuropathy; proceeded with daratumumab and dexamethasone 04/28/2023 appointment with Dr. Melrose Nakayama [facility]-recommend continuing daratumumab and dexamethasone.  Would attempt to resume lenalidomide 5 mg days 1 through 21 with addition of daily famotidine and as needed topical steroids; escalate back to 10 mg if tolerated; dexamethasone 20 mg p.o. weekly.  Cycle length 28 days. Cycle 6 Revlimid/dexamethasone plus daratumumab 05/07/2023 (Revlimid 5 mg days 1 through 21, 28-day cycle; dexamethasone 10  mg weekly  (unable to tolerate higher dose of dexamethasone due to emotional lability); Pepcid 20 mg daily). Zometa monthly during induction therapy then every 3 months x 2 years  2.  Left shoulder pain-likely secondary to a lytic lesion at the left humeral head/neck 3.  Left chin/distal mandible numbness-likely secondary to myeloma compressing nerve roots at the brainstem or left face 4.  Hypertension 5.  Diabetes 6.  Sleep apnea 7.  Nephrolithiasis-right ureteral calculus urinary tract infection, placement of a right ureter stent right stone dislodgment 10/01/2022 8.  Asthma 9.  Report of rash 04/08/2023-location and appearance 04/23/2023 consistent with skin change related to the Velcade injection.  Disposition: Jeffery Higgins appears stable.  He is completing cycle 6 Revlimid/dexamethasone plus daratumumab.  No recurrence of the skin rash since resuming Revlimid, some pruritus.  He will continue Revlimid to complete 21 days followed by 7-day break.  Plan to proceed with daratumumab today as scheduled.  He will also receive Zometa.  Of note, dexamethasone dose is 10 mg weekly due to emotional lability on the higher dose.  We will repeat myeloma labs when he returns in 2 weeks.  CBC and chemistry panel reviewed.  Labs adequate to proceed as above.  He has persistent numbness/tingling in the feet.  Velcade discontinued about a month ago.  Hopefully we will see some improvement in his symptoms over the next few months.  He is noting memory issues.  He would like to see neurology.  He has contacted a neurologist office and reports a referral is needed.  Referral placed today.  He will return for follow-up and treatment as scheduled in 2 weeks.  Patient seen with Dr. Truett Perna.   Lonna Cobb ANP/GNP-BC   05/21/2023  9:12 AM   This was a shared visit with Lonna Cobb.  Jeffery Higgins appears unchanged.  He appears to be tolerating the Revlimid/Decadron/daratumumab well.  He will return for an office visit prior  to the next cycle in 2 weeks.  I was present for greater than 50% of today's visit.  I performed medical decision making.  Mancel Bale, MD

## 2023-05-21 NOTE — Progress Notes (Signed)
Patient seen by Lonna Cobb NP today  Vitals are within treatment parameters:Yes   Labs are within treatment parameters: Yes   Treatment plan has been signed: Yes   Per physician team, Patient is ready for treatment and there are NO modifications to the treatment plan.

## 2023-05-25 ENCOUNTER — Encounter: Payer: Self-pay | Admitting: Physician Assistant

## 2023-05-30 ENCOUNTER — Other Ambulatory Visit: Payer: Self-pay | Admitting: Oncology

## 2023-05-31 ENCOUNTER — Other Ambulatory Visit: Payer: Self-pay | Admitting: *Deleted

## 2023-05-31 MED ORDER — LENALIDOMIDE 5 MG PO CAPS: 5.0000 mg | ORAL_CAPSULE | Freq: Every day | ORAL | 0 refills | Status: DC

## 2023-06-03 ENCOUNTER — Inpatient Hospital Stay: Payer: Medicare Other | Admitting: Oncology

## 2023-06-03 ENCOUNTER — Inpatient Hospital Stay: Payer: Medicare Other

## 2023-06-03 ENCOUNTER — Telehealth: Payer: Self-pay | Admitting: *Deleted

## 2023-06-03 NOTE — Telephone Encounter (Signed)
Daughter called to report she thinks Jeffery Higgins has significant depression. He has become withdrawn, does not feed his fish or chickens much anymore. Emotionally labile-has fits of anger and severe road rage. Threw down laptop and shattered it. Very anxious, having panic attacks and says he wants to die. He will often go without bathing. Not sleeping. Wife left home for some time today due to being afraid with one of his outbursts. His memory is poor. Has been on an antidepressant before and says he did not like how it made him feel. He has H/O of depression many years ago. This RN asked if he has heard from [facility] neurology yet (referral sent 10/4) and she said no. Informed her that left him a message on 10/8, but he has not returned their call. She will f/u on this.  Informed her that each assessment here asks about depression and we will try to assess at visit tomorrow.

## 2023-06-03 NOTE — Telephone Encounter (Signed)
LVM for patient to call office or respond via MyChart to reschedule his missed appointment today.

## 2023-06-03 NOTE — Telephone Encounter (Signed)
Jeffery Higgins called back and reports he got his dates mixed up since he usually comes on Fridays. Informed him that Dr. Truett Perna can see him at [facility] tomorrow. Will work on lab/infusion appointment and he will be called.

## 2023-06-04 ENCOUNTER — Inpatient Hospital Stay (HOSPITAL_BASED_OUTPATIENT_CLINIC_OR_DEPARTMENT_OTHER): Payer: Medicare Other | Admitting: Oncology

## 2023-06-04 ENCOUNTER — Inpatient Hospital Stay: Payer: Medicare Other

## 2023-06-04 ENCOUNTER — Telehealth: Payer: Self-pay

## 2023-06-04 ENCOUNTER — Encounter: Payer: Self-pay | Admitting: Oncology

## 2023-06-04 DIAGNOSIS — C9000 Multiple myeloma not having achieved remission: Secondary | ICD-10-CM

## 2023-06-04 DIAGNOSIS — Z23 Encounter for immunization: Secondary | ICD-10-CM

## 2023-06-04 LAB — CMP (CANCER CENTER ONLY)
ALT: 16 U/L (ref 0–44)
AST: 13 U/L — ABNORMAL LOW (ref 15–41)
Albumin: 3.8 g/dL (ref 3.5–5.0)
Alkaline Phosphatase: 46 U/L (ref 38–126)
Anion gap: 6 (ref 5–15)
BUN: 15 mg/dL (ref 8–23)
CO2: 26 mmol/L (ref 22–32)
Calcium: 8.7 mg/dL — ABNORMAL LOW (ref 8.9–10.3)
Chloride: 108 mmol/L (ref 98–111)
Creatinine: 0.92 mg/dL (ref 0.61–1.24)
GFR, Estimated: >60 mL/min (ref >60)
Glucose, Bld: 195 mg/dL — ABNORMAL HIGH (ref 70–99)
Potassium: 4.3 mmol/L (ref 3.5–5.1)
Sodium: 140 mmol/L (ref 135–145)
Total Bilirubin: 0.5 mg/dL (ref 0.3–1.2)
Total Protein: 6 g/dL — ABNORMAL LOW (ref 6.5–8.1)

## 2023-06-04 LAB — CBC WITH DIFFERENTIAL (CANCER CENTER ONLY)
Abs Immature Granulocytes: 0.02 10*3/uL (ref 0.00–0.07)
Basophils Absolute: 0.1 10*3/uL (ref 0.0–0.1)
Basophils Relative: 1 %
Eosinophils Absolute: 0.4 10*3/uL (ref 0.0–0.5)
Eosinophils Relative: 5 %
HCT: 41.5 % (ref 39.0–52.0)
Hemoglobin: 13.9 g/dL (ref 13.0–17.0)
Immature Granulocytes: 0 %
Lymphocytes Relative: 21 %
Lymphs Abs: 1.6 10*3/uL (ref 0.7–4.0)
MCH: 31.4 pg (ref 26.0–34.0)
MCHC: 33.5 g/dL (ref 30.0–36.0)
MCV: 93.7 fL (ref 80.0–100.0)
Monocytes Absolute: 0.8 10*3/uL (ref 0.1–1.0)
Monocytes Relative: 11 %
Neutro Abs: 4.7 10*3/uL (ref 1.7–7.7)
Neutrophils Relative %: 62 %
Platelet Count: 156 10*3/uL (ref 150–400)
RBC: 4.43 MIL/uL (ref 4.22–5.81)
RDW: 14.7 % (ref 11.5–15.5)
WBC Count: 7.5 10*3/uL (ref 4.0–10.5)
nRBC: 0 % (ref 0.0–0.2)

## 2023-06-04 MED ORDER — DIPHENHYDRAMINE HCL 25 MG PO CAPS
25.0000 mg | ORAL_CAPSULE | Freq: Once | ORAL | Status: AC
  Administered 2023-06-04: 25 mg via ORAL
  Filled 2023-06-04: qty 1

## 2023-06-04 MED ORDER — DARATUMUMAB-HYALURONIDASE-FIHJ 1800-30000 MG-UT/15ML ~~LOC~~ SOLN
1800.0000 mg | Freq: Once | SUBCUTANEOUS | Status: AC
  Administered 2023-06-04: 1800 mg via SUBCUTANEOUS
  Filled 2023-06-04: qty 15

## 2023-06-04 MED ORDER — ACETAMINOPHEN 325 MG PO TABS
650.0000 mg | ORAL_TABLET | Freq: Once | ORAL | Status: AC
  Administered 2023-06-04: 650 mg via ORAL
  Filled 2023-06-04: qty 2

## 2023-06-04 MED ORDER — INFLUENZA VIRUS VACC SPLIT PF (FLUZONE) 0.5 ML IM SUSY: 0.5000 mL | PREFILLED_SYRINGE | Freq: Once | INTRAMUSCULAR | Status: DC

## 2023-06-04 MED ORDER — INFLUENZA VAC A&B SURF ANT ADJ 0.5 ML IM SUSY
0.5000 mL | PREFILLED_SYRINGE | Freq: Once | INTRAMUSCULAR | Status: AC
  Administered 2023-06-04: 0.5 mL via INTRAMUSCULAR
  Filled 2023-06-04: qty 0.5

## 2023-06-04 MED ORDER — DEXAMETHASONE 4 MG PO TABS
4.0000 mg | ORAL_TABLET | Freq: Once | ORAL | Status: AC
  Administered 2023-06-04: 4 mg via ORAL
  Filled 2023-06-04: qty 1

## 2023-06-04 NOTE — Addendum Note (Signed)
Addended by: Dimitri Ped on: 06/04/2023 09:30 AM   Modules accepted: Orders

## 2023-06-04 NOTE — Patient Instructions (Addendum)
[facility]  Discharge Instructions: Thank you for choosing [facility] to provide your oncology and hematology care.   If you have a lab appointment with the Cancer Center, please go directly to the Cancer Center and check in at the registration area.   Wear comfortable clothing and clothing appropriate for easy access to any Portacath or PICC line.   We strive to give you quality time with your provider. You may need to reschedule your appointment if you arrive late (15 or more minutes).  Arriving late affects you and other patients whose appointments are after yours.  Also, if you miss three or more appointments without notifying the office, you may be dismissed from the clinic at the provider's discretion.      For prescription refill requests, have your pharmacy contact our office and allow 72 hours for refills to be completed.    Today you received the following chemotherapy and/or immunotherapy agents Darzalex-Faspro.      To help prevent nausea and vomiting after your treatment, we encourage you to take your nausea medication as directed.  BELOW ARE SYMPTOMS THAT SHOULD BE REPORTED IMMEDIATELY: *FEVER GREATER THAN 100.4 F (38 C) OR HIGHER *CHILLS OR SWEATING *NAUSEA AND VOMITING THAT IS NOT CONTROLLED WITH YOUR NAUSEA MEDICATION *UNUSUAL SHORTNESS OF BREATH *UNUSUAL BRUISING OR BLEEDING *URINARY PROBLEMS (pain or burning when urinating, or frequent urination) *BOWEL PROBLEMS (unusual diarrhea, constipation, pain near the anus) TENDERNESS IN MOUTH AND THROAT WITH OR WITHOUT PRESENCE OF ULCERS (sore throat, sores in mouth, or a toothache) UNUSUAL RASH, SWELLING OR PAIN  UNUSUAL VAGINAL DISCHARGE OR ITCHING   Items with * indicate a potential emergency and should be followed up as soon as possible or go to the Emergency Department if any problems should occur.  Please show the CHEMOTHERAPY ALERT CARD or IMMUNOTHERAPY ALERT CARD at  check-in to the Emergency Department and triage nurse.  Should you have questions after your visit or need to cancel or reschedule your appointment, please contact [facility]  Dept: [phone]  and follow the prompts.  Office hours are 8:00 a.m. to 4:30 p.m. Monday - Friday. Please note that voicemails left after 4:00 p.m. may not be returned until the following business day.  We are closed weekends and major holidays. You have access to a nurse at all times for urgent questions. Please call the main number to the clinic Dept: [phone] and follow the prompts.   For any non-urgent questions, you may also contact your provider using MyChart. We now offer e-Visits for anyone 10 and older to request care online for non-urgent symptoms. For details visit [email or web address].   Also download the MyChart app! Go to the app store, search "MyChart", open the app, select [place], and log in with your MyChart username and password.  Zoledronic Acid Injection (Cancer) What is this medication? ZOLEDRONIC ACID (ZOE le dron ik AS id) treats high calcium levels in the blood caused by cancer. It may also be used with chemotherapy to treat weakened bones caused by cancer. It works by slowing down the release of calcium from bones. This lowers calcium levels in your blood. It also makes your bones stronger and less likely to break (fracture). It belongs to a group of medications called bisphosphonates. This medicine may be used for other purposes; ask your health care provider or pharmacist if you have questions. COMMON BRAND NAME(S): Zometa, Zometa Powder What should I tell  my care team before I take this medication? They need to know if you have any of these conditions: Dehydration Dental disease Kidney disease Liver disease Low levels of calcium in the blood Lung or breathing disease, such as asthma Receiving steroids, such as dexamethasone or prednisone An  unusual or allergic reaction to zoledronic acid, other medications, foods, dyes, or preservatives Pregnant or trying to get pregnant Breast-feeding How should I use this medication? This medication is injected into a vein. It is given by your care team in a hospital or clinic setting. Talk to your care team about the use of this medication in children. Special care may be needed. Overdosage: If you think you have taken too much of this medicine contact a poison control center or emergency room at once. NOTE: This medicine is only for you. Do not share this medicine with others. What if I miss a dose? Keep appointments for follow-up doses. It is important not to miss your dose. Call your care team if you are unable to keep an appointment. What may interact with this medication? Certain antibiotics given by injection Diuretics, such as bumetanide, furosemide NSAIDs, medications for pain and inflammation, such as ibuprofen or naproxen Teriparatide Thalidomide This list may not describe all possible interactions. Give your health care provider a list of all the medicines, herbs, non-prescription drugs, or dietary supplements you use. Also tell them if you smoke, drink alcohol, or use illegal drugs. Some items may interact with your medicine. What should I watch for while using this medication? Visit your care team for regular checks on your progress. It may be some time before you see the benefit from this medication. Some people who take this medication have severe bone, joint, or muscle pain. This medication may also increase your risk for jaw problems or a broken thigh bone. Tell your care team right away if you have severe pain in your jaw, bones, joints, or muscles. Tell you care team if you have any pain that does not go away or that gets worse. Tell your dentist and dental surgeon that you are taking this medication. You should not have major dental surgery while on this medication. See your  dentist to have a dental exam and fix any dental problems before starting this medication. Take good care of your teeth while on this medication. Make sure you see your dentist for regular follow-up appointments. You should make sure you get enough calcium and vitamin D while you are taking this medication. Discuss the foods you eat and the vitamins you take with your care team. Check with your care team if you have severe diarrhea, nausea, and vomiting, or if you sweat a lot. The loss of too much body fluid may make it dangerous for you to take this medication. You may need bloodwork while taking this medication. Talk to your care team if you wish to become pregnant or think you might be pregnant. This medication can cause serious birth defects. What side effects may I notice from receiving this medication? Side effects that you should report to your care team as soon as possible: Allergic reactions--skin rash, itching, hives, swelling of the face, lips, tongue, or throat Kidney injury--decrease in the amount of urine, swelling of the ankles, hands, or feet Low calcium level--muscle pain or cramps, confusion, tingling, or numbness in the hands or feet Osteonecrosis of the jaw--pain, swelling, or redness in the mouth, numbness of the jaw, poor healing after dental work, unusual discharge from the  mouth, visible bones in the mouth Severe bone, joint, or muscle pain Side effects that usually do not require medical attention (report to your care team if they continue or are bothersome): Constipation Fatigue Fever Loss of appetite Nausea Stomach pain This list may not describe all possible side effects. Call your doctor for medical advice about side effects. You may report side effects to FDA at 1-800-FDA-1088. Where should I keep my medication? This medication is given in a hospital or clinic. It will not be stored at home. NOTE: This sheet is a summary. It may not cover all possible information.  If you have questions about this medicine, talk to your doctor, pharmacist, or health care provider.  2024 Elsevier/Gold Standard (2021-09-26 00:00:00)  Influenza Vaccine Injection What is this medication? INFLUENZA VACCINE (in floo EN zuh vak SEEN) reduces the risk of the influenza (flu). It does not treat influenza. It is still possible to get influenza after receiving this vaccine, but the symptoms may be less severe or not last as long. It works by helping your immune system learn how to fight off a future infection. This medicine may be used for other purposes; ask your health care provider or pharmacist if you have questions. COMMON BRAND NAME(S): Afluria Quadrivalent, FLUAD Quadrivalent, Fluarix Quadrivalent, Flublok Quadrivalent, FLUCELVAX Quadrivalent, Flulaval Quadrivalent, Fluzone Quadrivalent What should I tell my care team before I take this medication? They need to know if you have any of these conditions: Bleeding disorder like hemophilia Fever or infection Guillain-Barre syndrome or other neurological problems Immune system problems Infection with the human immunodeficiency virus (HIV) or AIDS Low blood platelet counts Multiple sclerosis An unusual or allergic reaction to influenza virus vaccine, latex, other medications, foods, dyes, or preservatives. Different brands of vaccines contain different allergens. Some may contain latex or eggs. Talk to your care team about your allergies to make sure that you get the right vaccine. Pregnant or trying to get pregnant Breastfeeding How should I use this medication? This vaccine is injected into a muscle or under the skin. It is given by your care team. A copy of Vaccine Information Statements will be given before each vaccination. Be sure to read this sheet carefully each time. This sheet may change often. Talk to your care team to see which vaccines are right for you. Some vaccines should not be used in all age groups. Overdosage:  If you think you have taken too much of this medicine contact a poison control center or emergency room at once. NOTE: This medicine is only for you. Do not share this medicine with others. What if I miss a dose? This does not apply. What may interact with this medication? Certain medications that lower your immune system, such as etanercept, anakinra, infliximab, adalimumab Certain medications that prevent or treat blood clots, such as warfarin Chemotherapy or radiation therapy Phenytoin Steroid medications, such as prednisone or cortisone Theophylline Vaccines This list may not describe all possible interactions. Give your health care provider a list of all the medicines, herbs, non-prescription drugs, or dietary supplements you use. Also tell them if you smoke, drink alcohol, or use illegal drugs. Some items may interact with your medicine. What should I watch for while using this medication? Report any side effects that do not go away with your care team. Call your care team if any unusual symptoms occur within 6 weeks of receiving this vaccine. You may still catch the flu, but the illness is not usually as bad. You cannot get  the flu from the vaccine. The vaccine will not protect against colds or other illnesses that may cause fever. The vaccine is needed every year. What side effects may I notice from receiving this medication? Side effects that you should report to your care team as soon as possible: Allergic reactions--skin rash, itching, hives, swelling of the face, lips, tongue, or throat Side effects that usually do not require medical attention (report these to your care team if they continue or are bothersome): Chills Fatigue Headache Joint pain Loss of appetite Muscle pain Nausea Pain, redness, or irritation at injection site This list may not describe all possible side effects. Call your doctor for medical advice about side effects. You may report side effects to FDA at  1-800-FDA-1088. Where should I keep my medication? The vaccine is only given by your care team. It will not be stored at home. NOTE: This sheet is a summary. It may not cover all possible information. If you have questions about this medicine, talk to your doctor, pharmacist, or health care provider.  2024 Elsevier/Gold Standard (2022-01-13 00:00:00)

## 2023-06-04 NOTE — Telephone Encounter (Signed)
Clinical Social Work was referred by the Nurse Navigator for assessment of psychosocial needs.  CSW attempted to contact patient by phone.  Left voicemail with contact information and request for return call.

## 2023-06-04 NOTE — Progress Notes (Signed)
[facility] OFFICE PROGRESS NOTE   Diagnosis: Multiple myeloma  INTERVAL HISTORY:   Mr. Chrisler completed another cycle of Revlimid beginning 05/07/2023.  No rash or pruritus.  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  None in the hands.  No diarrhea.  He uses MiraLAX for constipation.  No pain.  Good appetite. He complains of depression symptoms and emotional lability.  He has become angry at times.  He is no longer taking prednisone.  He reports memory difficulty.  He is scheduled to see neurology next month.  Objective:  Vital signs in last 24 hours:  Blood pressure (!) 151/76, pulse 61, temperature 97.6 F (36.4 C), temperature source Temporal, resp. rate 18, weight 260 lb 11.2 oz (118.3 kg), SpO2 100%.    HEENT: No thrush Resp: Lungs clear bilaterally Cardio: Regular rate and rhythm GI: Nontender, no hepatosplenomegaly Vascular: No leg edema, support stockings in place Neuro: Alert and oriented Skin: No rash  Lab Results:  Lab Results  Component Value Date   WBC 8.4 05/21/2023   HGB 13.8 05/21/2023   HCT 41.3 05/21/2023   MCV 93.9 05/21/2023   PLT 163 05/21/2023   NEUTROABS 5.9 05/21/2023    CMP  Lab Results  Component Value Date   NA 141 05/21/2023   K 4.1 05/21/2023   CL 107 05/21/2023   CO2 28 05/21/2023   GLUCOSE 120 (H) 05/21/2023   BUN 21 05/21/2023   CREATININE 1.04 05/21/2023   CALCIUM 8.8 (L) 05/21/2023   PROT 6.2 (L) 05/21/2023   ALBUMIN 3.9 05/21/2023   AST 11 (L) 05/21/2023   ALT 14 05/21/2023   ALKPHOS 49 05/21/2023   BILITOT 0.5 05/21/2023   GFRNONAA >60 05/21/2023   GFRAA >90 08/02/2012     Medications: I have reviewed the patient's current medications.   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 numerous enhancing lesions of the skull base and calvarium no evidence of extraosseous component 12/17/2022-MRI of the cervical, thoracic, and lumbar spine-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, clivus, bilateral occipital condyles, ribs, sacrum, and iliac bone, no evidence of pathologic fracture, expansile lesion in C3 extends into the spinal canal by 4 mm with moderate to severe spinal canal stenosis posterior to C3, mild right foraminal narrowing at T2-T3 and T3-4 related to osseous tumor at T3 12/17/2022-SPEP-2.6 g serum M spike, IgG kappa Bone marrow biopsy 12/18/2022-multiple myeloma with plasma cells involving 60% of the cellular marrow, kappa restricted myeloma FISH panel-QNS Bone survey 12/18/2022-lucencies at the skull, right scapula, left clavicle, lumbar spine, pelvis, and left femoral neck.  Destructive lesion at C3, possible sclerotic lucent lesions in the thoracic spine 12/18/2022 pelvic radiation to C3 and humeral head-12/31/2022 Cycle 1 daratumumab-VRD 01/01/2023 (Velcade 3 weeks on/1 week off) Cycle 2 daratumumab-VRD 01/29/2023, Revlimid discontinued after 19 days due to a pruritic rash Cycle 3 daratumumab-VRD 02/26/2023, Revlimid dose reduced to 10 mg Cycle 4 daratumumab-VRD 03/26/2023, Revlimid 10 mg x 18 days, Dex changed to prednisone Monday Wednesday Friday due to rash; Revlimid placed on hold 04/08/2023 due to rash at the lower abdominal wall and pruritus Cycle 5 daratumumab/VRD 04/23/2023-patient declines to resume Revlimid due to rash/pruritus; Velcade placed on hold 04/23/2023  due to progressive neuropathy; proceeded with daratumumab and dexamethasone 04/28/2023 appointment with Dr. Melrose Nakayama [facility]-recommend continuing daratumumab and dexamethasone.  Would attempt to resume lenalidomide 5 mg days  1 through 21 with addition of daily famotidine and as needed topical steroids; escalate back to 10 mg if tolerated; dexamethasone 20 mg p.o. weekly.  Cycle length 28 days. Cycle 6 Revlimid/dexamethasone plus daratumumab 05/07/2023 (Revlimid 5 mg days 1 through 21, 28-day cycle; dexamethasone 10 mg weekly (unable to tolerate higher dose of dexamethasone due to emotional lability); Pepcid 20 mg daily). Zometa monthly during induction therapy then every 3 months x 2 years Cycle 7 Revlimid/Decadron plus daratumumab 06/04/2023 (Revlimid 5 mg days 1-21, 28-day cycle, Decadron 4 mg weekly (Decadron further reduced secondary to emotional lability)  2.  Left shoulder pain-likely secondary to a lytic lesion at the left humeral head/neck 3.  Left chin/distal mandible numbness-likely secondary to myeloma compressing nerve roots at the brainstem or left face 4.  Hypertension 5.  Diabetes 6.  Sleep apnea 7.  Nephrolithiasis-right ureteral calculus urinary tract infection, placement of a right ureter stent right stone dislodgment 10/01/2022 8.  Asthma 9.  Report of rash 04/08/2023-location and appearance 04/23/2023 consistent with skin change related to the Velcade injection.    Disposition: Mr. Oxborrow appears unchanged.  He is tolerating the Revlimid well.  He has emotional lability and depression symptoms.  We decreased the Decadron to 4 mg weekly.  He is no longer taking prednisone.  He agrees to follow-up with the Cancer center social work Veterinary surgeon.  He will see neurology for memory loss.  We will begin an antidepressant if recommended by neurology.  He received an influenza vaccine today.  Mr Picariello will begin another cycle of Revlimid today.  He continues daratumumab on a standard schedule.  We will follow-up on the myeloma panel from today.    Thornton Papas, MD  06/04/2023  8:37 AM

## 2023-06-04 NOTE — Progress Notes (Signed)
Patient seen by Dr. Thornton Papas today  Vitals are within treatment parameters:Yes   Labs are within treatment parameters: Yes   Treatment plan has been signed: Yes   Per physician team, Patient is ready for treatment and there are NO modifications to the treatment plan. Patient request the flu injection

## 2023-06-06 LAB — IGG: IgG (Immunoglobin G), Serum: 788 mg/dL (ref 603–1613)

## 2023-06-07 LAB — PROTEIN ELECTROPHORESIS, SERUM
A/G Ratio: 1.5 (ref 0.7–1.7)
Albumin ELP: 3.3 g/dL (ref 2.9–4.4)
Alpha-1-Globulin: 0.2 g/dL (ref 0.0–0.4)
Alpha-2-Globulin: 0.6 g/dL (ref 0.4–1.0)
Beta Globulin: 0.7 g/dL (ref 0.7–1.3)
Gamma Globulin: 0.7 g/dL (ref 0.4–1.8)
Globulin, Total: 2.2 g/dL (ref 2.2–3.9)
M-Spike, %: 0.3 g/dL — ABNORMAL HIGH
Total Protein ELP: 5.5 g/dL — ABNORMAL LOW (ref 6.0–8.5)

## 2023-06-07 LAB — KAPPA/LAMBDA LIGHT CHAINS
Kappa free light chain: 13.2 mg/L (ref 3.3–19.4)
Kappa, lambda light chain ratio: 1.02 (ref 0.26–1.65)
Lambda free light chains: 12.9 mg/L (ref 5.7–26.3)

## 2023-06-07 IMAGING — DX DG CHEST 2V
2 series · 2 of 2 positions shown · non-contrast
Comparison: June 30, 2012.

CLINICAL DATA: SOB and chest pain

EXAM:
CHEST - 2 VIEW

[chest pa]
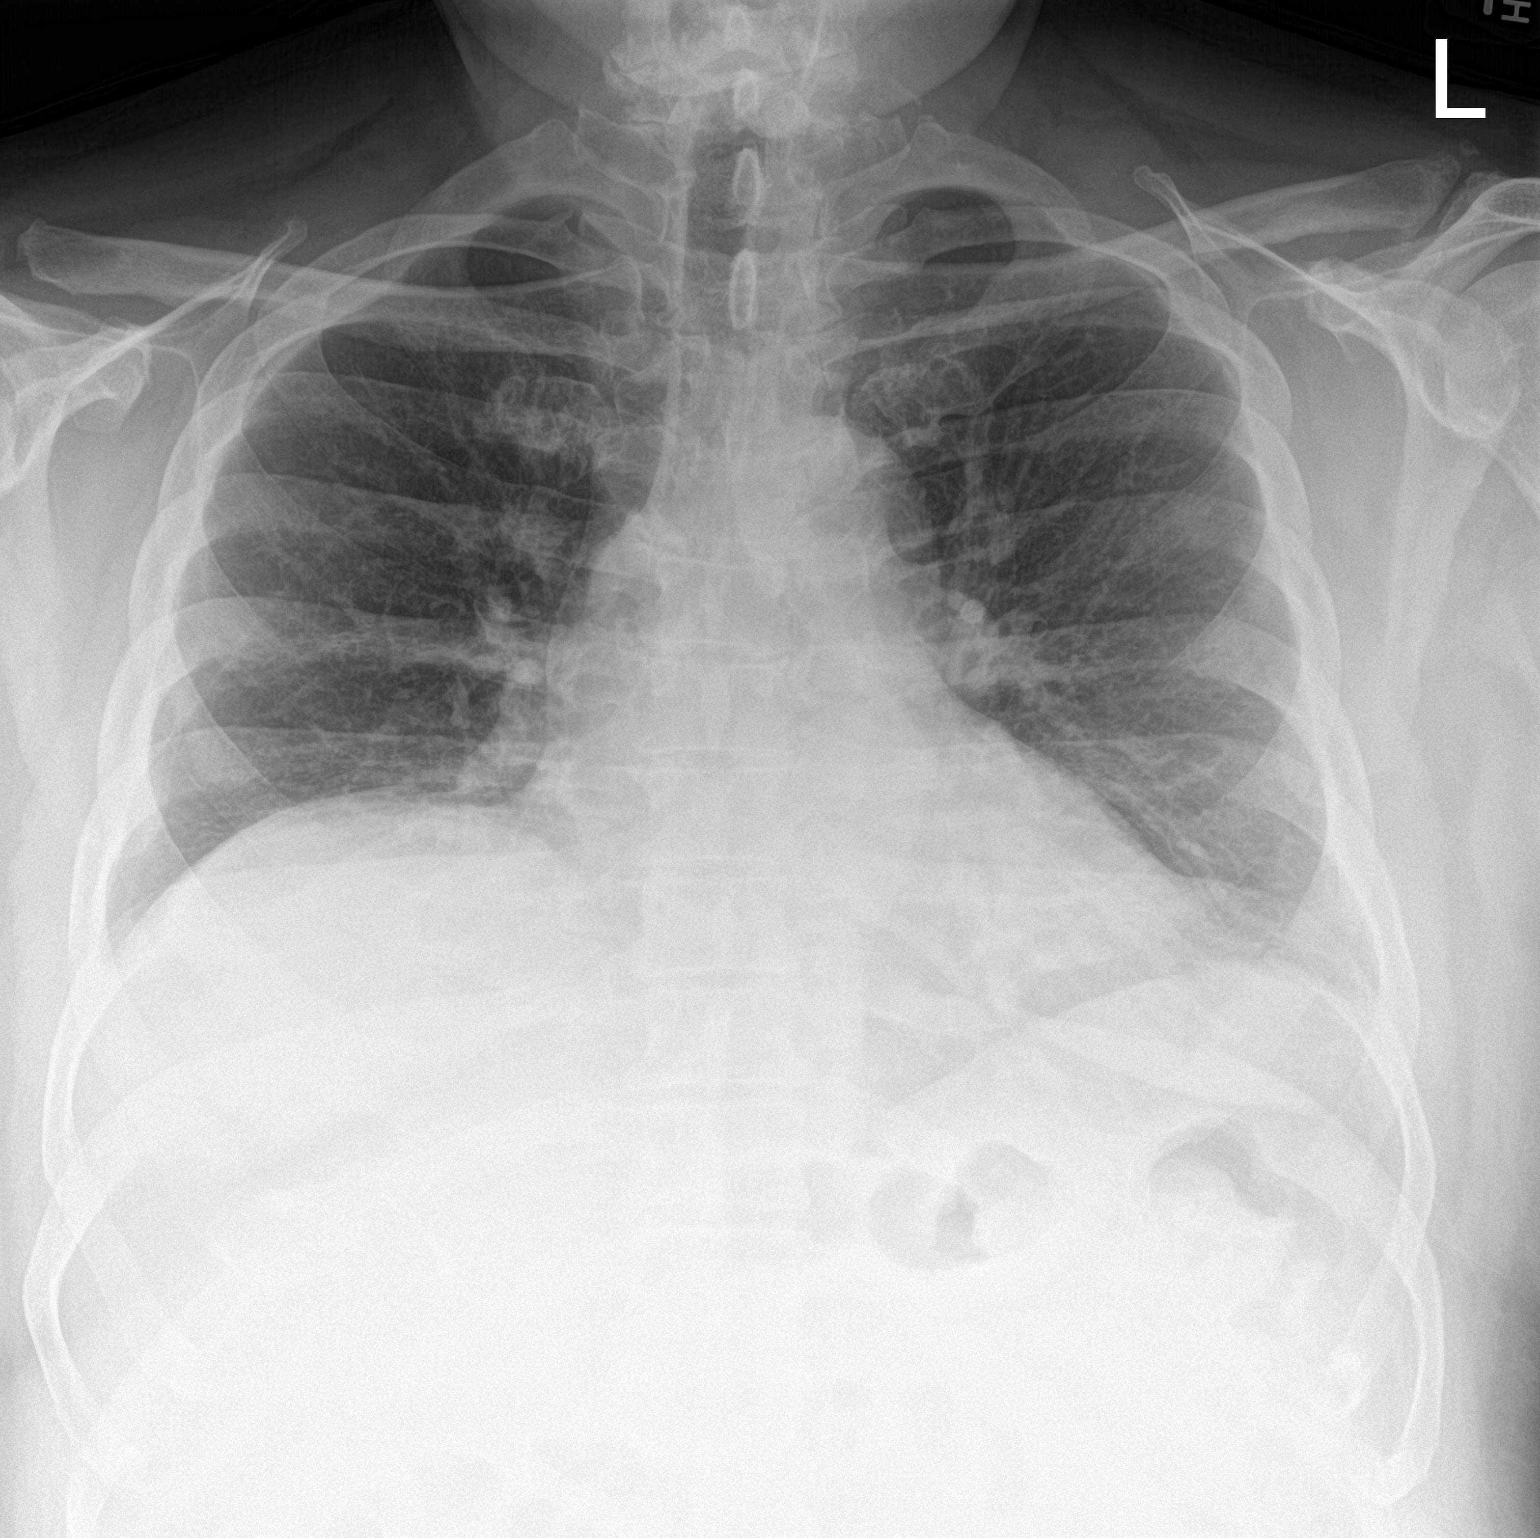

[chest lat]
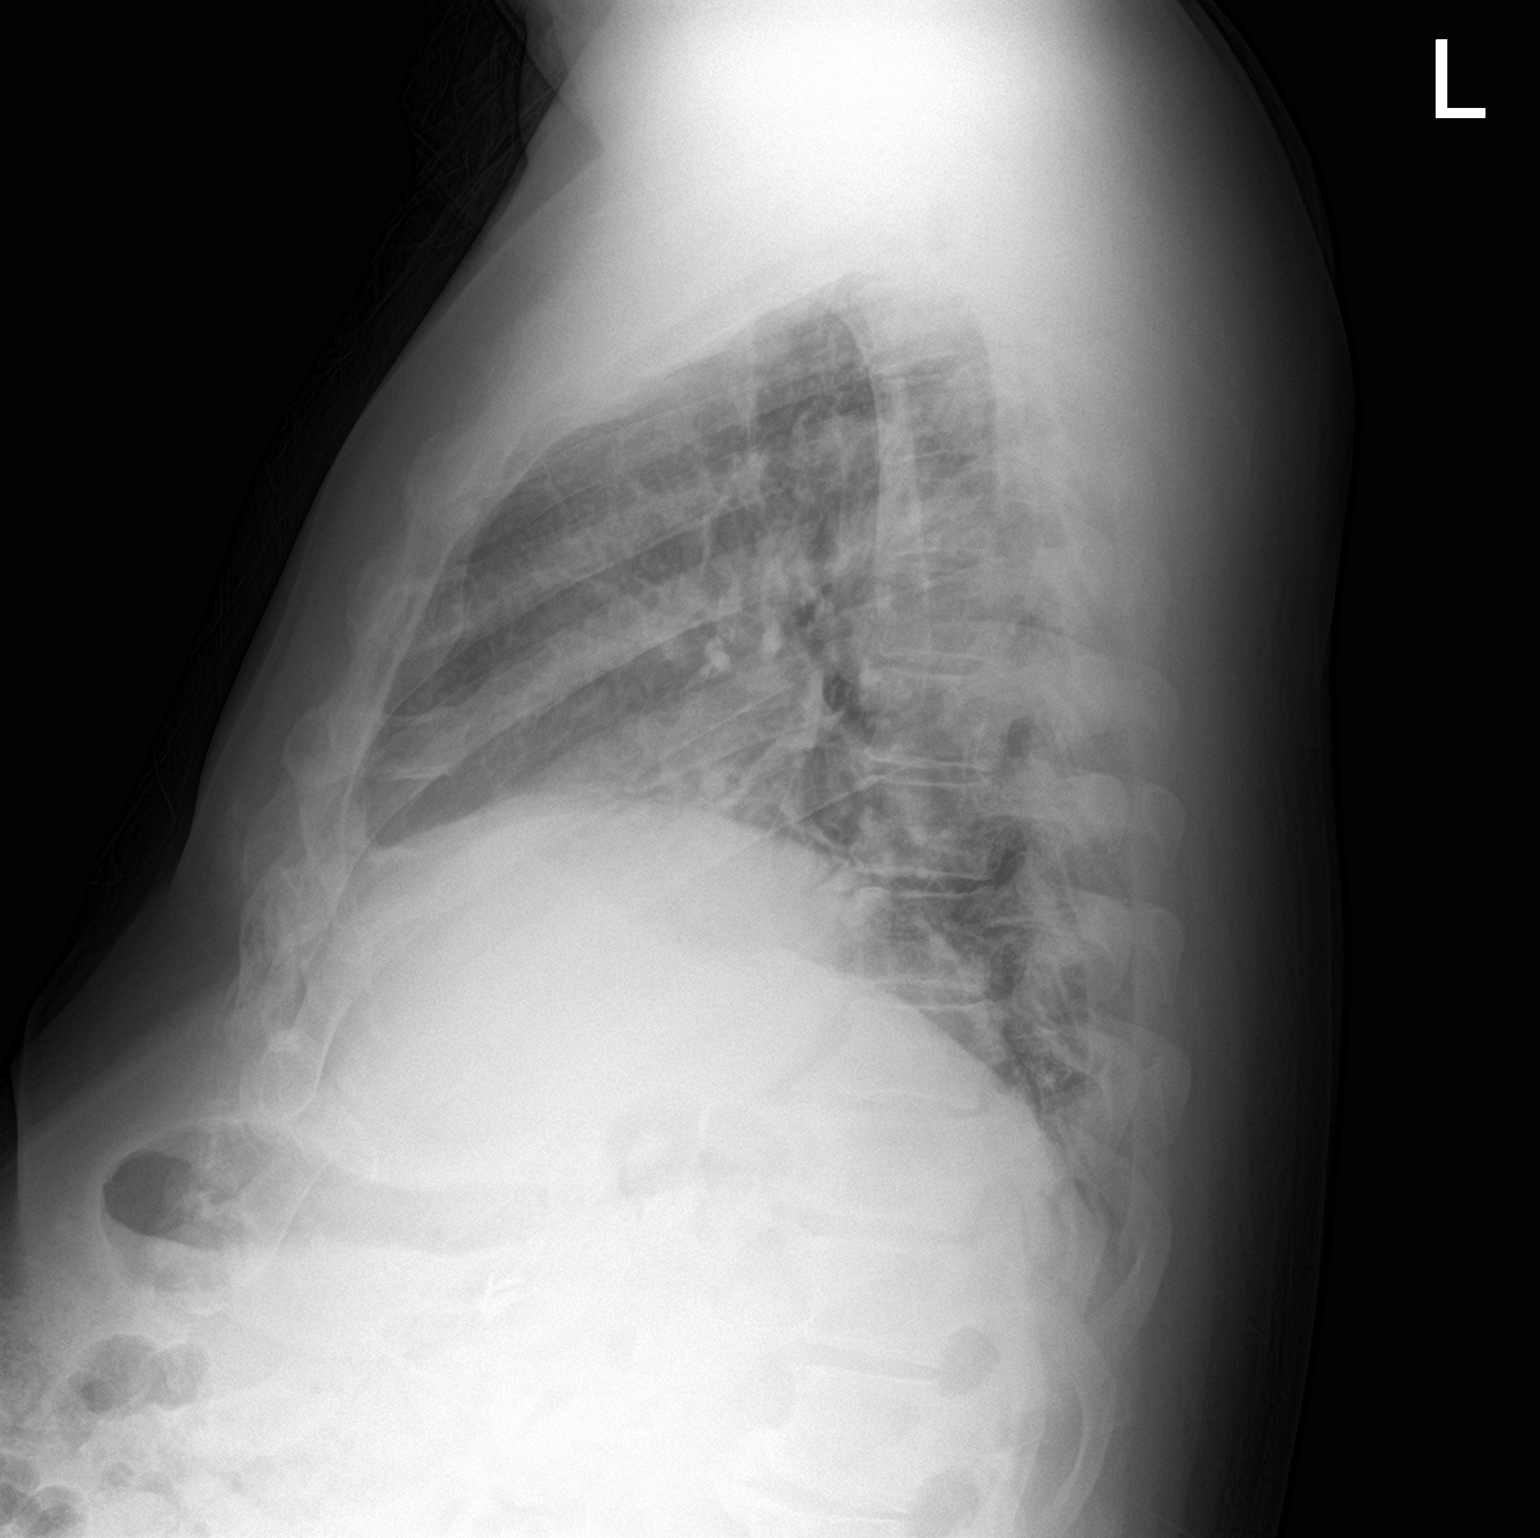

[2 of 2 positions shown; findings below may reference images not displayed]

FINDINGS: Low lung volumes. Streaky bibasilar opacities. Chronically elevated
right hemidiaphragm. No visible pleural effusions or pneumothorax.
Similar cardiomediastinal silhouette. Cholecystectomy clips.
IMPRESSION: Low lung volumes with streaky bibasilar opacities, most likely
atelectasis. No substantial change from priors.

## 2023-06-07 IMAGING — CT CT ANGIO CHEST
2 of 8 series · 19 of 36 positions shown · IV contrast (omnipaque)
Comparison: None.

CLINICAL DATA: Shortness of breath and cough x1 week.

EXAM:
CT ANGIOGRAPHY CHEST WITH CONTRAST
TECHNIQUE: Multidetector CT imaging of the chest was performed using the
standard protocol during bolus administration of intravenous
contrast. Multiplanar CT image reconstructions and MIPs were
obtained to evaluate the vascular anatomy.
CONTRAST:  100mL OMNIPAQUE IOHEXOL 350 MG/ML SOLN

[Series 6: pe thins · axial · 0.84mm/px · z∈[-341,-66]mm · 18 of 309 slices shown]
[im 17/309  lung]
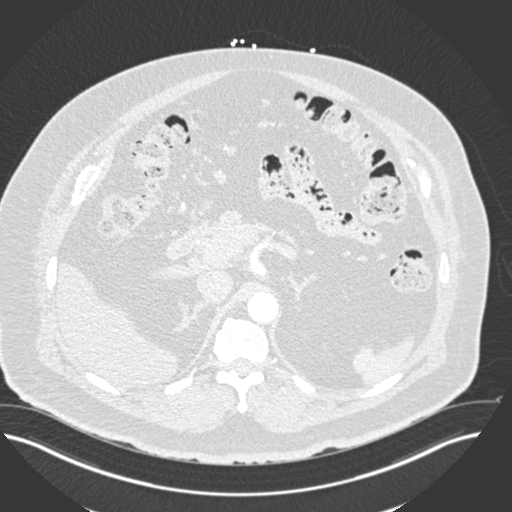
[im 33/309  mediastinal]
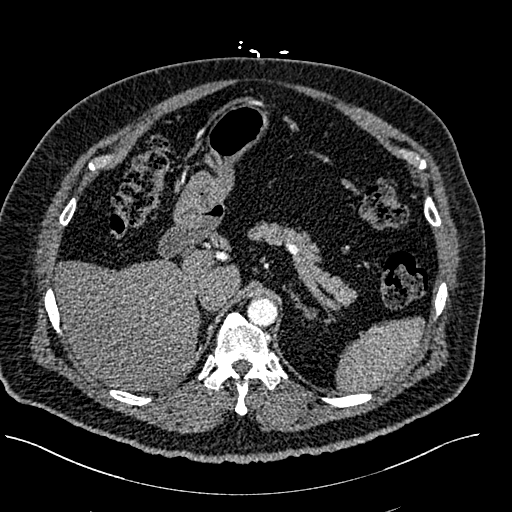
[im 49/309  lung]
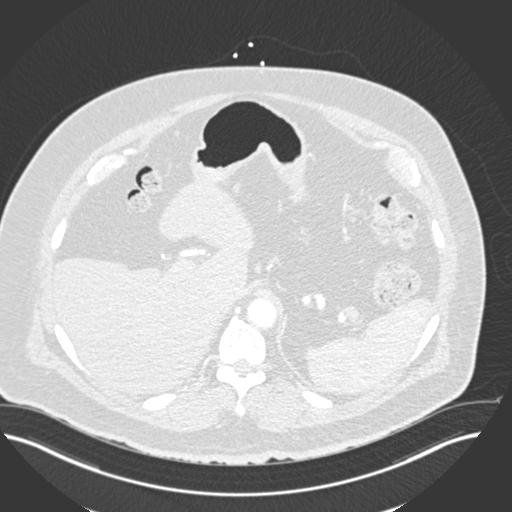
[im 65/309  mediastinal]
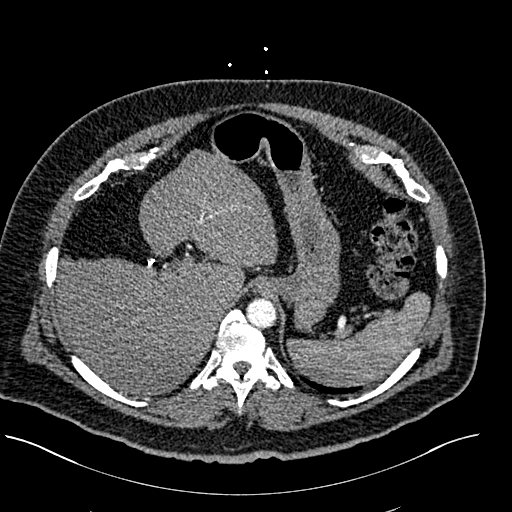
[im 82/309  lung]
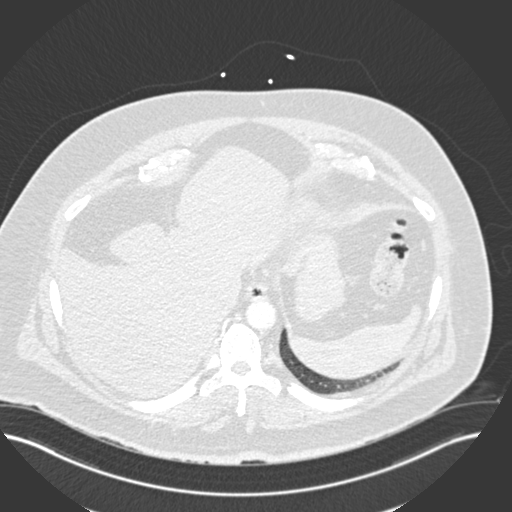
[im 98/309  mediastinal]
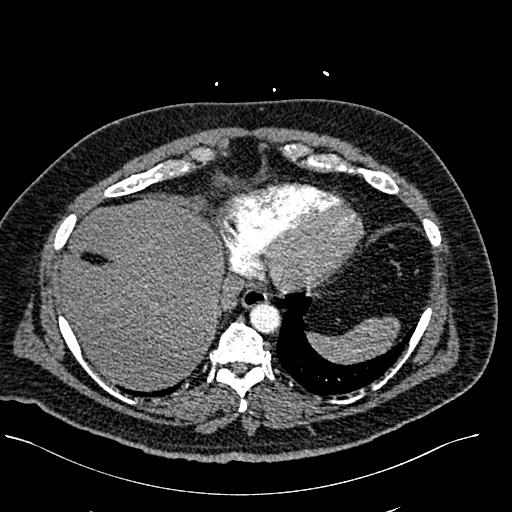
[im 114/309  lung]
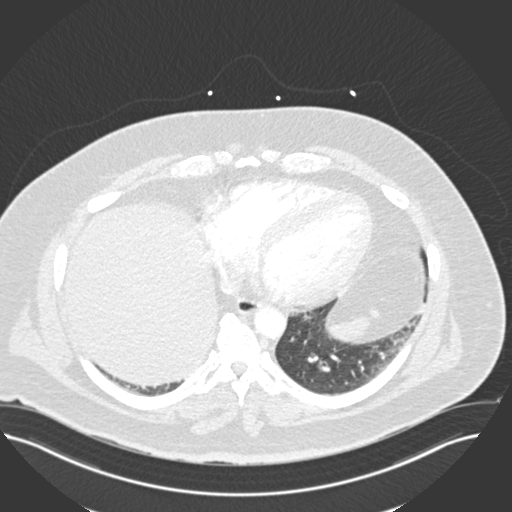
[im 130/309  mediastinal]
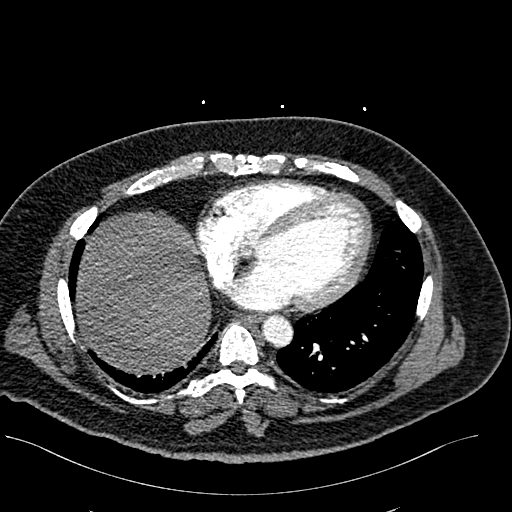
[im 146/309  lung]
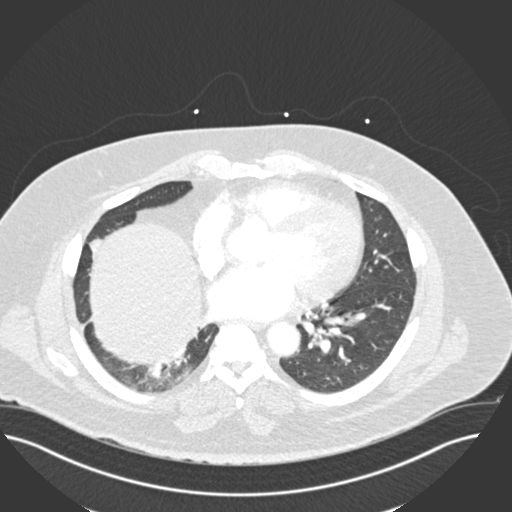
[im 163/309  mediastinal]
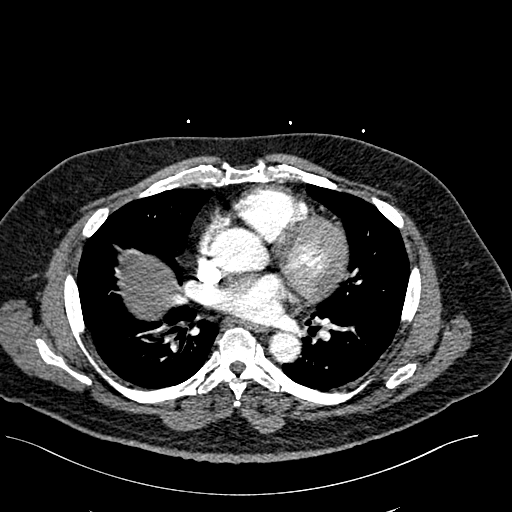
[im 179/309  lung]
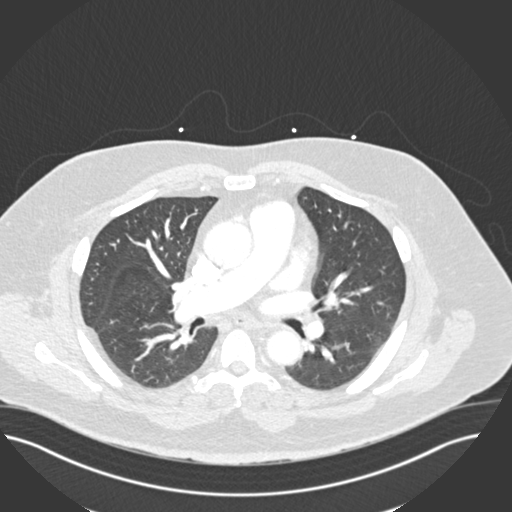
[im 195/309  mediastinal]
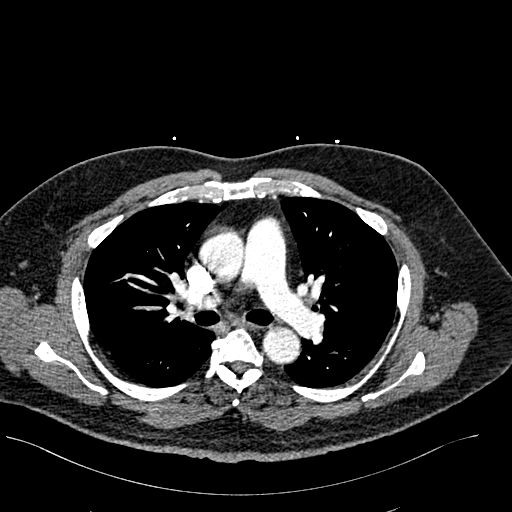
[im 211/309  lung]
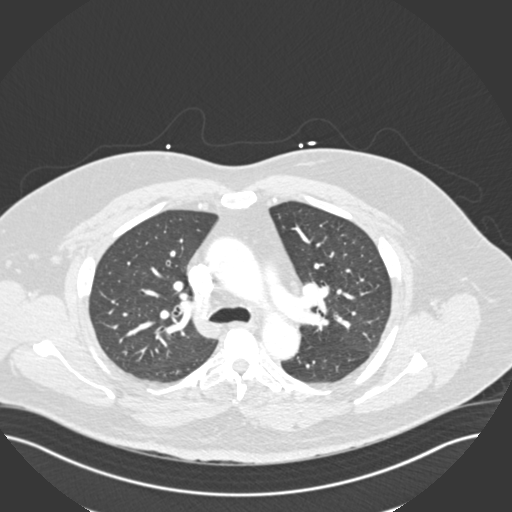
[im 227/309  mediastinal]
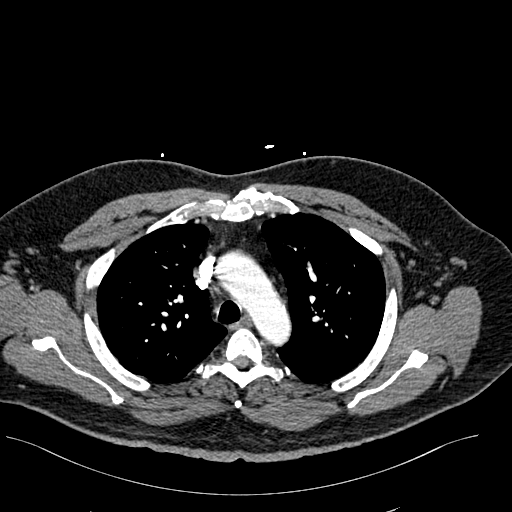
[im 244/309  lung]
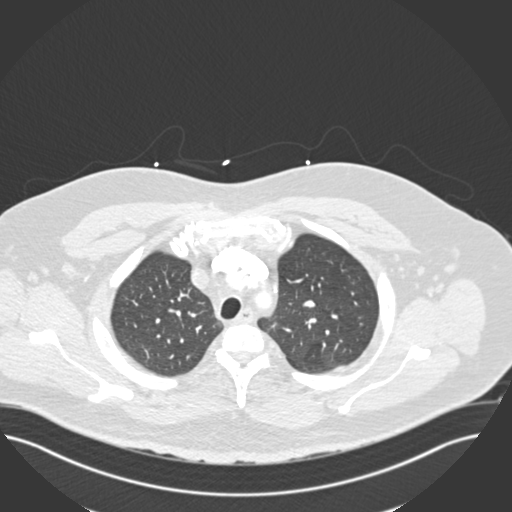
[im 260/309  mediastinal]
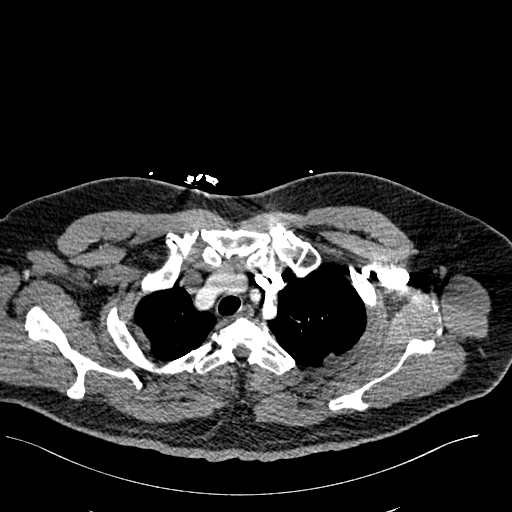
[im 276/309  lung]
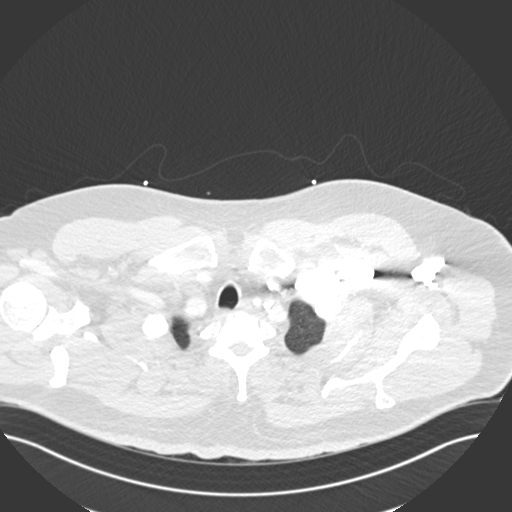
[im 292/309  mediastinal]
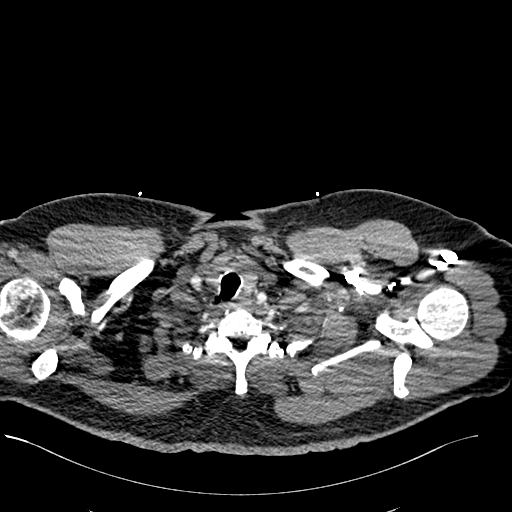

[Series 7: pe coronal mpr · coronal · 0.62mm/px · 1 of 151 slices shown]
[im 76/151  mediastinal]
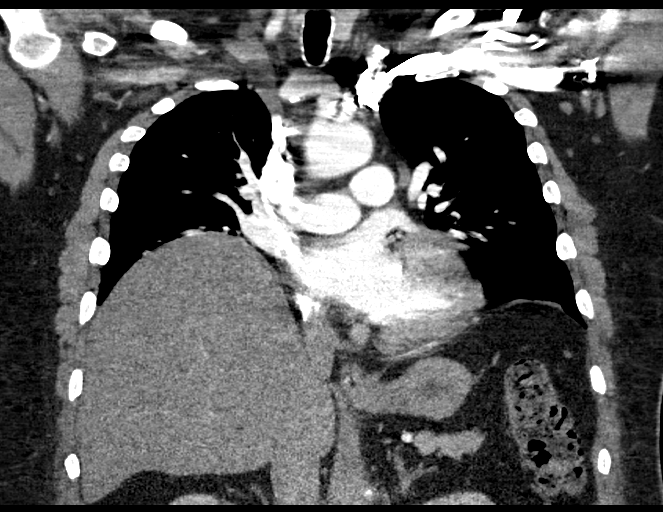

[19 of 36 positions shown; findings below may reference images not displayed]

FINDINGS: Cardiovascular: Satisfactory opacification of the pulmonary arteries
to the segmental level. No evidence of pulmonary embolism. Normal
heart size with mild to moderate severity coronary artery
calcification. No pericardial effusion.

Mediastinum/Nodes: No enlarged mediastinal, hilar, or axillary lymph
nodes. Thyroid gland, trachea, and esophagus demonstrate no
significant findings.

Lungs/Pleura: Mild atelectasis is seen within the bilateral lung
bases. Mild elevation of the right hemidiaphragm is also noted.

There is no evidence of a pleural effusion or pneumothorax.

Upper Abdomen: There is diffuse fatty infiltration of the liver
parenchyma.

Multiple surgical clips are seen within the gallbladder fossa.

Musculoskeletal: No chest wall abnormality. No acute or significant
osseous findings.

Review of the MIP images confirms the above findings.
IMPRESSION: 1. No CT evidence of pulmonary embolism or acute cardiopulmonary
disease.
2. Mild to moderate severity coronary artery calcification.
3. Fatty liver.
4. Evidence of prior cholecystectomy.

## 2023-06-09 ENCOUNTER — Telehealth: Payer: Self-pay | Admitting: *Deleted

## 2023-06-09 NOTE — Telephone Encounter (Signed)
-----   Message from Thornton Papas sent at 06/09/2023  9:02 AM EDT ----- Please call patient, the myeloma markers are improved, continue the current treatment, follow-up as scheduled

## 2023-06-09 NOTE — Telephone Encounter (Signed)
LVM that his myeloma markers are improved, which reveals treatment is working. F/U as scheduled. Light chains are normal as well.

## 2023-06-10 ENCOUNTER — Telehealth: Payer: Self-pay

## 2023-06-10 NOTE — Telephone Encounter (Signed)
Clinical Social Work was referred by medical provider for assessment of psychosocial needs.  CSW attempted to contact patient a second time by phone.  Left voicemail with contact information and request for return call.

## 2023-06-15 ENCOUNTER — Other Ambulatory Visit: Payer: Self-pay | Admitting: Oncology

## 2023-06-18 ENCOUNTER — Inpatient Hospital Stay (HOSPITAL_BASED_OUTPATIENT_CLINIC_OR_DEPARTMENT_OTHER): Payer: Medicare Other | Admitting: Nurse Practitioner

## 2023-06-18 ENCOUNTER — Inpatient Hospital Stay: Payer: Medicare Other

## 2023-06-18 ENCOUNTER — Inpatient Hospital Stay: Payer: Medicare Other | Attending: Nurse Practitioner

## 2023-06-18 ENCOUNTER — Encounter: Payer: Self-pay | Admitting: Nurse Practitioner

## 2023-06-18 VITALS — BP 141/65 | HR 61 | Temp 97.9°F | Resp 18 | Ht 70.0 in | Wt 260.0 lb

## 2023-06-18 DIAGNOSIS — C9000 Multiple myeloma not having achieved remission: Secondary | ICD-10-CM | POA: Diagnosis not present

## 2023-06-18 DIAGNOSIS — J45909 Unspecified asthma, uncomplicated: Secondary | ICD-10-CM | POA: Diagnosis not present

## 2023-06-18 DIAGNOSIS — Z87440 Personal history of urinary (tract) infections: Secondary | ICD-10-CM | POA: Insufficient documentation

## 2023-06-18 DIAGNOSIS — G4730 Sleep apnea, unspecified: Secondary | ICD-10-CM | POA: Diagnosis not present

## 2023-06-18 DIAGNOSIS — E1140 Type 2 diabetes mellitus with diabetic neuropathy, unspecified: Secondary | ICD-10-CM | POA: Insufficient documentation

## 2023-06-18 DIAGNOSIS — I10 Essential (primary) hypertension: Secondary | ICD-10-CM | POA: Insufficient documentation

## 2023-06-18 DIAGNOSIS — N390 Urinary tract infection, site not specified: Secondary | ICD-10-CM | POA: Insufficient documentation

## 2023-06-18 DIAGNOSIS — M25512 Pain in left shoulder: Secondary | ICD-10-CM | POA: Insufficient documentation

## 2023-06-18 DIAGNOSIS — N200 Calculus of kidney: Secondary | ICD-10-CM | POA: Insufficient documentation

## 2023-06-18 DIAGNOSIS — N202 Calculus of kidney with calculus of ureter: Secondary | ICD-10-CM | POA: Insufficient documentation

## 2023-06-18 LAB — CBC WITH DIFFERENTIAL (CANCER CENTER ONLY)
Abs Immature Granulocytes: 0.02 10*3/uL (ref 0.00–0.07)
Basophils Absolute: 0.1 10*3/uL (ref 0.0–0.1)
Basophils Relative: 1 %
Eosinophils Absolute: 0.7 10*3/uL — ABNORMAL HIGH (ref 0.0–0.5)
Eosinophils Relative: 10 %
HCT: 40.8 % (ref 39.0–52.0)
Hemoglobin: 13.5 g/dL (ref 13.0–17.0)
Immature Granulocytes: 0 %
Lymphocytes Relative: 16 %
Lymphs Abs: 1.2 10*3/uL (ref 0.7–4.0)
MCH: 30.8 pg (ref 26.0–34.0)
MCHC: 33.1 g/dL (ref 30.0–36.0)
MCV: 92.9 fL (ref 80.0–100.0)
Monocytes Absolute: 0.9 10*3/uL (ref 0.1–1.0)
Monocytes Relative: 12 %
Neutro Abs: 4.2 10*3/uL (ref 1.7–7.7)
Neutrophils Relative %: 61 %
Platelet Count: 158 10*3/uL (ref 150–400)
RBC: 4.39 MIL/uL (ref 4.22–5.81)
RDW: 14.6 % (ref 11.5–15.5)
WBC Count: 7 10*3/uL (ref 4.0–10.5)
nRBC: 0 % (ref 0.0–0.2)

## 2023-06-18 LAB — CMP (CANCER CENTER ONLY)
ALT: 14 U/L (ref 0–44)
AST: 12 U/L — ABNORMAL LOW (ref 15–41)
Albumin: 3.8 g/dL (ref 3.5–5.0)
Alkaline Phosphatase: 58 U/L (ref 38–126)
Anion gap: 6 (ref 5–15)
BUN: 20 mg/dL (ref 8–23)
CO2: 27 mmol/L (ref 22–32)
Calcium: 8.9 mg/dL (ref 8.9–10.3)
Chloride: 107 mmol/L (ref 98–111)
Creatinine: 0.92 mg/dL (ref 0.61–1.24)
GFR, Estimated: >60 mL/min (ref >60)
Glucose, Bld: 146 mg/dL — ABNORMAL HIGH (ref 70–99)
Potassium: 4.2 mmol/L (ref 3.5–5.1)
Sodium: 140 mmol/L (ref 135–145)
Total Bilirubin: 0.5 mg/dL (ref 0.3–1.2)
Total Protein: 6 g/dL — ABNORMAL LOW (ref 6.5–8.1)

## 2023-06-18 MED ORDER — SODIUM CHLORIDE 0.9 % IV SOLN: Freq: Once | INTRAVENOUS | Status: AC

## 2023-06-18 MED ORDER — DARATUMUMAB-HYALURONIDASE-FIHJ 1800-30000 MG-UT/15ML ~~LOC~~ SOLN
1800.0000 mg | Freq: Once | SUBCUTANEOUS | Status: AC
  Administered 2023-06-18: 1800 mg via SUBCUTANEOUS
  Filled 2023-06-18: qty 15

## 2023-06-18 MED ORDER — ACETAMINOPHEN 325 MG PO TABS
650.0000 mg | ORAL_TABLET | Freq: Once | ORAL | Status: AC
  Administered 2023-06-18: 650 mg via ORAL
  Filled 2023-06-18: qty 2

## 2023-06-18 MED ORDER — ZOLEDRONIC ACID 4 MG/100ML IV SOLN
4.0000 mg | Freq: Once | INTRAVENOUS | Status: AC
  Administered 2023-06-18: 4 mg via INTRAVENOUS
  Filled 2023-06-18: qty 100

## 2023-06-18 MED ORDER — DEXAMETHASONE 4 MG PO TABS
4.0000 mg | ORAL_TABLET | Freq: Once | ORAL | Status: AC
  Administered 2023-06-18: 4 mg via ORAL
  Filled 2023-06-18: qty 1

## 2023-06-18 MED ORDER — DIPHENHYDRAMINE HCL 25 MG PO CAPS
25.0000 mg | ORAL_CAPSULE | Freq: Once | ORAL | Status: AC
Start: 2023-06-18 — End: 2023-06-18
  Administered 2023-06-18: 25 mg via ORAL
  Filled 2023-06-18: qty 1

## 2023-06-18 NOTE — Progress Notes (Signed)
Patient seen by Lonna Cobb NP today  Vitals are within treatment parameters:Yes   Labs are within treatment parameters: Yes   Treatment plan has been signed: Yes   Per physician team, Patient is ready for treatment and there are NO modifications to the treatment plan.

## 2023-06-18 NOTE — Progress Notes (Signed)
[facility] OFFICE PROGRESS NOTE   Diagnosis: Multiple myeloma  INTERVAL HISTORY:   Mr. Strike returns as scheduled.  He completed another treatment with daratumumab 06/04/2023.  He began another cycle of Revlimid/Decadron 06/04/2023.  He denies nausea/vomiting.  No mouth sores.  No diarrhea.  No rash.  He notes a small "itchy" area at the left upper posterior thigh.  He continues to note numbness/tingling in the feet.  He thinks the symptoms are worse in the right fourth toe.  No back pain.  No neck pain.  Overall he feels better with the decreased dose of dexamethasone, stating emotions are "under better control".  Objective:  Vital signs in last 24 hours:  Blood pressure (!) 141/65, pulse 61, temperature 97.9 F (36.6 C), temperature source Temporal, resp. rate 18, height 5\' 10"  (1.778 m), weight 260 lb (117.9 kg), SpO2 99%.    HEENT: No thrush or ulcers. Resp: Lungs clear bilaterally. Cardio: Regular rate and rhythm. GI: No hepatosplenomegaly. Vascular: No leg edema. Neuro: Lower extremity motor strength 5/5. Skin: No rash.  Specifically there is no rash at the left upper posterior thigh.  There does appear to be a few mild abrasions related to scratching.   Lab Results:  Lab Results  Component Value Date   WBC 7.0 06/18/2023   HGB 13.5 06/18/2023   HCT 40.8 06/18/2023   MCV 92.9 06/18/2023   PLT 158 06/18/2023   NEUTROABS 4.2 06/18/2023    Imaging:  No results found.  Medications: I have reviewed the patient's current medications.  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 numerous enhancing lesions of the skull base and calvarium no evidence of extraosseous component 12/17/2022-MRI of the cervical, thoracic, and lumbar spine-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, clivus, bilateral occipital condyles, ribs, sacrum, and iliac bone, no evidence of pathologic fracture, expansile lesion in C3 extends into the spinal canal by 4 mm with moderate to severe spinal canal stenosis posterior to C3, mild right foraminal narrowing at T2-T3 and T3-4 related to osseous tumor at T3 12/17/2022-SPEP-2.6 g serum M spike, IgG kappa Bone marrow biopsy 12/18/2022-multiple myeloma with plasma cells involving 60% of the cellular marrow, kappa restricted myeloma FISH panel-QNS Bone survey 12/18/2022-lucencies at the skull, right scapula, left clavicle, lumbar spine, pelvis, and left femoral neck.  Destructive lesion at C3, possible sclerotic lucent lesions in the thoracic spine 12/18/2022 pelvic radiation to C3 and humeral head-12/31/2022 Cycle 1 daratumumab-VRD 01/01/2023 (Velcade 3 weeks on/1 week off) Cycle 2 daratumumab-VRD 01/29/2023, Revlimid discontinued after 19 days due to a pruritic rash Cycle 3 daratumumab-VRD 02/26/2023, Revlimid dose reduced to 10 mg Cycle 4 daratumumab-VRD 03/26/2023, Revlimid 10 mg x 18 days, Dex changed to prednisone Monday Wednesday Friday due to rash; Revlimid placed on hold 04/08/2023 due to rash at the lower abdominal wall and pruritus Cycle 5 daratumumab/VRD 04/23/2023-patient declines to resume Revlimid due to rash/pruritus; Velcade placed on hold 04/23/2023 due to progressive neuropathy; proceeded with daratumumab and dexamethasone 04/28/2023 appointment with Dr. Melrose Nakayama [facility]-recommend continuing daratumumab and dexamethasone.  Would attempt to resume lenalidomide 5 mg days 1 through 21 with addition of daily famotidine and as needed topical steroids; escalate back to 10 mg if tolerated;  dexamethasone 20 mg p.o. weekly.  Cycle length 28  days. Cycle 6 Revlimid/dexamethasone plus daratumumab 05/07/2023 (Revlimid 5 mg days 1 through 21, 28-day cycle; dexamethasone 10 mg weekly (unable to tolerate higher dose of dexamethasone due to emotional lability); Pepcid 20 mg daily). Zometa monthly during induction therapy then every 3 months x 2 years Cycle 7 Revlimid/Decadron plus daratumumab 06/04/2023 (Revlimid 5 mg days 1-21, 28-day cycle, Decadron 4 mg weekly (Decadron further reduced secondary to emotional lability)   2.  Left shoulder pain-likely secondary to a lytic lesion at the left humeral head/neck 3.  Left chin/distal mandible numbness-likely secondary to myeloma compressing nerve roots at the brainstem or left face 4.  Hypertension 5.  Diabetes 6.  Sleep apnea 7.  Nephrolithiasis-right ureteral calculus urinary tract infection, placement of a right ureter stent right stone dislodgment 10/01/2022 8.  Asthma 9.  Report of rash 04/08/2023-location and appearance 04/23/2023 consistent with skin change related to the Velcade injection.    Disposition: Mr. Otero appears stable.  He is completing cycle 7 Revlimid/Decadron plus standard schedule daratumumab.  Overall he is tolerating treatment well.  He notes improvement in emotional lability since the Decadron dose was decreased to 4 mg.  Plan to proceed with daratumumab today as scheduled.  CBC and chemistry panel reviewed.  Labs adequate to proceed as above.  He will return for follow-up and daratumumab in 2 weeks.    Lonna Cobb ANP/GNP-BC   06/18/2023  10:10 AM

## 2023-06-18 NOTE — Patient Instructions (Signed)
[facility]   Discharge Instructions: Thank you for choosing [facility] to provide your oncology and hematology care.   If you have a lab appointment with the Cancer Center, please go directly to the Cancer Center and check in at the registration area.   Wear comfortable clothing and clothing appropriate for easy access to any Portacath or PICC line.   We strive to give you quality time with your provider. You may need to reschedule your appointment if you arrive late (15 or more minutes).  Arriving late affects you and other patients whose appointments are after yours.  Also, if you miss three or more appointments without notifying the office, you may be dismissed from the clinic at the provider's discretion.      For prescription refill requests, have your pharmacy contact our office and allow 72 hours for refills to be completed.    Today you received the following chemotherapy and/or immunotherapy agents Darzalex-Faspro, Zometa      To help prevent nausea and vomiting after your treatment, we encourage you to take your nausea medication as directed.  BELOW ARE SYMPTOMS THAT SHOULD BE REPORTED IMMEDIATELY: *FEVER GREATER THAN 100.4 F (38 C) OR HIGHER *CHILLS OR SWEATING *NAUSEA AND VOMITING THAT IS NOT CONTROLLED WITH YOUR NAUSEA MEDICATION *UNUSUAL SHORTNESS OF BREATH *UNUSUAL BRUISING OR BLEEDING *URINARY PROBLEMS (pain or burning when urinating, or frequent urination) *BOWEL PROBLEMS (unusual diarrhea, constipation, pain near the anus) TENDERNESS IN MOUTH AND THROAT WITH OR WITHOUT PRESENCE OF ULCERS (sore throat, sores in mouth, or a toothache) UNUSUAL RASH, SWELLING OR PAIN  UNUSUAL VAGINAL DISCHARGE OR ITCHING   Items with * indicate a potential emergency and should be followed up as soon as possible or go to the Emergency Department if any problems should occur.  Please show the CHEMOTHERAPY ALERT CARD or IMMUNOTHERAPY ALERT  CARD at check-in to the Emergency Department and triage nurse.  Should you have questions after your visit or need to cancel or reschedule your appointment, please contact [facility]  Dept: [phone]  and follow the prompts.  Office hours are 8:00 a.m. to 4:30 p.m. Monday - Friday. Please note that voicemails left after 4:00 p.m. may not be returned until the following business day.  We are closed weekends and major holidays. You have access to a nurse at all times for urgent questions. Please call the main number to the clinic Dept: [phone] and follow the prompts.   For any non-urgent questions, you may also contact your provider using MyChart. We now offer e-Visits for anyone 29 and older to request care online for non-urgent symptoms. For details visit [email or web address].   Also download the MyChart app! Go to the app store, search "MyChart", open the app, select [place], and log in with your MyChart username and password.  Daratumumab; Hyaluronidase Injection What is this medication? DARATUMUMAB; HYALURONIDASE (dar a toom ue mab; hye al ur ON i dase) treats multiple myeloma, a type of bone marrow cancer. Daratumumab works by blocking a protein that causes cancer cells to grow and multiply. This helps to slow or stop the spread of cancer cells. Hyaluronidase works by increasing the absorption of other medications in the body to help them work better. This medication may also be used treat amyloidosis, a condition that causes the buildup of a protein (amyloid) in your body. It works by reducing the buildup of this protein, which decreases symptoms. It is  a combination medication that contains a monoclonal antibody. This medicine may be used for other purposes; ask your health care provider or pharmacist if you have questions. COMMON BRAND NAME(S): DARZALEX FASPRO What should I tell my care team before I take this medication? They need to know if  you have any of these conditions: Heart disease Infection, such as chickenpox, cold sores, herpes, hepatitis B Lung or breathing disease An unusual or allergic reaction to daratumumab, hyaluronidase, other medications, foods, dyes, or preservatives Pregnant or trying to get pregnant Breast-feeding How should I use this medication? This medication is injected under the skin. It is given by your care team in a hospital or clinic setting. Talk to your care team about the use of this medication in children. Special care may be needed. Overdosage: If you think you have taken too much of this medicine contact a poison control center or emergency room at once. NOTE: This medicine is only for you. Do not share this medicine with others. What if I miss a dose? Keep appointments for follow-up doses. It is important not to miss your dose. Call your care team if you are unable to keep an appointment. What may interact with this medication? Interactions have not been studied. This list may not describe all possible interactions. Give your health care provider a list of all the medicines, herbs, non-prescription drugs, or dietary supplements you use. Also tell them if you smoke, drink alcohol, or use illegal drugs. Some items may interact with your medicine. What should I watch for while using this medication? Your condition will be monitored carefully while you are receiving this medication. This medication can cause serious allergic reactions. To reduce your risk, your care team may give you other medication to take before receiving this one. Be sure to follow the directions from your care team. This medication can affect the results of blood tests to match your blood type. These changes can last for up to 6 months after the final dose. Your care team will do blood tests to match your blood type before you start treatment. Tell all of your care team that you are being treated with this medication before  receiving a blood transfusion. This medication can affect the results of some tests used to determine treatment response; extra tests may be needed to evaluate response. Talk to your care team if you wish to become pregnant or think you are pregnant. This medication can cause serious birth defects if taken during pregnancy and for 3 months after the last dose. A reliable form of contraception is recommended while taking this medication and for 3 months after the last dose. Talk to your care team about effective forms of contraception. Do not breast-feed while taking this medication. What side effects may I notice from receiving this medication? Side effects that you should report to your care team as soon as possible: Allergic reactions--skin rash, itching, hives, swelling of the face, lips, tongue, or throat Heart rhythm changes--fast or irregular heartbeat, dizziness, feeling faint or lightheaded, chest pain, trouble breathing Infection--fever, chills, cough, sore throat, wounds that don't heal, pain or trouble when passing urine, general feeling of discomfort or being unwell Infusion reactions--chest pain, shortness of breath or trouble breathing, feeling faint or lightheaded Sudden eye pain or change in vision such as blurry vision, seeing halos around lights, vision loss Unusual bruising or bleeding Side effects that usually do not require medical attention (report to your care team if they continue or are bothersome):  Constipation Diarrhea Fatigue Nausea Pain, tingling, or numbness in the hands or feet Swelling of the ankles, hands, or feet This list may not describe all possible side effects. Call your doctor for medical advice about side effects. You may report side effects to FDA at 1-800-FDA-1088. Where should I keep my medication? This medication is given in a hospital or clinic. It will not be stored at home. NOTE: This sheet is a summary. It may not cover all possible information.  If you have questions about this medicine, talk to your doctor, pharmacist, or health care provider.  2024 Elsevier/Gold Standard (2021-12-09 00:00:00)  Zoledronic Acid Injection (Cancer) What is this medication? ZOLEDRONIC ACID (ZOE le dron ik AS id) treats high calcium levels in the blood caused by cancer. It may also be used with chemotherapy to treat weakened bones caused by cancer. It works by slowing down the release of calcium from bones. This lowers calcium levels in your blood. It also makes your bones stronger and less likely to break (fracture). It belongs to a group of medications called bisphosphonates. This medicine may be used for other purposes; ask your health care provider or pharmacist if you have questions. COMMON BRAND NAME(S): Zometa, Zometa Powder What should I tell my care team before I take this medication? They need to know if you have any of these conditions: Dehydration Dental disease Kidney disease Liver disease Low levels of calcium in the blood Lung or breathing disease, such as asthma Receiving steroids, such as dexamethasone or prednisone An unusual or allergic reaction to zoledronic acid, other medications, foods, dyes, or preservatives Pregnant or trying to get pregnant Breast-feeding How should I use this medication? This medication is injected into a vein. It is given by your care team in a hospital or clinic setting. Talk to your care team about the use of this medication in children. Special care may be needed. Overdosage: If you think you have taken too much of this medicine contact a poison control center or emergency room at once. NOTE: This medicine is only for you. Do not share this medicine with others. What if I miss a dose? Keep appointments for follow-up doses. It is important not to miss your dose. Call your care team if you are unable to keep an appointment. What may interact with this medication? Certain antibiotics given by  injection Diuretics, such as bumetanide, furosemide NSAIDs, medications for pain and inflammation, such as ibuprofen or naproxen Teriparatide Thalidomide This list may not describe all possible interactions. Give your health care provider a list of all the medicines, herbs, non-prescription drugs, or dietary supplements you use. Also tell them if you smoke, drink alcohol, or use illegal drugs. Some items may interact with your medicine. What should I watch for while using this medication? Visit your care team for regular checks on your progress. It may be some time before you see the benefit from this medication. Some people who take this medication have severe bone, joint, or muscle pain. This medication may also increase your risk for jaw problems or a broken thigh bone. Tell your care team right away if you have severe pain in your jaw, bones, joints, or muscles. Tell you care team if you have any pain that does not go away or that gets worse. Tell your dentist and dental surgeon that you are taking this medication. You should not have major dental surgery while on this medication. See your dentist to have a dental exam and fix any dental  problems before starting this medication. Take good care of your teeth while on this medication. Make sure you see your dentist for regular follow-up appointments. You should make sure you get enough calcium and vitamin D while you are taking this medication. Discuss the foods you eat and the vitamins you take with your care team. Check with your care team if you have severe diarrhea, nausea, and vomiting, or if you sweat a lot. The loss of too much body fluid may make it dangerous for you to take this medication. You may need bloodwork while taking this medication. Talk to your care team if you wish to become pregnant or think you might be pregnant. This medication can cause serious birth defects. What side effects may I notice from receiving this  medication? Side effects that you should report to your care team as soon as possible: Allergic reactions--skin rash, itching, hives, swelling of the face, lips, tongue, or throat Kidney injury--decrease in the amount of urine, swelling of the ankles, hands, or feet Low calcium level--muscle pain or cramps, confusion, tingling, or numbness in the hands or feet Osteonecrosis of the jaw--pain, swelling, or redness in the mouth, numbness of the jaw, poor healing after dental work, unusual discharge from the mouth, visible bones in the mouth Severe bone, joint, or muscle pain Side effects that usually do not require medical attention (report to your care team if they continue or are bothersome): Constipation Fatigue Fever Loss of appetite Nausea Stomach pain This list may not describe all possible side effects. Call your doctor for medical advice about side effects. You may report side effects to FDA at 1-800-FDA-1088. Where should I keep my medication? This medication is given in a hospital or clinic. It will not be stored at home. NOTE: This sheet is a summary. It may not cover all possible information. If you have questions about this medicine, talk to your doctor, pharmacist, or health care provider.  2024 Elsevier/Gold Standard (2021-09-26 00:00:00)

## 2023-06-20 ENCOUNTER — Other Ambulatory Visit: Payer: Self-pay

## 2023-06-22 ENCOUNTER — Other Ambulatory Visit: Payer: Self-pay

## 2023-06-23 ENCOUNTER — Ambulatory Visit: Payer: Medicare Other | Admitting: Physician Assistant

## 2023-06-24 ENCOUNTER — Ambulatory Visit: Payer: Medicare Other

## 2023-06-24 ENCOUNTER — Ambulatory Visit: Payer: Medicare Other | Admitting: Physician Assistant

## 2023-06-25 ENCOUNTER — Other Ambulatory Visit: Payer: Self-pay | Admitting: *Deleted

## 2023-06-25 MED ORDER — LENALIDOMIDE 5 MG PO CAPS: 5.0000 mg | ORAL_CAPSULE | Freq: Every day | ORAL | 0 refills | Status: DC

## 2023-06-25 NOTE — Addendum Note (Signed)
Addended by: Wandalee Ferdinand on: 06/25/2023 12:14 PM   Modules accepted: Orders

## 2023-06-25 NOTE — Telephone Encounter (Signed)
Received refill request on lenalidomide from [facility].

## 2023-06-27 ENCOUNTER — Other Ambulatory Visit: Payer: Self-pay | Admitting: Oncology

## 2023-06-28 ENCOUNTER — Encounter: Payer: Self-pay | Admitting: Oncology

## 2023-06-29 ENCOUNTER — Other Ambulatory Visit: Payer: Self-pay

## 2023-07-02 ENCOUNTER — Inpatient Hospital Stay (HOSPITAL_BASED_OUTPATIENT_CLINIC_OR_DEPARTMENT_OTHER): Payer: Medicare Other | Admitting: Oncology

## 2023-07-02 ENCOUNTER — Inpatient Hospital Stay: Payer: Medicare Other

## 2023-07-02 ENCOUNTER — Encounter: Payer: Self-pay | Admitting: *Deleted

## 2023-07-02 ENCOUNTER — Other Ambulatory Visit: Payer: Self-pay | Admitting: *Deleted

## 2023-07-02 VITALS — BP 143/81 | HR 61 | Temp 98.2°F | Resp 18 | Ht 70.0 in | Wt 261.1 lb

## 2023-07-02 DIAGNOSIS — C9000 Multiple myeloma not having achieved remission: Secondary | ICD-10-CM

## 2023-07-02 LAB — CMP (CANCER CENTER ONLY)
ALT: 13 U/L (ref 0–44)
AST: 12 U/L — ABNORMAL LOW (ref 15–41)
Albumin: 4.4 g/dL (ref 3.5–5.0)
Alkaline Phosphatase: 50 U/L (ref 38–126)
Anion gap: 4 — ABNORMAL LOW (ref 5–15)
BUN: 23 mg/dL (ref 8–23)
CO2: 29 mmol/L (ref 22–32)
Calcium: 8.9 mg/dL (ref 8.9–10.3)
Chloride: 107 mmol/L (ref 98–111)
Creatinine: 0.99 mg/dL (ref 0.61–1.24)
GFR, Estimated: >60 mL/min (ref >60)
Glucose, Bld: 202 mg/dL — ABNORMAL HIGH (ref 70–99)
Potassium: 4.2 mmol/L (ref 3.5–5.1)
Sodium: 140 mmol/L (ref 135–145)
Total Bilirubin: 0.5 mg/dL (ref <1.2)
Total Protein: 6.6 g/dL (ref 6.5–8.1)

## 2023-07-02 LAB — CBC WITH DIFFERENTIAL (CANCER CENTER ONLY)
Abs Immature Granulocytes: 0.02 K/uL (ref 0.00–0.07)
Basophils Absolute: 0.1 K/uL (ref 0.0–0.1)
Basophils Relative: 2 %
Eosinophils Absolute: 0.6 K/uL — ABNORMAL HIGH (ref 0.0–0.5)
Eosinophils Relative: 8 %
HCT: 44.2 % (ref 39.0–52.0)
Hemoglobin: 14.7 g/dL (ref 13.0–17.0)
Immature Granulocytes: 0 %
Lymphocytes Relative: 19 %
Lymphs Abs: 1.5 K/uL (ref 0.7–4.0)
MCH: 30.6 pg (ref 26.0–34.0)
MCHC: 33.3 g/dL (ref 30.0–36.0)
MCV: 91.9 fL (ref 80.0–100.0)
Monocytes Absolute: 0.9 K/uL (ref 0.1–1.0)
Monocytes Relative: 12 %
Neutro Abs: 4.6 K/uL (ref 1.7–7.7)
Neutrophils Relative %: 59 %
Platelet Count: 172 K/uL (ref 150–400)
RBC: 4.81 MIL/uL (ref 4.22–5.81)
RDW: 14.8 % (ref 11.5–15.5)
WBC Count: 7.7 K/uL (ref 4.0–10.5)
nRBC: 0 % (ref 0.0–0.2)

## 2023-07-02 MED ORDER — HYDROCORTISONE 2.5 % EX CREA: TOPICAL_CREAM | Freq: Two times a day (BID) | CUTANEOUS | 2 refills | Status: AC

## 2023-07-02 MED ORDER — DEXAMETHASONE 4 MG PO TABS: 4.0000 mg | ORAL_TABLET | ORAL | 2 refills | Status: DC

## 2023-07-02 MED ORDER — DIPHENHYDRAMINE HCL 25 MG PO CAPS
25.0000 mg | ORAL_CAPSULE | Freq: Once | ORAL | Status: AC
  Administered 2023-07-02: 25 mg via ORAL
  Filled 2023-07-02: qty 1

## 2023-07-02 MED ORDER — DARATUMUMAB-HYALURONIDASE-FIHJ 1800-30000 MG-UT/15ML ~~LOC~~ SOLN
1800.0000 mg | Freq: Once | SUBCUTANEOUS | Status: AC
  Administered 2023-07-02: 1800 mg via SUBCUTANEOUS
  Filled 2023-07-02: qty 15

## 2023-07-02 MED ORDER — DEXAMETHASONE 4 MG PO TABS
4.0000 mg | ORAL_TABLET | Freq: Once | ORAL | Status: AC
  Administered 2023-07-02: 4 mg via ORAL
  Filled 2023-07-02: qty 1

## 2023-07-02 MED ORDER — ACETAMINOPHEN 325 MG PO TABS
650.0000 mg | ORAL_TABLET | Freq: Once | ORAL | Status: AC
  Administered 2023-07-02: 650 mg via ORAL
  Filled 2023-07-02: qty 2

## 2023-07-02 NOTE — Progress Notes (Signed)
Jeffery Higgins   Diagnosis: Multiple myeloma  INTERVAL HISTORY:   Jeffery Higgins returns as scheduled.  He completed another cycle of Revlimid beginning 06/04/2023.  He continues to have numbness in the feet.  He reports the cervical spine lesion has "healed ".  He has been released by Jeffery Higgins.  His emotions remain improved.  No rash.  Objective:  Vital signs in last 24 hours:  Blood pressure (!) 143/81, pulse 61, temperature 98.2 F (36.8 C), temperature source Oral, resp. rate 18, height 5\' 10"  (1.778 m), weight 261 lb 1.6 oz (118.4 kg), SpO2 98%.    HEENT: No thrush Resp: Decreased breath sounds at the right lower posterior chest, no respiratory distress Cardio: Regular rate and rhythm GI: No hepatosplenomegaly Vascular: No leg edema, the right lower leg/ankle is slightly larger than the left side  Skin: No rash  Portacath/PICC-without erythema  Lab Results:  Lab Results  Component Value Date   WBC 7.7 07/02/2023   HGB 14.7 07/02/2023   HCT 44.2 07/02/2023   MCV 91.9 07/02/2023   PLT 172 07/02/2023   NEUTROABS 4.6 07/02/2023    CMP  Lab Results  Component Value Date   NA 140 07/02/2023   K 4.2 07/02/2023   CL 107 07/02/2023   CO2 29 07/02/2023   GLUCOSE 202 (H) 07/02/2023   BUN 23 07/02/2023   CREATININE 0.99 07/02/2023   CALCIUM 8.9 07/02/2023   PROT 6.6 07/02/2023   ALBUMIN 4.4 07/02/2023   AST 12 (L) 07/02/2023   ALT 13 07/02/2023   ALKPHOS 50 07/02/2023   BILITOT 0.5 07/02/2023   GFRNONAA >60 07/02/2023   GFRAA >90 08/02/2012    No results found for: "CEA1", "CEA", "YQM578", "CA125"  No results found for: "INR", "LABPROT"  Imaging:  No results found.  Medications: I have reviewed the patient's current medications.   Assessment/Plan:  Multiple myeloma-IgG kappa 12/14/2022-SPEP-3.1 g M spike in the gamma region, 11/25/2022-x-ray left shoulder-lytic lesion in the medial aspect of the humeral head and neck  with cortical breakthrough CT lumbar spine 12/10/2022-scattered lucent lesions throughout the lumbar spine 12/16/2022-CT cervical spine-expansile destructive lesion at C3 12/17/2022-MRI brain-right 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 numerous enhancing lesions of the skull base and calvarium no evidence of extraosseous component 12/17/2022-MRI of the cervical, thoracic, and lumbar spine-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, clivus, bilateral occipital condyles, ribs, sacrum, and iliac bone, no evidence of pathologic fracture, expansile lesion in C3 extends into the spinal canal by 4 mm with moderate to severe spinal canal stenosis posterior to C3, mild right foraminal narrowing at T2-T3 and T3-4 related to osseous tumor at T3 12/17/2022-SPEP-2.6 g serum M spike, IgG kappa Bone marrow biopsy 12/18/2022-multiple myeloma with plasma cells involving 60% of the cellular marrow, kappa restricted myeloma FISH panel-QNS Bone survey 12/18/2022-lucencies at the skull, right scapula, left clavicle, lumbar spine, pelvis, and left femoral neck.  Destructive lesion at C3, possible sclerotic lucent lesions in the thoracic spine 12/18/2022 pelvic radiation to C3 and humeral head-12/31/2022 Cycle 1 daratumumab-VRD 01/01/2023 (Velcade 3 weeks on/1 week off) Cycle 2 daratumumab-VRD 01/29/2023, Revlimid discontinued after 19 days due to a pruritic rash Cycle 3 daratumumab-VRD 02/26/2023, Revlimid dose reduced to 10 mg Cycle 4 daratumumab-VRD 03/26/2023, Revlimid 10 mg x 18 days, Dex changed to prednisone Monday Wednesday Friday due to rash; Revlimid placed on hold 04/08/2023 due to rash at the lower abdominal wall and pruritus Cycle 5 daratumumab/VRD 04/23/2023-patient declines to  resume Revlimid due to rash/pruritus; Velcade placed on hold 04/23/2023 due to progressive neuropathy; proceeded with daratumumab and dexamethasone 04/28/2023 appointment with Dr. Melrose Nakayama [facility]-recommend continuing  daratumumab and dexamethasone.  Would attempt to resume lenalidomide 5 mg days 1 through 21 with addition of daily famotidine and as needed topical steroids; escalate back to 10 mg if tolerated; dexamethasone 20 mg p.o. weekly.  Cycle length 28 days. Cycle 6 Revlimid/dexamethasone plus daratumumab 05/07/2023 (Revlimid 5 mg days 1 through 21, 28-day cycle; dexamethasone 10 mg weekly (unable to tolerate higher dose of dexamethasone due to emotional lability); Pepcid 20 mg daily). Zometa monthly during induction therapy then every 3 months x 2 years Cycle 7 Revlimid/Decadron plus daratumumab 06/04/2023 (Revlimid 5 mg days 1-21, 28-day cycle, Decadron 4 mg weekly (Decadron further reduced secondary to emotional lability) Cycle 8 Revlimid/Decadron plus daratumumab 07/02/2023   2.  Left shoulder pain-likely secondary to a lytic lesion at the left humeral head/neck 3.  Left chin/distal mandible numbness-likely secondary to myeloma compressing nerve roots at the brainstem or left face 4.  Hypertension 5.  Diabetes 6.  Sleep apnea 7.  Nephrolithiasis-right ureteral calculus urinary tract infection, placement of a right ureter stent right stone dislodgment 10/01/2022 8.  Asthma 9.  Report of rash 04/08/2023-location and appearance 04/23/2023 consistent with skin change related to the Velcade injection.    Disposition: Jeffery Kinner appears stable.  He is tolerating the current Revlimid/Decadron and daratumumab well.  He will begin another cycle today.  He is now beginning a monthly daratumumab schedule.  We will follow-up on the myeloma panel from today.  The myeloma markers are improved last month.  He will return for an office visit, daratumumab, and Zometa in 1 month.  Thornton Papas, MD  07/02/2023  11:10 AM

## 2023-07-02 NOTE — Patient Instructions (Signed)
[facility] - A DEPT OF [facility]  Discharge Instructions: Thank you for choosing [facility] to provide your oncology and hematology care.   If you have a lab appointment with the Cancer Center, please go directly to the Cancer Center and check in at the registration area.   Wear comfortable clothing and clothing appropriate for easy access to any Portacath or PICC line.   We strive to give you quality time with your provider. You may need to reschedule your appointment if you arrive late (15 or more minutes).  Arriving late affects you and other patients whose appointments are after yours.  Also, if you miss three or more appointments without notifying the office, you may be dismissed from the clinic at the provider's discretion.      For prescription refill requests, have your pharmacy contact our office and allow 72 hours for refills to be completed.    Today you received the following chemotherapy and/or immunotherapy agents Darzalex-Faspro      To help prevent nausea and vomiting after your treatment, we encourage you to take your nausea medication as directed.  BELOW ARE SYMPTOMS THAT SHOULD BE REPORTED IMMEDIATELY: *FEVER GREATER THAN 100.4 F (38 C) OR HIGHER *CHILLS OR SWEATING *NAUSEA AND VOMITING THAT IS NOT CONTROLLED WITH YOUR NAUSEA MEDICATION *UNUSUAL SHORTNESS OF BREATH *UNUSUAL BRUISING OR BLEEDING *URINARY PROBLEMS (pain or burning when urinating, or frequent urination) *BOWEL PROBLEMS (unusual diarrhea, constipation, pain near the anus) TENDERNESS IN MOUTH AND THROAT WITH OR WITHOUT PRESENCE OF ULCERS (sore throat, sores in mouth, or a toothache) UNUSUAL RASH, SWELLING OR PAIN  UNUSUAL VAGINAL DISCHARGE OR ITCHING   Items with * indicate a potential emergency and should be followed up as soon as possible or go to the Emergency Department if any problems should occur.  Please show the CHEMOTHERAPY ALERT CARD or  IMMUNOTHERAPY ALERT CARD at check-in to the Emergency Department and triage nurse.  Should you have questions after your visit or need to cancel or reschedule your appointment, please contact [facility] - A DEPT OF [facility]  Dept: [phone]  and follow the prompts.  Office hours are 8:00 a.m. to 4:30 p.m. Monday - Friday. Please note that voicemails left after 4:00 p.m. may not be returned until the following business day.  We are closed weekends and major holidays. You have access to a nurse at all times for urgent questions. Please call the main number to the clinic Dept: [phone] and follow the prompts.   For any non-urgent questions, you may also contact your provider using MyChart. We now offer e-Visits for anyone 29 and older to request care online for non-urgent symptoms. For details visit [email or web address].   Also download the MyChart app! Go to the app store, search "MyChart", open the app, select [place], and log in with your MyChart username and password.  Daratumumab; Hyaluronidase Injection What is this medication? DARATUMUMAB; HYALURONIDASE (dar a toom ue mab; hye al ur ON i dase) treats multiple myeloma, a type of bone marrow cancer. Daratumumab works by blocking a protein that causes cancer cells to grow and multiply. This helps to slow or stop the spread of cancer cells. Hyaluronidase works by increasing the absorption of other medications in the body to help them work better. This medication may also be used treat amyloidosis, a condition that causes the buildup of a protein (amyloid) in your body. It works by reducing  the buildup of this protein, which decreases symptoms. It is a combination medication that contains a monoclonal antibody. This medicine may be used for other purposes; ask your health care provider or pharmacist if you have questions. COMMON BRAND NAME(S): DARZALEX FASPRO What should I tell my care team before I  take this medication? They need to know if you have any of these conditions: Heart disease Infection, such as chickenpox, cold sores, herpes, hepatitis B Lung or breathing disease An unusual or allergic reaction to daratumumab, hyaluronidase, other medications, foods, dyes, or preservatives Pregnant or trying to get pregnant Breast-feeding How should I use this medication? This medication is injected under the skin. It is given by your care team in a hospital or clinic setting. Talk to your care team about the use of this medication in children. Special care may be needed. Overdosage: If you think you have taken too much of this medicine contact a poison control center or emergency room at once. NOTE: This medicine is only for you. Do not share this medicine with others. What if I miss a dose? Keep appointments for follow-up doses. It is important not to miss your dose. Call your care team if you are unable to keep an appointment. What may interact with this medication? Interactions have not been studied. This list may not describe all possible interactions. Give your health care provider a list of all the medicines, herbs, non-prescription drugs, or dietary supplements you use. Also tell them if you smoke, drink alcohol, or use illegal drugs. Some items may interact with your medicine. What should I watch for while using this medication? Your condition will be monitored carefully while you are receiving this medication. This medication can cause serious allergic reactions. To reduce your risk, your care team may give you other medication to take before receiving this one. Be sure to follow the directions from your care team. This medication can affect the results of blood tests to match your blood type. These changes can last for up to 6 months after the final dose. Your care team will do blood tests to match your blood type before you start treatment. Tell all of your care team that you are  being treated with this medication before receiving a blood transfusion. This medication can affect the results of some tests used to determine treatment response; extra tests may be needed to evaluate response. Talk to your care team if you wish to become pregnant or think you are pregnant. This medication can cause serious birth defects if taken during pregnancy and for 3 months after the last dose. A reliable form of contraception is recommended while taking this medication and for 3 months after the last dose. Talk to your care team about effective forms of contraception. Do not breast-feed while taking this medication. What side effects may I notice from receiving this medication? Side effects that you should report to your care team as soon as possible: Allergic reactions--skin rash, itching, hives, swelling of the face, lips, tongue, or throat Heart rhythm changes--fast or irregular heartbeat, dizziness, feeling faint or lightheaded, chest pain, trouble breathing Infection--fever, chills, cough, sore throat, wounds that don't heal, pain or trouble when passing urine, general feeling of discomfort or being unwell Infusion reactions--chest pain, shortness of breath or trouble breathing, feeling faint or lightheaded Sudden eye pain or change in vision such as blurry vision, seeing halos around lights, vision loss Unusual bruising or bleeding Side effects that usually do not require medical attention (report  to your care team if they continue or are bothersome): Constipation Diarrhea Fatigue Nausea Pain, tingling, or numbness in the hands or feet Swelling of the ankles, hands, or feet This list may not describe all possible side effects. Call your doctor for medical advice about side effects. You may report side effects to FDA at 1-800-FDA-1088. Where should I keep my medication? This medication is given in a hospital or clinic. It will not be stored at home. NOTE: This sheet is a summary.  It may not cover all possible information. If you have questions about this medicine, talk to your doctor, pharmacist, or health care provider.  2024 Elsevier/Gold Standard (2021-12-09 00:00:00)

## 2023-07-02 NOTE — Progress Notes (Signed)
Patient seen by Dr. Thornton Papas today  Vitals are within treatment parameters:Yes   Labs are within treatment parameters: Yes   Treatment plan has been signed: Yes   Per physician team, Patient is ready for treatment and there are NO modifications to the treatment plan.

## 2023-07-04 LAB — IGG: IgG (Immunoglobin G), Serum: 867 mg/dL (ref 603–1613)

## 2023-07-05 LAB — PROTEIN ELECTROPHORESIS, SERUM
A/G Ratio: 1.6 (ref 0.7–1.7)
Albumin ELP: 3.8 g/dL (ref 2.9–4.4)
Alpha-1-Globulin: 0.2 g/dL (ref 0.0–0.4)
Alpha-2-Globulin: 0.7 g/dL (ref 0.4–1.0)
Beta Globulin: 0.8 g/dL (ref 0.7–1.3)
Gamma Globulin: 0.7 g/dL (ref 0.4–1.8)
Globulin, Total: 2.4 g/dL (ref 2.2–3.9)
Total Protein ELP: 6.2 g/dL (ref 6.0–8.5)

## 2023-07-05 LAB — KAPPA/LAMBDA LIGHT CHAINS
Kappa free light chain: 16.1 mg/L (ref 3.3–19.4)
Kappa, lambda light chain ratio: 1.3 (ref 0.26–1.65)
Lambda free light chains: 12.4 mg/L (ref 5.7–26.3)

## 2023-07-07 ENCOUNTER — Encounter: Payer: Self-pay | Admitting: Oncology

## 2023-07-07 NOTE — Telephone Encounter (Signed)
Telephone call  

## 2023-07-22 DIAGNOSIS — M8448XA Pathological fracture, other site, initial encounter for fracture: Secondary | ICD-10-CM | POA: Insufficient documentation

## 2023-07-23 ENCOUNTER — Other Ambulatory Visit: Payer: Self-pay

## 2023-07-23 ENCOUNTER — Other Ambulatory Visit: Payer: Self-pay | Admitting: *Deleted

## 2023-07-23 MED ORDER — LENALIDOMIDE 5 MG PO CAPS: 5.0000 mg | ORAL_CAPSULE | Freq: Every day | ORAL | 0 refills | Status: DC

## 2023-07-25 NOTE — Progress Notes (Signed)
Assessment/Plan:   Jeffery Higgins is a very pleasant 67 y.o. year old RH male with a history of hypertension, diabetes, OSA, asthma, CoVID  2 months ago, chronic pain due to likely lesions history of IgGK Multiple Myeloma followed at the [facility], s/p C8  Revlimid /  Decadron 07/02/23 as well as daratumumab monthly with periodic Zometa seen today for evaluation of memory loss.  MoCA today is 23/30.   Workup is in progress.  Most recent MRI of the brain, personally reviewed remarkable for numerous enhancing lesions at the skull base and calvarium consistent with multiple myeloma, without evidence of extraosseous component, and branch of the right superior cerebellar artery, compressing terminal segment of the right trigeminal nerve (resolved since), mild atrophy, and mild chronic microvascular changes without acute findings.  The etiology of his memory issues are likely multifactorial, in the setting of ongoing chemotherapy for multiple myeloma which may affect the processing speed, attention and memory.  After completion of his chemotherapy, a neuropsychological evaluation may be warranted, for clarity of the diagnosis but not before.  Patient is able to participate on his IADLs and continues to drive without significant difficulties.    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 Folllow up in 3 months  Subjective:   The patient is here alone    How long  did patient have memory difficulties? For the last year, after starting his treatments.  Reports some difficulty remembering new information, conversations and names. He forgets movie plots.  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 repeats oneself?  Endorsed by his family Disoriented when walking into a room?  Patient denies except occasionally not remembering what patient came to the room for    Leaving objects in unusual places? Denies.  He may misplace his wallet   Wandering behavior?  Denies.  Any personality changes?  "Been more moody since taking steroids" Any history of depression?:  Yes,  anxiety too, does not have a psychotherapist  Hallucinations or paranoia?  Denies   Seizures?  Denies    Any sleep changes?   Sleeps well except having to wake up to urinate, denies vivid dreams, REM behavior but not frequently, denies  sleepwalking   Sleep apnea? Endorsed, could not tolerate CPAP Any hygiene concerns?  Denies   Independent of bathing and dressing?  Endorsed  Does the patient needs help with medications? Patient is in charge   Who is in charge of the finances? Patient is in charge     Any changes in appetite? Has been gaining some weight  Patient have trouble swallowing? Denies.   Does the patient cook?  Yes , denies forgetting common recipes   Any kitchen accidents such as leaving the stove on? Denies.   Any history of headaches? Endorsed, less frequent since retiring from [employer]  Chronic pain ?  He has chronic left shoulder pain likely due to lytic lesion in the left humeral head.  Until  recently, he had a C3 lesion which has recovered bone tissue, this is followed by Dr. Lovell Sheehan (neurosurgery) ambulates with difficulty?  Denies.  Recent falls or head injuries? Denies.   Vision changes? Denies.   Unilateral weakness, numbness or tingling?  He had a history  of  compression  of the nerve root at the  brainstem causing left chin -distal mandible numbness, resolving 3 months ago. He has B LE neuropathy (chemo induced) Any tremors?   Denies.   Any anosmia?  Denies.   Any incontinence of urine?  Urge incontinence. Doe not wear pads  Any bowel dysfunction? Takes Miralax for chronic constipation    Patient lives with wife  History of heavy alcohol intake? Denies.   History of heavy tobacco use? Denies.   Family history of dementia? Mother had dementia ?type Does patient drive? Yes, denies getting lost, tries to drive on familiar areas   Recent labs normal CBC, unremarkable C-Met, TSH (04/14/2023) 0.3944 0.9   Past Medical History:  Diagnosis Date   Arthritis    Asthma    BMI 40.0-44.9, adult (HCC) 08/03/2012   Diabetes mellitus type 2 in obese 08/03/2012   Diabetes mellitus without complication (HCC)    History of kidney stones 08/18/2003   Hypertension    Hypertension 08/03/2012   Kidney stone    Sleep apnea    not using CPAP - could not tolerate     Past Surgical History:  Procedure Laterality Date   CHOLECYSTECTOMY  08/02/2012   Procedure: LAPAROSCOPIC CHOLECYSTECTOMY WITH INTRAOPERATIVE CHOLANGIOGRAM;  Surgeon: [name], MD;  Location: [facility] OR;  Service: General;  Laterality: N/A;  laparoscopic cholecystectomy with intraoperative cholangiogram   KNEE ARTHROSCOPY     LITHOTRIPSY     SHOULDER ARTHROSCOPY WITH SUBACROMIAL DECOMPRESSION  07/07/2012   Procedure: SHOULDER ARTHROSCOPY WITH SUBACROMIAL DECOMPRESSION;  Surgeon: [name], MD;  Location: [facility] OR;  Service: Orthopedics;  Laterality: Right;  with distal clavicle resection     Allergies  Allergen Reactions   Aspirin Swelling    High doses of aspirin, however he does take baby aspirin daily    Other     No Blood Products. [employer] Witness    Current Outpatient Medications  Medication Instructions   acetaminophen (TYLENOL) 650 mg, Oral, Every 6 hours PRN   acyclovir (ZOVIRAX) 400 MG tablet TAKE 1  TABLET(400 MG) BY MOUTH TWICE DAILY   albuterol (PROVENTIL) 2.5 mg, Inhalation, Every 6 hours PRN   albuterol (VENTOLIN HFA) 108 (90 Base) MCG/ACT inhaler 1 puff, Every 6 hours PRN   ALPRAZolam (XANAX) 0.5 mg, Oral, 2 times daily PRN, Do not drive while taking   amLODipine (NORVASC) 5 mg, Daily   aspirin EC 81 mg, Oral, Daily, Swallow whole.   cyclobenzaprine (FLEXERIL) 10 mg, 3 times daily PRN   dexamethasone (DECADRON) 4 mg, Oral, Weekly   diphenhydrAMINE (BENADRYL) 25-50 mg, Every 6 hours PRN   famotidine (PEPCID) 20 mg, Oral, Daily   glipiZIDE (GLUCOTROL XL) 10 mg, Oral, Daily   hydrocortisone 2.5 % cream Topical, 2 times daily   hydrOXYzine (ATARAX) 10 MG tablet TAKE 1 TABLET(10 MG) BY MOUTH THREE TIMES DAILY AS NEEDED   Januvia 100 mg, Oral, Daily   lenalidomide (REVLIMID) 5 mg, Oral, Daily, Take daily x 21 days on and 7 day rest. Repeat every 28 days. Start cycle on 07/30/23 after MD visit   losartan (COZAAR) 100 mg, Oral, Daily   oxyCODONE (ROXICODONE) 5 mg, Oral, Every 4 hours PRN  pantoprazole (PROTONIX) 20 mg, Oral, Daily   polyethylene glycol (MIRALAX / GLYCOLAX) 17 g, Oral, Daily   prochlorperazine (COMPAZINE) 10 mg, Oral, Every 6 hours PRN   senna (SENOKOT) 8.6 mg, Oral, 2 times daily   Zoledronic Acid (ZOMETA IV) Intravenous     VITALS:   Vitals:   07/26/23 1327 07/26/23 1505  BP: (!) 168/76 (!) 160/70  Pulse: 68   Resp: 18   SpO2: 99%   Weight: 271 lb (122.9 kg)   Height: 5\' 10"  (1.778 m)       PHYSICAL EXAM   HEENT:  Normocephalic, atraumatic. The superficial temporal arteries are without ropiness or tenderness. Cardiovascular: Regular rate and rhythm. Lungs: Clear to auscultation bilaterally. Neck: There are no carotid bruits noted bilaterally.  NEUROLOGICAL:    07/26/2023    4:00 PM  Montreal Cognitive Assessment   Visuospatial/ Executive (0/5) 4  Naming (0/3) 3  Attention: Read list of digits (0/2) 2  Attention: Read list of letters (0/1) 1   Attention: Serial 7 subtraction starting at 100 (0/3) 3  Language: Repeat phrase (0/2) 1  Language : Fluency (0/1) 1  Abstraction (0/2) 0  Delayed Recall (0/5) 2  Orientation (0/6) 6  Total 23  Adjusted Score (based on education) 23        No data to display           Orientation:  Alert and oriented to person, place and time. No aphasia or dysarthria. Fund of knowledge is appropriate. Recent memory impaired and remote memory intact.  Attention and concentration are reduced.  Able to name objects and repeat phrases. Delayed recall  2/5 Cranial nerves: There is good facial symmetry. Extraocular muscles are intact and visual fields are full to confrontational testing. Speech is fluent and clear. No tongue deviation. Hearing is intact to conversational tone. Tone: Tone is good throughout. Sensation: Sensation is intact to light touch. Vibration is intact at the bilateral big toe.  Coordination: The patient has no difficulty with RAM's or FNF bilaterally. Normal finger to nose  Motor: Strength is 5/5 in the bilateral upper and lower extremities. There is no pronator drift. There are no fasciculations noted. DTR's: Deep tendon reflexes are 2/4 bilaterally. Gait and Station: The patient is able to ambulate without difficulty The patient is able to heel toe walk . Gait is cautious and narrow. The patient is able to ambulate in a tandem fashion.       Thank you for allowing [place] the opportunity to participate in the care of this nice patient. Please do not hesitate to contact us for any questions or concerns.   Total time spent on today's visit was 70 minutes dedicated to this patient today, preparing to see patient, examining the patient, ordering tests and/or medications and counseling the patient, documenting clinical information in the EHR or other health record, independently interpreting results and communicating results to the patient/family, discussing treatment and goals, answering  patient's questions and coordinating care.  Cc:  Pcp, No  Marlowe Kays 07/26/2023 5:10 PM

## 2023-07-26 ENCOUNTER — Encounter: Payer: Self-pay | Admitting: Nurse Practitioner

## 2023-07-26 ENCOUNTER — Other Ambulatory Visit: Payer: Medicare Other

## 2023-07-26 ENCOUNTER — Ambulatory Visit (INDEPENDENT_AMBULATORY_CARE_PROVIDER_SITE_OTHER): Payer: Medicare Other | Admitting: Physician Assistant

## 2023-07-26 ENCOUNTER — Encounter: Payer: Self-pay | Admitting: Physician Assistant

## 2023-07-26 VITALS — BP 160/70 | HR 68 | Resp 18 | Ht 70.0 in | Wt 271.0 lb

## 2023-07-26 DIAGNOSIS — R413 Other amnesia: Secondary | ICD-10-CM | POA: Diagnosis not present

## 2023-07-26 NOTE — Patient Instructions (Signed)
It was a pleasure to see you today at our office.   Recommendations:  Neurocognitive evaluation at our office   Check labs today  Follow up in 3  months    For psychiatric meds, mood meds: Please have your primary care physician manage these medications.  If you have any severe symptoms of a stroke, or other severe issues such as confusion,severe chills or fever, etc call 911 or go to the ER as you may need to be evaluated further   For guidance regarding [facility] and if placement were needed at the facility, contact Social Worker tel: [phone]  For assessment of decision of mental capacity and competency:  Call Dr. Erick Blinks, geriatric psychiatrist at [phone]  Counseling regarding caregiver distress, including caregiver depression, anxiety and issues regarding community resources, adult day care programs, adult living facilities, or memory care questions:  please contact your  Primary Doctor's Social Worker   Whom to call: Memory  decline, memory medications: Call our office [phone]    [email or web address]   RECOMMENDATIONS FOR ALL PATIENTS WITH MEMORY PROBLEMS: 1. Continue to exercise (Recommend 30 minutes of walking everyday, or 3 hours every week) 2. Increase social interactions - continue going to [place] and enjoy social gatherings with friends and family 3. Eat healthy, avoid fried foods and eat more fruits and vegetables 4. Maintain adequate blood pressure, blood sugar, and blood cholesterol level. Reducing the risk of stroke and cardiovascular disease also helps promoting better memory. 5. Avoid stressful situations. Live a simple life and avoid aggravations. Organize your time and prepare for the next day in anticipation. 6. Sleep well, avoid any interruptions of sleep and avoid any distractions in the bedroom that may interfere with adequate sleep quality 7. Avoid sugar, avoid  sweets as there is a strong link between excessive sugar intake, diabetes, and cognitive impairment We discussed the Mediterranean diet, which has been shown to help patients reduce the risk of progressive memory disorders and reduces cardiovascular risk. This includes eating fish, eat fruits and green leafy vegetables, nuts like almonds and hazelnuts, walnuts, and also use olive oil. Avoid fast foods and fried foods as much as possible. Avoid sweets and sugar as sugar use has been linked to worsening of memory function.  There is always a concern of gradual progression of memory problems. If this is the case, then we may need to adjust level of care according to patient needs. Support, both to the patient and caregiver, should then be put into place.      You have been referred for a neuropsychological evaluation (i.e., evaluation of memory and thinking abilities). Please bring someone with you to this appointment if possible, as it is helpful for the doctor to hear from both you and another adult who knows you well. Please bring eyeglasses and hearing aids if you wear them.    The evaluation will take approximately 3 hours and has two parts:   The first part is a clinical interview with the neuropsychologist (Dr. Milbert Coulter or Dr. Roseanne Reno). During the interview, the neuropsychologist will speak with you and the individual you brought to the appointment.    The second part of the evaluation is testing with the doctor's technician Annabelle Harman or Selena Batten). During the testing, the technician will ask you to remember different types of material, solve problems, and answer some questionnaires. Your family member will not be present for this portion of the evaluation.   Please note: We must reserve several hours of  the neuropsychologist's time and the psychometrician's time for your evaluation appointment. As such, there is a No-Show fee of $100. If you are unable to attend any of your appointments, please contact our  office as soon as possible to reschedule.      DRIVING: Regarding driving, in patients with progressive memory problems, driving will be impaired. We advise to have someone else do the driving if trouble finding directions or if minor accidents are reported. Independent driving assessment is available to determine safety of driving.   If you are interested in the driving assessment, you can contact the following:  [employer] in [place] [phone]  [facility] [phone]  [facility] [phone]  [facility] [phone] or [phone]   FALL PRECAUTIONS: Be cautious when walking. Scan the area for obstacles that may increase the risk of trips and falls. When getting up in the mornings, sit up at the edge of the bed for a few minutes before getting out of bed. Consider elevating the bed at the head end to avoid drop of blood pressure when getting up. Walk always in a well-lit room (use night lights in the walls). Avoid area rugs or power cords from appliances in the middle of the walkways. Use a walker or a cane if necessary and consider physical therapy for balance exercise. Get your eyesight checked regularly.  FINANCIAL OVERSIGHT: Supervision, especially oversight when making financial decisions or transactions is also recommended.  HOME SAFETY: Consider the safety of the kitchen when operating appliances like stoves, microwave oven, and blender. Consider having supervision and share cooking responsibilities until no longer able to participate in those. Accidents with firearms and other hazards in the house should be identified and addressed as well.   ABILITY TO BE LEFT ALONE: If patient is unable to contact 911 operator, consider using LifeLine, or when the need is there, arrange for someone to stay with patients. Smoking is a fire hazard, consider supervision or cessation. Risk of wandering should be assessed by caregiver and if  detected at any point, supervision and safe proof recommendations should be instituted.  MEDICATION SUPERVISION: Inability to self-administer medication needs to be constantly addressed. Implement a mechanism to ensure safe administration of the medications.      Mediterranean Diet A Mediterranean diet refers to food and lifestyle choices that are based on the traditions of countries located on the [employer] [place]. This way of eating has been shown to help prevent certain conditions and improve outcomes for people who have chronic diseases, like kidney disease and heart disease. What are tips for following this plan? Lifestyle  Cook and eat meals together with your family, when possible. Drink enough fluid to keep your urine clear or pale yellow. Be physically active every day. This includes: Aerobic exercise like running or swimming. Leisure activities like gardening, walking, or housework. Get 7-8 hours of sleep each night. If recommended by your health care provider, drink red wine in moderation. This means 1 glass a day for nonpregnant women and 2 glasses a day for men. A glass of wine equals 5 oz (150 mL). Reading food labels  Check the serving size of packaged foods. For foods such as rice and pasta, the serving size refers to the amount of cooked product, not dry. Check the total fat in packaged foods. Avoid foods that have saturated fat or trans fats. Check the ingredients list for added sugars, such as corn syrup. Shopping  At the grocery store, buy most of  your food from the areas near the walls of the store. This includes: Fresh fruits and vegetables (produce). Grains, beans, nuts, and seeds. Some of these may be available in unpackaged forms or large amounts (in bulk). Fresh seafood. Poultry and eggs. Low-fat dairy products. Buy whole ingredients instead of prepackaged foods. Buy fresh fruits and vegetables in-season from local farmers markets. Buy frozen fruits and  vegetables in resealable bags. If you do not have access to quality fresh seafood, buy precooked frozen shrimp or canned fish, such as tuna, salmon, or sardines. Buy small amounts of raw or cooked vegetables, salads, or olives from the deli or salad bar at your store. Stock your pantry so you always have certain foods on hand, such as olive oil, canned tuna, canned tomatoes, rice, pasta, and beans. Cooking  Cook foods with extra-virgin olive oil instead of using butter or other vegetable oils. Have meat as a side dish, and have vegetables or grains as your main dish. This means having meat in small portions or adding small amounts of meat to foods like pasta or stew. Use beans or vegetables instead of meat in common dishes like chili or lasagna. Experiment with different cooking methods. Try roasting or broiling vegetables instead of steaming or sauteing them. Add frozen vegetables to soups, stews, pasta, or rice. Add nuts or seeds for added healthy fat at each meal. You can add these to yogurt, salads, or vegetable dishes. Marinate fish or vegetables using olive oil, lemon juice, garlic, and fresh herbs. Meal planning  Plan to eat 1 vegetarian meal one day each week. Try to work up to 2 vegetarian meals, if possible. Eat seafood 2 or more times a week. Have healthy snacks readily available, such as: Vegetable sticks with hummus. Greek yogurt. Fruit and nut trail mix. Eat balanced meals throughout the week. This includes: Fruit: 2-3 servings a day Vegetables: 4-5 servings a day Low-fat dairy: 2 servings a day Fish, poultry, or lean meat: 1 serving a day Beans and legumes: 2 or more servings a week Nuts and seeds: 1-2 servings a day Whole grains: 6-8 servings a day Extra-virgin olive oil: 3-4 servings a day Limit red meat and sweets to only a few servings a month What are my food choices? Mediterranean diet Recommended Grains: Whole-grain pasta. Brown rice. Bulgar wheat. Polenta.  Couscous. Whole-wheat bread. Orpah Cobb. Vegetables: Artichokes. Beets. Broccoli. Cabbage. Carrots. Eggplant. Green beans. Chard. Kale. Spinach. Onions. Leeks. Peas. Squash. Tomatoes. Peppers. Radishes. Fruits: Apples. Apricots. Avocado. Berries. Bananas. Cherries. Dates. Figs. Grapes. Lemons. Melon. Oranges. Peaches. Plums. Pomegranate. Meats and other protein foods: Beans. Almonds. Sunflower seeds. Pine nuts. Peanuts. Cod. Salmon. Scallops. Shrimp. Tuna. Tilapia. Clams. Oysters. Eggs. Dairy: Low-fat milk. Cheese. Greek yogurt. Beverages: Water. Red wine. Herbal tea. Fats and oils: Extra virgin olive oil. Avocado oil. Grape seed oil. Sweets and desserts: [place] yogurt with honey. Baked apples. Poached pears. Trail mix. Seasoning and other foods: Basil. Cilantro. Coriander. Cumin. Mint. Parsley. Sage. Rosemary. Tarragon. Garlic. Oregano. Thyme. Pepper. Balsalmic vinegar. Tahini. Hummus. Tomato sauce. Olives. Mushrooms. Limit these Grains: Prepackaged pasta or rice dishes. Prepackaged cereal with added sugar. Vegetables: Deep fried potatoes (french fries). Fruits: Fruit canned in syrup. Meats and other protein foods: Beef. Pork. Lamb. Poultry with skin. Hot dogs. Tomasa Blase. Dairy: Ice cream. Sour cream. Whole milk. Beverages: Juice. Sugar-sweetened soft drinks. Beer. Liquor and spirits. Fats and oils: Butter. Canola oil. Vegetable oil. Beef fat (tallow). Lard. Sweets and desserts: Cookies. Cakes. Pies. Candy. Seasoning and other foods:  Mayonnaise. Premade sauces and marinades. The items listed may not be a complete list. Talk with your dietitian about what dietary choices are right for you. Summary The Mediterranean diet includes both food and lifestyle choices. Eat a variety of fresh fruits and vegetables, beans, nuts, seeds, and whole grains. Limit the amount of red meat and sweets that you eat. Talk with your health care provider about whether it is safe for you to drink red wine in  moderation. This means 1 glass a day for nonpregnant women and 2 glasses a day for men. A glass of wine equals 5 oz (150 mL). This information is not intended to replace advice given to you by your health care provider. Make sure you discuss any questions you have with your health care provider. Document Released: 03/26/2016 Document Revised: 04/28/2016 Document Reviewed: 03/26/2016 Elsevier Interactive Patient Education  2017 [employer].

## 2023-07-27 ENCOUNTER — Other Ambulatory Visit: Payer: Self-pay

## 2023-07-27 LAB — VITAMIN B12: Vitamin B-12: 843 pg/mL (ref 200–1100)

## 2023-07-27 LAB — TSH: TSH: 1.09 m[IU]/L (ref 0.40–4.50)

## 2023-07-27 NOTE — Progress Notes (Signed)
Blood tests are normal, thanks

## 2023-07-30 ENCOUNTER — Telehealth: Payer: Self-pay

## 2023-07-30 ENCOUNTER — Inpatient Hospital Stay: Payer: Medicare Other

## 2023-07-30 ENCOUNTER — Inpatient Hospital Stay: Payer: Medicare Other | Attending: Nurse Practitioner | Admitting: Nurse Practitioner

## 2023-07-30 ENCOUNTER — Encounter: Payer: Self-pay | Admitting: Nurse Practitioner

## 2023-07-30 ENCOUNTER — Other Ambulatory Visit: Payer: Self-pay

## 2023-07-30 VITALS — BP 124/64 | HR 70 | Temp 98.2°F | Resp 18 | Ht 70.0 in | Wt 268.8 lb

## 2023-07-30 DIAGNOSIS — Z87440 Personal history of urinary (tract) infections: Secondary | ICD-10-CM | POA: Diagnosis not present

## 2023-07-30 DIAGNOSIS — Z5112 Encounter for antineoplastic immunotherapy: Secondary | ICD-10-CM | POA: Insufficient documentation

## 2023-07-30 DIAGNOSIS — G4730 Sleep apnea, unspecified: Secondary | ICD-10-CM | POA: Insufficient documentation

## 2023-07-30 DIAGNOSIS — E1169 Type 2 diabetes mellitus with other specified complication: Secondary | ICD-10-CM

## 2023-07-30 DIAGNOSIS — E1140 Type 2 diabetes mellitus with diabetic neuropathy, unspecified: Secondary | ICD-10-CM | POA: Diagnosis not present

## 2023-07-30 DIAGNOSIS — M25512 Pain in left shoulder: Secondary | ICD-10-CM | POA: Insufficient documentation

## 2023-07-30 DIAGNOSIS — N390 Urinary tract infection, site not specified: Secondary | ICD-10-CM | POA: Insufficient documentation

## 2023-07-30 DIAGNOSIS — N200 Calculus of kidney: Secondary | ICD-10-CM | POA: Diagnosis not present

## 2023-07-30 DIAGNOSIS — I10 Essential (primary) hypertension: Secondary | ICD-10-CM | POA: Insufficient documentation

## 2023-07-30 DIAGNOSIS — C9000 Multiple myeloma not having achieved remission: Secondary | ICD-10-CM

## 2023-07-30 DIAGNOSIS — E669 Obesity, unspecified: Secondary | ICD-10-CM | POA: Diagnosis not present

## 2023-07-30 DIAGNOSIS — N202 Calculus of kidney with calculus of ureter: Secondary | ICD-10-CM | POA: Diagnosis not present

## 2023-07-30 DIAGNOSIS — J45909 Unspecified asthma, uncomplicated: Secondary | ICD-10-CM | POA: Insufficient documentation

## 2023-07-30 LAB — CMP (CANCER CENTER ONLY)
ALT: 13 U/L (ref 0–44)
AST: 12 U/L — ABNORMAL LOW (ref 15–41)
Albumin: 3.8 g/dL (ref 3.5–5.0)
Alkaline Phosphatase: 49 U/L (ref 38–126)
Anion gap: 6 (ref 5–15)
BUN: 23 mg/dL (ref 8–23)
CO2: 27 mmol/L (ref 22–32)
Calcium: 8.5 mg/dL — ABNORMAL LOW (ref 8.9–10.3)
Chloride: 108 mmol/L (ref 98–111)
Creatinine: 1.01 mg/dL (ref 0.61–1.24)
GFR, Estimated: >60 mL/min (ref >60)
Glucose, Bld: 175 mg/dL — ABNORMAL HIGH (ref 70–99)
Potassium: 4.2 mmol/L (ref 3.5–5.1)
Sodium: 141 mmol/L (ref 135–145)
Total Bilirubin: 0.5 mg/dL (ref <1.2)
Total Protein: 6 g/dL — ABNORMAL LOW (ref 6.5–8.1)

## 2023-07-30 LAB — CBC WITH DIFFERENTIAL (CANCER CENTER ONLY)
Abs Immature Granulocytes: 0.01 10*3/uL (ref 0.00–0.07)
Basophils Absolute: 0.1 10*3/uL (ref 0.0–0.1)
Basophils Relative: 1 %
Eosinophils Absolute: 0.5 10*3/uL (ref 0.0–0.5)
Eosinophils Relative: 7 %
HCT: 42.1 % (ref 39.0–52.0)
Hemoglobin: 13.5 g/dL (ref 13.0–17.0)
Immature Granulocytes: 0 %
Lymphocytes Relative: 24 %
Lymphs Abs: 1.6 10*3/uL (ref 0.7–4.0)
MCH: 30.3 pg (ref 26.0–34.0)
MCHC: 32.1 g/dL (ref 30.0–36.0)
MCV: 94.4 fL (ref 80.0–100.0)
Monocytes Absolute: 0.9 10*3/uL (ref 0.1–1.0)
Monocytes Relative: 13 %
Neutro Abs: 3.7 10*3/uL (ref 1.7–7.7)
Neutrophils Relative %: 55 %
Platelet Count: 150 10*3/uL (ref 150–400)
RBC: 4.46 MIL/uL (ref 4.22–5.81)
RDW: 14.5 % (ref 11.5–15.5)
WBC Count: 6.8 10*3/uL (ref 4.0–10.5)
nRBC: 0 % (ref 0.0–0.2)

## 2023-07-30 LAB — HEMOGLOBIN A1C
Hgb A1c MFr Bld: 6.3 % — ABNORMAL HIGH (ref 4.8–5.6)
Mean Plasma Glucose: 134.11 mg/dL

## 2023-07-30 MED ORDER — DEXAMETHASONE 4 MG PO TABS
4.0000 mg | ORAL_TABLET | Freq: Once | ORAL | Status: AC
  Administered 2023-07-30: 4 mg via ORAL
  Filled 2023-07-30: qty 1

## 2023-07-30 MED ORDER — ACETAMINOPHEN 325 MG PO TABS
650.0000 mg | ORAL_TABLET | Freq: Once | ORAL | Status: AC
  Administered 2023-07-30: 650 mg via ORAL
  Filled 2023-07-30: qty 2

## 2023-07-30 MED ORDER — DARATUMUMAB-HYALURONIDASE-FIHJ 1800-30000 MG-UT/15ML ~~LOC~~ SOLN
1800.0000 mg | Freq: Once | SUBCUTANEOUS | Status: AC
Start: 2023-07-30 — End: 2023-07-30
  Administered 2023-07-30: 1800 mg via SUBCUTANEOUS
  Filled 2023-07-30: qty 15

## 2023-07-30 MED ORDER — DARATUMUMAB-HYALURONIDASE-FIHJ 1800-30000 MG-UT/15ML ~~LOC~~ SOLN: 1800.0000 mg | Freq: Once | SUBCUTANEOUS | Status: DC

## 2023-07-30 MED ORDER — DIPHENHYDRAMINE HCL 25 MG PO CAPS
25.0000 mg | ORAL_CAPSULE | Freq: Once | ORAL | Status: AC
  Administered 2023-07-30: 25 mg via ORAL
  Filled 2023-07-30: qty 1

## 2023-07-30 NOTE — Telephone Encounter (Signed)
Route the result to the pcp

## 2023-07-30 NOTE — Patient Instructions (Signed)
[facility] - A DEPT OF [facility]  Discharge Instructions: Thank you for choosing [facility] to provide your oncology and hematology care.   If you have a lab appointment with the Cancer Center, please go directly to the Cancer Center and check in at the registration area.   Wear comfortable clothing and clothing appropriate for easy access to any Portacath or PICC line.   We strive to give you quality time with your provider. You may need to reschedule your appointment if you arrive late (15 or more minutes).  Arriving late affects you and other patients whose appointments are after yours.  Also, if you miss three or more appointments without notifying the office, you may be dismissed from the clinic at the provider's discretion.      For prescription refill requests, have your pharmacy contact our office and allow 72 hours for refills to be completed.    Today you received the following chemotherapy and/or immunotherapy agents Darzalex-Faspro      To help prevent nausea and vomiting after your treatment, we encourage you to take your nausea medication as directed.  BELOW ARE SYMPTOMS THAT SHOULD BE REPORTED IMMEDIATELY: *FEVER GREATER THAN 100.4 F (38 C) OR HIGHER *CHILLS OR SWEATING *NAUSEA AND VOMITING THAT IS NOT CONTROLLED WITH YOUR NAUSEA MEDICATION *UNUSUAL SHORTNESS OF BREATH *UNUSUAL BRUISING OR BLEEDING *URINARY PROBLEMS (pain or burning when urinating, or frequent urination) *BOWEL PROBLEMS (unusual diarrhea, constipation, pain near the anus) TENDERNESS IN MOUTH AND THROAT WITH OR WITHOUT PRESENCE OF ULCERS (sore throat, sores in mouth, or a toothache) UNUSUAL RASH, SWELLING OR PAIN  UNUSUAL VAGINAL DISCHARGE OR ITCHING   Items with * indicate a potential emergency and should be followed up as soon as possible or go to the Emergency Department if any problems should occur.  Please show the CHEMOTHERAPY ALERT CARD or  IMMUNOTHERAPY ALERT CARD at check-in to the Emergency Department and triage nurse.  Should you have questions after your visit or need to cancel or reschedule your appointment, please contact [facility] - [facility]PT OF [facility]  Dept: [phone]  and follow the prompts.  Office hours are 8:00 a.m. to 4:30 p.m. Monday - Friday. Please note that voicemails left after 4:00 p.m. may not be returned until the following business day.  We are closed weekends and major holidays. You have access to a nurse at all times for urgent questions. Please call the main number to the clinic Dept: [phone] and follow the prompts.   For any non-urgent questions, you may also contact your provider using MyChart. We now offer e-Visits for anyone 65 and older to request care online for non-urgent symptoms. For details visit [email or web address].   Also download the MyChart app! Go to the app store, search "MyChart", open the app, select [facility], and log in with your MyChart username and password.  Daratumumab; Hyaluronidase Injection What is this medication? DARATUMUMAB; HYALURONIDASE (dar a toom ue mab; hye al ur ON i dase) treats multiple myeloma, a type of bone marrow cancer. Daratumumab works by blocking a protein that causes cancer cells to grow and multiply. This helps to slow or stop the spread of cancer cells. Hyaluronidase works by increasing the absorption of other medications in the body to help them work better. This medication may also be used treat amyloidosis, a condition that causes the buildup of a protein (amyloid) in your body. It works by reducing  the buildup of this protein, which decreases symptoms. It is a combination medication that contains a monoclonal antibody. This medicine may be used for other purposes; ask your health care provider or pharmacist if you have questions. COMMON BRAND NAME(S): DARZALEX FASPRO What should I tell my care team before I take  this medication? They need to know if you have any of these conditions: Heart disease Infection, such as chickenpox, cold sores, herpes, hepatitis B Lung or breathing disease An unusual or allergic reaction to daratumumab, hyaluronidase, other medications, foods, dyes, or preservatives Pregnant or trying to get pregnant Breast-feeding How should I use this medication? This medication is injected under the skin. It is given by your care team in a hospital or clinic setting. Talk to your care team about the use of this medication in children. Special care may be needed. Overdosage: If you think you have taken too much of this medicine contact a poison control center or emergency room at once. NOTE: This medicine is only for you. Do not share this medicine with others. What if I miss a dose? Keep appointments for follow-up doses. It is important not to miss your dose. Call your care team if you are unable to keep an appointment. What may interact with this medication? Interactions have not been studied. This list may not describe all possible interactions. Give your health care provider a list of all the medicines, herbs, non-prescription drugs, or dietary supplements you use. Also tell them if you smoke, drink alcohol, or use illegal drugs. Some items may interact with your medicine. What should I watch for while using this medication? Your condition will be monitored carefully while you are receiving this medication. This medication can cause serious allergic reactions. To reduce your risk, your care team may give you other medication to take before receiving this one. Be sure to follow the directions from your care team. This medication can affect the results of blood tests to match your blood type. These changes can last for up to 6 months after the final dose. Your care team will do blood tests to match your blood type before you start treatment. Tell all of your care team that you are being  treated with this medication before receiving a blood transfusion. This medication can affect the results of some tests used to determine treatment response; extra tests may be needed to evaluate response. Talk to your care team if you wish to become pregnant or think you are pregnant. This medication can cause serious birth defects if taken during pregnancy and for 3 months after the last dose. A reliable form of contraception is recommended while taking this medication and for 3 months after the last dose. Talk to your care team about effective forms of contraception. Do not breast-feed while taking this medication. What side effects may I notice from receiving this medication? Side effects that you should report to your care team as soon as possible: Allergic reactions--skin rash, itching, hives, swelling of the face, lips, tongue, or throat Heart rhythm changes--fast or irregular heartbeat, dizziness, feeling faint or lightheaded, chest pain, trouble breathing Infection--fever, chills, cough, sore throat, wounds that don't heal, pain or trouble when passing urine, general feeling of discomfort or being unwell Infusion reactions--chest pain, shortness of breath or trouble breathing, feeling faint or lightheaded Sudden eye pain or change in vision such as blurry vision, seeing halos around lights, vision loss Unusual bruising or bleeding Side effects that usually do not require medical attention (report  to your care team if they continue or are bothersome): Constipation Diarrhea Fatigue Nausea Pain, tingling, or numbness in the hands or feet Swelling of the ankles, hands, or feet This list may not describe all possible side effects. Call your doctor for medical advice about side effects. You may report side effects to FDA at 1-800-FDA-1088. Where should I keep my medication? This medication is given in a hospital or clinic. It will not be stored at home. NOTE: This sheet is a summary. It may  not cover all possible information. If you have questions about this medicine, talk to your doctor, pharmacist, or health care provider.  2024 Elsevier/Gold Standard (2021-12-09 00:00:00)

## 2023-07-30 NOTE — Progress Notes (Signed)
[facility] OFFICE PROGRESS NOTE   Diagnosis: Multiple myeloma  INTERVAL HISTORY:   Jeffery Higgins returns as scheduled.  He completed another cycle of Revlimid/Decadron plus daratumumab beginning 07/02/2023.  He denies nausea/vomiting.  No mouth sores.  No diarrhea.  He reports having a mild rash on the left buttock, now resolved.  No wheezing, cough, shortness of breath.  He reports he is scheduled for a bone marrow biopsy at [facility] in January.  Objective:  Vital signs in last 24 hours:  Blood pressure 124/64, pulse 70, temperature 98.2 F (36.8 C), temperature source Temporal, resp. rate 18, height 5\' 10"  (1.778 m), weight 268 lb 12.8 oz (121.9 kg), SpO2 98%.    HEENT: No thrush or ulcers. Resp: Lungs clear bilaterally. Cardio: Regular rate and rhythm. GI: Abdomen soft and nontender.  No hepatosplenomegaly. Vascular: Trace lower leg edema bilaterally. Skin: No rash.   Lab Results:  Lab Results  Component Value Date   WBC 6.8 07/30/2023   HGB 13.5 07/30/2023   HCT 42.1 07/30/2023   MCV 94.4 07/30/2023   PLT 150 07/30/2023   NEUTROABS 3.7 07/30/2023    Imaging:  No results found.  Medications: I have reviewed the patient's current medications.  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 numerous enhancing lesions of the skull base and calvarium no evidence of extraosseous component 12/17/2022-MRI of the cervical, thoracic, and lumbar spine-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, clivus, bilateral occipital condyles, ribs, sacrum, and iliac  bone, no evidence of pathologic fracture, expansile lesion in C3 extends into the spinal canal by 4 mm with moderate to severe spinal canal stenosis posterior to C3, mild right foraminal narrowing at T2-T3 and T3-4 related to osseous tumor at T3 12/17/2022-SPEP-2.6 g serum M spike, IgG kappa Bone marrow biopsy 12/18/2022-multiple myeloma with plasma cells involving 60% of the cellular marrow, kappa restricted myeloma FISH panel-QNS Bone survey 12/18/2022-lucencies at the skull, right scapula, left clavicle, lumbar spine, pelvis, and left femoral neck.  Destructive lesion at C3, possible sclerotic lucent lesions in the thoracic spine 12/18/2022 pelvic radiation to C3 and humeral head-12/31/2022 Cycle 1 daratumumab-VRD 01/01/2023 (Velcade 3 weeks on/1 week off) Cycle 2 daratumumab-VRD 01/29/2023, Revlimid discontinued after 19 days due to a pruritic rash Cycle 3 daratumumab-VRD 02/26/2023, Revlimid dose reduced to 10 mg Cycle 4 daratumumab-VRD 03/26/2023, Revlimid 10 mg x 18 days, Dex changed to prednisone Monday Wednesday Friday due to rash; Revlimid placed on hold 04/08/2023 due to rash at the lower abdominal wall and pruritus Cycle 5 daratumumab/VRD 04/23/2023-patient declines to resume Revlimid due to rash/pruritus; Velcade placed on hold 04/23/2023 due to progressive neuropathy; proceeded with daratumumab and dexamethasone 04/28/2023 appointment with Dr. Melrose Nakayama [facility]-recommend continuing daratumumab and dexamethasone.  Would attempt to resume lenalidomide 5 mg days 1 through 21 with addition of daily famotidine and as needed topical steroids; escalate back to 10 mg if tolerated; dexamethasone 20 mg p.o. weekly.  Cycle length 28 days. Cycle 6 Revlimid/dexamethasone plus daratumumab 05/07/2023 (Revlimid 5 mg days 1 through 21, 28-day cycle; dexamethasone 10 mg weekly (unable to tolerate higher dose of dexamethasone due to emotional lability); Pepcid 20 mg daily). Zometa monthly during induction therapy then every 3 months x  2 years Cycle 7 Revlimid/Decadron plus daratumumab 06/04/2023 (Revlimid 5 mg days 1-21, 28-day cycle, Decadron 4 mg weekly (Decadron further reduced secondary to emotional lability) Cycle 8 Revlimid/Decadron plus daratumumab 07/02/2023 Cycle 9 Revlimid/Decadron plus daratumumab 07/30/2023 (he will discontinue home steroids)   2.  Left shoulder pain-likely secondary to a lytic lesion at the left humeral head/neck 3.  Left chin/distal mandible numbness-likely secondary to myeloma compressing nerve roots at the brainstem or left face 4.  Hypertension 5.  Diabetes 6.  Sleep apnea 7.  Nephrolithiasis-right ureteral calculus urinary tract infection, placement of a right ureter stent right stone dislodgment 10/01/2022 8.  Asthma 9.  Report of rash 04/08/2023-location and appearance 04/23/2023 consistent with skin change related to the Velcade injection.    Disposition: Jeffery Higgins appears stable.  He has completed 8 cycles of Revlimid/Decadron plus daratumumab.  Myeloma markers from last month showed normal serum light chains and IgG level, no M spike detected.  He saw Dr. Melrose Nakayama earlier this week.  Jeffery Higgins reports continuation of the current treatment is recommended, discontinuation of home Decadron.   A bone marrow biopsy is planned in January.  We will follow-up on Dr. Ross Ludwig note which is not available at this time.  He will begin a another cycle of Revlimid plus daratumumab today.  CBC and chemistry panel reviewed.  Labs adequate to proceed as above.  He will return for follow-up and treatment in 4 weeks.  We are available to see him sooner if needed.    Lonna Cobb ANP/GNP-BC   07/30/2023  9:28 AM

## 2023-07-30 NOTE — Progress Notes (Addendum)
Patient seen by Lonna Cobb NP today  Vitals are within treatment parameters:Yes   Labs are within treatment parameters: Yes   Treatment plan has been signed: Yes   Per physician team, Patient is ready for treatment and there are NO modifications to the treatment plan.  As per Ebbie Ridge. Maisie Fus, Zometa is not to be administered. The last dose of Zometa was given in November, and it is  administered every three months.

## 2023-07-30 NOTE — Telephone Encounter (Signed)
-----   Message from Lonna Cobb sent at 07/30/2023  2:33 PM EST ----- Please forward hemoglobin A1c result to his PCP ----- Message ----- From: Interface, Lab In [place] Sent: 07/30/2023   2:19 PM EST To: Rana Snare, NP

## 2023-08-21 ENCOUNTER — Other Ambulatory Visit: Payer: Self-pay | Admitting: Oncology

## 2023-08-23 ENCOUNTER — Other Ambulatory Visit: Payer: Self-pay | Admitting: *Deleted

## 2023-08-23 MED ORDER — LENALIDOMIDE 5 MG PO CAPS: 5.0000 mg | ORAL_CAPSULE | Freq: Every day | ORAL | 0 refills | Status: DC

## 2023-08-23 NOTE — Telephone Encounter (Signed)
 Received refill request from [facility] for lenalidomide.

## 2023-08-27 ENCOUNTER — Telehealth: Payer: Self-pay | Admitting: *Deleted

## 2023-08-27 ENCOUNTER — Inpatient Hospital Stay: Payer: Medicare Other | Attending: Nurse Practitioner

## 2023-08-27 ENCOUNTER — Ambulatory Visit (HOSPITAL_BASED_OUTPATIENT_CLINIC_OR_DEPARTMENT_OTHER)
Admission: RE | Admit: 2023-08-27 | Discharge: 2023-08-27 | Disposition: A | Discharge status: Home / Self Care | Payer: Medicare Other | Source: Ambulatory Visit | Attending: Oncology | Admitting: Oncology

## 2023-08-27 ENCOUNTER — Inpatient Hospital Stay: Payer: Medicare Other | Admitting: Oncology

## 2023-08-27 ENCOUNTER — Inpatient Hospital Stay: Payer: Medicare Other

## 2023-08-27 ENCOUNTER — Other Ambulatory Visit: Payer: Self-pay | Admitting: *Deleted

## 2023-08-27 VITALS — BP 154/79 | HR 78 | Temp 98.1°F | Resp 18 | Ht 70.0 in | Wt 264.2 lb

## 2023-08-27 DIAGNOSIS — Z5112 Encounter for antineoplastic immunotherapy: Secondary | ICD-10-CM | POA: Insufficient documentation

## 2023-08-27 DIAGNOSIS — C9000 Multiple myeloma not having achieved remission: Secondary | ICD-10-CM

## 2023-08-27 DIAGNOSIS — G4730 Sleep apnea, unspecified: Secondary | ICD-10-CM | POA: Insufficient documentation

## 2023-08-27 DIAGNOSIS — E1140 Type 2 diabetes mellitus with diabetic neuropathy, unspecified: Secondary | ICD-10-CM | POA: Insufficient documentation

## 2023-08-27 DIAGNOSIS — I10 Essential (primary) hypertension: Secondary | ICD-10-CM | POA: Insufficient documentation

## 2023-08-27 DIAGNOSIS — Z87440 Personal history of urinary (tract) infections: Secondary | ICD-10-CM | POA: Diagnosis not present

## 2023-08-27 DIAGNOSIS — M25512 Pain in left shoulder: Secondary | ICD-10-CM | POA: Diagnosis not present

## 2023-08-27 DIAGNOSIS — J45909 Unspecified asthma, uncomplicated: Secondary | ICD-10-CM | POA: Diagnosis not present

## 2023-08-27 LAB — CMP (CANCER CENTER ONLY)
ALT: 14 U/L (ref 0–44)
AST: 13 U/L — ABNORMAL LOW (ref 15–41)
Albumin: 4 g/dL (ref 3.5–5.0)
Alkaline Phosphatase: 52 U/L (ref 38–126)
Anion gap: 7 (ref 5–15)
BUN: 17 mg/dL (ref 8–23)
CO2: 26 mmol/L (ref 22–32)
Calcium: 9 mg/dL (ref 8.9–10.3)
Chloride: 106 mmol/L (ref 98–111)
Creatinine: 0.97 mg/dL (ref 0.61–1.24)
GFR, Estimated: >60 mL/min (ref >60)
Glucose, Bld: 203 mg/dL — ABNORMAL HIGH (ref 70–99)
Potassium: 4.1 mmol/L (ref 3.5–5.1)
Sodium: 139 mmol/L (ref 135–145)
Total Bilirubin: 0.4 mg/dL (ref 0.0–1.2)
Total Protein: 6.4 g/dL — ABNORMAL LOW (ref 6.5–8.1)

## 2023-08-27 LAB — CBC WITH DIFFERENTIAL (CANCER CENTER ONLY)
Abs Immature Granulocytes: 0.01 10*3/uL (ref 0.00–0.07)
Basophils Absolute: 0.1 10*3/uL (ref 0.0–0.1)
Basophils Relative: 1 %
Eosinophils Absolute: 0.3 10*3/uL (ref 0.0–0.5)
Eosinophils Relative: 5 %
HCT: 43.2 % (ref 39.0–52.0)
Hemoglobin: 14.4 g/dL (ref 13.0–17.0)
Immature Granulocytes: 0 %
Lymphocytes Relative: 29 %
Lymphs Abs: 1.9 10*3/uL (ref 0.7–4.0)
MCH: 30.2 pg (ref 26.0–34.0)
MCHC: 33.3 g/dL (ref 30.0–36.0)
MCV: 90.6 fL (ref 80.0–100.0)
Monocytes Absolute: 0.8 10*3/uL (ref 0.1–1.0)
Monocytes Relative: 12 %
Neutro Abs: 3.6 10*3/uL (ref 1.7–7.7)
Neutrophils Relative %: 53 %
Platelet Count: 259 10*3/uL (ref 150–400)
RBC: 4.77 MIL/uL (ref 4.22–5.81)
RDW: 13.7 % (ref 11.5–15.5)
WBC Count: 6.7 10*3/uL (ref 4.0–10.5)
nRBC: 0 % (ref 0.0–0.2)

## 2023-08-27 MED ORDER — DARATUMUMAB-HYALURONIDASE-FIHJ 1800-30000 MG-UT/15ML ~~LOC~~ SOLN
1800.0000 mg | Freq: Once | SUBCUTANEOUS | Status: AC
Start: 2023-08-27 — End: 2023-08-27
  Administered 2023-08-27: 1800 mg via SUBCUTANEOUS
  Filled 2023-08-27: qty 15

## 2023-08-27 MED ORDER — ACETAMINOPHEN 325 MG PO TABS
650.0000 mg | ORAL_TABLET | Freq: Once | ORAL | Status: AC
  Administered 2023-08-27: 650 mg via ORAL
  Filled 2023-08-27: qty 2

## 2023-08-27 MED ORDER — DIPHENHYDRAMINE HCL 25 MG PO CAPS
25.0000 mg | ORAL_CAPSULE | Freq: Once | ORAL | Status: AC
  Administered 2023-08-27: 25 mg via ORAL
  Filled 2023-08-27: qty 1

## 2023-08-27 MED ORDER — DEXAMETHASONE 4 MG PO TABS
4.0000 mg | ORAL_TABLET | Freq: Once | ORAL | Status: AC
  Administered 2023-08-27: 4 mg via ORAL
  Filled 2023-08-27: qty 1

## 2023-08-27 NOTE — Progress Notes (Signed)
 [facility] OFFICE PROGRESS NOTE   Diagnosis: Multiple myeloma  INTERVAL HISTORY:   Jeffery Higgins returns as scheduled.  He is due to begin another cycle of lenalidomide  today.  He continues monthly daratumumab /Decadron .  No nausea or diarrhea.  He developed a cough and nasal/sinus congestion beginning 2 weeks ago.  He reports being evaluated in urgent care and diagnosed with a viral infection.  He says a chest x-ray was negative for pneumonia. He saw Dr. Russella on 07/28/2023.  Dr. Russella recommends continuing daratumumab , lenalidomide , and Decadron . Jeffery Higgins reports mild exertional dyspnea and a persistent cough.  He has pain in the left upper abdomen/subcostal region for the past several days.  No fever.  He has persistent neuropathy symptoms in the feet.  Objective:  Vital signs in last 24 hours:  Blood pressure (!) 154/79, pulse 78, temperature 98.1 F (36.7 C), temperature source Temporal, resp. rate 18, height 5' 10 (1.778 m), weight 264 lb 3.2 oz (119.8 kg), SpO2 96%.    HEENT: No thrush Resp: End inspiratory rhonchi at the lower posterior chest bilaterally, no respiratory distress Cardio: Regular rate and rhythm GI: No hepatosplenomegaly, no mass, mild tenderness in the left upper lateral abdomen Vascular: Trace lower leg edema on the right greater than left  Lab Results:  Lab Results  Component Value Date   WBC 6.7 08/27/2023   HGB 14.4 08/27/2023   HCT 43.2 08/27/2023   MCV 90.6 08/27/2023   PLT 259 08/27/2023   NEUTROABS 3.6 08/27/2023    CMP  Lab Results  Component Value Date   NA 139 08/27/2023   K 4.1 08/27/2023   CL 106 08/27/2023   CO2 26 08/27/2023   GLUCOSE 203 (H) 08/27/2023   BUN 17 08/27/2023   CREATININE 0.97 08/27/2023   CALCIUM 9.0 08/27/2023   PROT 6.4 (L) 08/27/2023   ALBUMIN 4.0 08/27/2023   AST 13 (L) 08/27/2023   ALT 14 08/27/2023   ALKPHOS 52 08/27/2023   BILITOT 0.4 08/27/2023   GFRNONAA >60 08/27/2023    GFRAA >90 08/02/2012    No results found for: CEA1, CEA, CAN199, CA125  No results found for: INR, LABPROT  Imaging:  No results found.  Medications: I have reviewed the patient's current medications.   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 numerous enhancing lesions of the skull base and calvarium no evidence of extraosseous component 12/17/2022-MRI of the cervical, thoracic, and lumbar spine-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, clivus, bilateral occipital condyles, ribs, sacrum, and iliac bone, no evidence of pathologic fracture, expansile lesion in C3 extends into the spinal canal by 4 mm with moderate to severe spinal canal stenosis posterior to C3, mild right foraminal narrowing at T2-T3 and T3-4 related to osseous tumor at T3 12/17/2022-SPEP-2.6 g serum M spike, IgG kappa Bone marrow biopsy 12/18/2022-multiple myeloma with plasma cells involving 60% of the cellular marrow, kappa restricted myeloma FISH panel-QNS Bone survey 12/18/2022-lucencies at the skull, right scapula, left clavicle, lumbar spine, pelvis, and left femoral neck.  Destructive lesion at C3, possible sclerotic lucent lesions in the thoracic spine 12/18/2022 pelvic radiation to C3 and humeral head-12/31/2022 Cycle 1 daratumumab -VRD 01/01/2023 (Velcade  3 weeks on/1 week off) Cycle  2 daratumumab -VRD 01/29/2023, Revlimid  discontinued after 19 days due to a pruritic rash Cycle 3 daratumumab -VRD 02/26/2023, Revlimid  dose reduced to 10 mg Cycle 4 daratumumab -VRD 03/26/2023, Revlimid  10 mg x 18 days, Dex  changed to prednisone  Monday Wednesday Friday due to rash; Revlimid  placed on hold 04/08/2023 due to rash at the lower abdominal wall and pruritus Cycle 5 daratumumab /VRD 04/23/2023-patient declines to resume Revlimid  due to rash/pruritus; Velcade  placed on hold 04/23/2023 due to progressive neuropathy; proceeded with daratumumab  and dexamethasone  04/28/2023 appointment with Dr. Russella [facility]-recommend continuing daratumumab  and dexamethasone .  Would attempt to resume lenalidomide  5 mg days 1 through 21 with addition of daily famotidine  and as needed topical steroids; escalate back to 10 mg if tolerated; dexamethasone  20 mg p.o. weekly.  Cycle length 28 days. Cycle 6 Revlimid /dexamethasone  plus daratumumab  05/07/2023 (Revlimid  5 mg days 1 through 21, 28-day cycle; dexamethasone  10 mg weekly (unable to tolerate higher dose of dexamethasone  due to emotional lability); Pepcid  20 mg daily). Zometa  monthly during induction therapy then every 3 months x 2 years Cycle 7 Revlimid /Decadron  plus daratumumab  06/04/2023 (Revlimid  5 mg days 1-21, 28-day cycle, Decadron  4 mg weekly (Decadron  further reduced secondary to emotional lability) Cycle 8 Revlimid /Decadron  plus daratumumab  07/02/2023 Cycle 9 Revlimid /Decadron  plus daratumumab  07/30/2023 (he will discontinue home steroids) [facility]el[facility] 07/28/2023, IgG kappa protein, M spike too low to quantify Cycle 10 Revlimid /Decadron  plus daratumumab  08/27/2023   2.  Left shoulder pain-likely secondary to a lytic lesion at the left humeral head/neck 3.  Left chin/distal mandible numbness-likely secondary to myeloma compressing nerve roots at the brainstem or left face 4.  Hypertension 5.  Diabetes 6.  Sleep apnea 7.  Nephrolithiasis-right ureteral calculus urinary tract infection, placement of a right ureter stent right stone dislodgment 10/01/2022 8.  Asthma 9.  Report of rash 04/08/2023-location and appearance 04/23/2023 consistent with skin change related to the Velcade   injection.      Disposition: Jeffery Higgins continues to tolerate the daratumumab /Revlimid  well.  He will begin another cycle today.  He saw Dr. Russella last month.  The plan is to continue the current treatment.  He has a low level serum monoclonal protein.  He will return for an office visit, daratumumab , and Zometa  in 1 month.  We will follow-up on the myeloma panel from today.  He has an upper respiratory infection.  I have a low clinical suspicion for pneumonia, but given the abnormal physical exam findings we will refer him for a chest x-ray.  He will call for a fever or increased shortness of breath.  The left subcostal discomfort is likely related to a benign musculoskeletal condition.  Arley Hof, MD  08/27/2023  8:40 AM

## 2023-08-27 NOTE — Patient Instructions (Signed)
 [facility]   Discharge Instructions: Thank you for choosing [facility] to provide your oncology and hematology care.   If you have a lab appointment with the Cancer Center, please go directly to the Cancer Center and check in at the registration area.   Wear comfortable clothing and clothing appropriate for easy access to any Portacath or PICC line.   We strive to give you quality time with your provider. You may need to reschedule your appointment if you arrive late (15 or more minutes).  Arriving late affects you and other patients whose appointments are after yours.  Also, if you miss three or more appointments without notifying the office, you may be dismissed from the clinic at the provider's discretion.      For prescription refill requests, have your pharmacy contact our office and allow 72 hours for refills to be completed.    Today you received the following chemotherapy and/or immunotherapy agents DARZALEX-FASPRO      To help prevent nausea and vomiting after your treatment, we encourage you to take your nausea medication as directed.  BELOW ARE SYMPTOMS THAT SHOULD BE REPORTED IMMEDIATELY: *FEVER GREATER THAN 100.4 F (38 C) OR HIGHER *CHILLS OR SWEATING *NAUSEA AND VOMITING THAT IS NOT CONTROLLED WITH YOUR NAUSEA MEDICATION *UNUSUAL SHORTNESS OF BREATH *UNUSUAL BRUISING OR BLEEDING *URINARY PROBLEMS (pain or burning when urinating, or frequent urination) *BOWEL PROBLEMS (unusual diarrhea, constipation, pain near the anus) TENDERNESS IN MOUTH AND THROAT WITH OR WITHOUT PRESENCE OF ULCERS (sore throat, sores in mouth, or a toothache) UNUSUAL RASH, SWELLING OR PAIN  UNUSUAL VAGINAL DISCHARGE OR ITCHING   Items with * indicate a potential emergency and should be followed up as soon as possible or go to the Emergency Department if any problems should occur.  Please show the CHEMOTHERAPY ALERT CARD or  IMMUNOTHERAPY ALERT CARD at check-in to the Emergency Department and triage nurse.  Should you have questions after your visit or need to cancel or reschedule your appointment, please contact [facility]  Dept: [phone]  and follow the prompts.  Office hours are 8:00 a.m. to 4:30 p.m. Monday - Friday. Please note that voicemails left after 4:00 p.m. may not be returned until the following business day.  We are closed weekends and major holidays. You have access to a nurse at all times for urgent questions. Please call the main number to the clinic Dept: [phone] and follow the prompts.   For any non-urgent questions, you may also contact your provider using MyChart. We now offer e-Visits for anyone 79 and older to request care online for non-urgent symptoms. For details visit [email or web address].   Also download the MyChart app! Go to the app store, search "MyChart", open the app, select [facility], and log in with your MyChart username and password.  Daratumumab; Hyaluronidase Injection What is this medication? DARATUMUMAB; HYALURONIDASE (dar a toom ue mab; hye al ur ON i dase) treats multiple myeloma, a type of bone marrow cancer. Daratumumab works by blocking a protein that causes cancer cells to grow and multiply. This helps to slow or stop the spread of cancer cells. Hyaluronidase works by increasing the absorption of other medications in the body to help them work better. This medication may also be used treat amyloidosis, a condition that causes the buildup of a protein (amyloid) in your body. It works by  reducing the buildup of this protein, which decreases symptoms. It is a combination medication that contains a monoclonal antibody. This medicine may be used for other purposes; ask your health care provider or pharmacist if you have questions. COMMON BRAND NAME(S): DARZALEX FASPRO What should I tell my care team before I take  this medication? They need to know if you have any of these conditions: Heart disease Infection, such as chickenpox, cold sores, herpes, hepatitis B Lung or breathing disease An unusual or allergic reaction to daratumumab, hyaluronidase, other medications, foods, dyes, or preservatives Pregnant or trying to get pregnant Breast-feeding How should I use this medication? This medication is injected under the skin. It is given by your care team in a hospital or clinic setting. Talk to your care team about the use of this medication in children. Special care may be needed. Overdosage: If you think you have taken too much of this medicine contact a poison control center or emergency room at once. NOTE: This medicine is only for you. Do not share this medicine with others. What if I miss a dose? Keep appointments for follow-up doses. It is important not to miss your dose. Call your care team if you are unable to keep an appointment. What may interact with this medication? Interactions have not been studied. This list may not describe all possible interactions. Give your health care provider a list of all the medicines, herbs, non-prescription drugs, or dietary supplements you use. Also tell them if you smoke, drink alcohol, or use illegal drugs. Some items may interact with your medicine. What should I watch for while using this medication? Your condition will be monitored carefully while you are receiving this medication. This medication can cause serious allergic reactions. To reduce your risk, your care team may give you other medication to take before receiving this one. Be sure to follow the directions from your care team. This medication can affect the results of blood tests to match your blood type. These changes can last for up to 6 months after the final dose. Your care team will do blood tests to match your blood type before you start treatment. Tell all of your care team that you are being  treated with this medication before receiving a blood transfusion. This medication can affect the results of some tests used to determine treatment response; extra tests may be needed to evaluate response. Talk to your care team if you wish to become pregnant or think you are pregnant. This medication can cause serious birth defects if taken during pregnancy and for 3 months after the last dose. A reliable form of contraception is recommended while taking this medication and for 3 months after the last dose. Talk to your care team about effective forms of contraception. Do not breast-feed while taking this medication. What side effects may I notice from receiving this medication? Side effects that you should report to your care team as soon as possible: Allergic reactions--skin rash, itching, hives, swelling of the face, lips, tongue, or throat Heart rhythm changes--fast or irregular heartbeat, dizziness, feeling faint or lightheaded, chest pain, trouble breathing Infection--fever, chills, cough, sore throat, wounds that don't heal, pain or trouble when passing urine, general feeling of discomfort or being unwell Infusion reactions--chest pain, shortness of breath or trouble breathing, feeling faint or lightheaded Sudden eye pain or change in vision such as blurry vision, seeing halos around lights, vision loss Unusual bruising or bleeding Side effects that usually do not require medical attention (  report to your care team if they continue or are bothersome): Constipation Diarrhea Fatigue Nausea Pain, tingling, or numbness in the hands or feet Swelling of the ankles, hands, or feet This list may not describe all possible side effects. Call your doctor for medical advice about side effects. You may report side effects to FDA at 1-800-FDA-1088. Where should I keep my medication? This medication is given in a hospital or clinic. It will not be stored at home. NOTE: This sheet is a summary. It may  not cover all possible information. If you have questions about this medicine, talk to your doctor, pharmacist, or health care provider.  2024 Elsevier/Gold Standard (2021-12-09 00:00:00)

## 2023-08-27 NOTE — Progress Notes (Signed)
 Patient seen by Dr. Arley Hof today  Vitals are within treatment parameters:Yes   Labs are within treatment parameters: Yes   Treatment plan has been signed: Yes   Per physician team, Patient is ready for treatment and there are NO modifications to the treatment plan.  Zometa  is now every 3 months (not needed today)

## 2023-08-27 NOTE — Telephone Encounter (Signed)
 Notified Jeffery Higgins that his CXR was negative for pneumonia. Call for fever or any increased dyspnea. F/U as scheduled. He agrees to plan.

## 2023-08-27 NOTE — Telephone Encounter (Signed)
-----   Message from Thornton Papas sent at 08/27/2023  1:57 PM EST ----- Please call patient, the chest x-ray is negative for pneumonia, call for a fever or increased dyspnea, follow-up as scheduled

## 2023-08-29 LAB — IGG: IgG (Immunoglobin G), Serum: 881 mg/dL (ref 603–1613)

## 2023-08-30 LAB — KAPPA/LAMBDA LIGHT CHAINS
Kappa free light chain: 16.6 mg/L (ref 3.3–19.4)
Kappa, lambda light chain ratio: 1.42 (ref 0.26–1.65)
Lambda free light chains: 11.7 mg/L (ref 5.7–26.3)

## 2023-08-31 LAB — PROTEIN ELECTROPHORESIS, SERUM
A/G Ratio: 1.5 (ref 0.7–1.7)
Albumin ELP: 3.5 g/dL (ref 2.9–4.4)
Alpha-1-Globulin: 0.2 g/dL (ref 0.0–0.4)
Alpha-2-Globulin: 0.7 g/dL (ref 0.4–1.0)
Beta Globulin: 0.7 g/dL (ref 0.7–1.3)
Gamma Globulin: 0.7 g/dL (ref 0.4–1.8)
Globulin, Total: 2.4 g/dL (ref 2.2–3.9)
Total Protein ELP: 5.9 g/dL — ABNORMAL LOW (ref 6.0–8.5)

## 2023-09-15 ENCOUNTER — Other Ambulatory Visit: Payer: Self-pay | Admitting: *Deleted

## 2023-09-15 MED ORDER — LENALIDOMIDE 5 MG PO CAPS: 5.0000 mg | ORAL_CAPSULE | Freq: Every day | ORAL | 0 refills | Status: DC

## 2023-09-15 NOTE — Telephone Encounter (Signed)
[facility] call to request refill on lenalidomide.

## 2023-09-19 ENCOUNTER — Other Ambulatory Visit: Payer: Self-pay | Admitting: Oncology

## 2023-09-24 ENCOUNTER — Inpatient Hospital Stay: Payer: Medicare Other | Attending: Nurse Practitioner

## 2023-09-24 ENCOUNTER — Ambulatory Visit: Payer: Medicare Other | Admitting: Nurse Practitioner

## 2023-09-24 ENCOUNTER — Inpatient Hospital Stay: Payer: Medicare Other | Admitting: Oncology

## 2023-09-24 ENCOUNTER — Other Ambulatory Visit: Payer: Medicare Other

## 2023-09-24 ENCOUNTER — Inpatient Hospital Stay: Payer: Medicare Other

## 2023-09-24 ENCOUNTER — Ambulatory Visit: Payer: Medicare Other

## 2023-09-24 VITALS — BP 116/69 | HR 85 | Temp 98.1°F | Resp 18 | Ht 70.0 in | Wt 263.9 lb

## 2023-09-24 DIAGNOSIS — J45909 Unspecified asthma, uncomplicated: Secondary | ICD-10-CM | POA: Insufficient documentation

## 2023-09-24 DIAGNOSIS — I10 Essential (primary) hypertension: Secondary | ICD-10-CM | POA: Insufficient documentation

## 2023-09-24 DIAGNOSIS — G4730 Sleep apnea, unspecified: Secondary | ICD-10-CM | POA: Insufficient documentation

## 2023-09-24 DIAGNOSIS — Z5112 Encounter for antineoplastic immunotherapy: Secondary | ICD-10-CM | POA: Insufficient documentation

## 2023-09-24 DIAGNOSIS — M25512 Pain in left shoulder: Secondary | ICD-10-CM | POA: Insufficient documentation

## 2023-09-24 DIAGNOSIS — N200 Calculus of kidney: Secondary | ICD-10-CM | POA: Diagnosis not present

## 2023-09-24 DIAGNOSIS — R21 Rash and other nonspecific skin eruption: Secondary | ICD-10-CM | POA: Diagnosis not present

## 2023-09-24 DIAGNOSIS — C9000 Multiple myeloma not having achieved remission: Secondary | ICD-10-CM

## 2023-09-24 DIAGNOSIS — E1140 Type 2 diabetes mellitus with diabetic neuropathy, unspecified: Secondary | ICD-10-CM | POA: Diagnosis not present

## 2023-09-24 LAB — CMP (CANCER CENTER ONLY)
ALT: 14 U/L (ref 0–44)
AST: 12 U/L — ABNORMAL LOW (ref 15–41)
Albumin: 3.9 g/dL (ref 3.5–5.0)
Alkaline Phosphatase: 47 U/L (ref 38–126)
Anion gap: 6 (ref 5–15)
BUN: 17 mg/dL (ref 8–23)
CO2: 28 mmol/L (ref 22–32)
Calcium: 9.2 mg/dL (ref 8.9–10.3)
Chloride: 105 mmol/L (ref 98–111)
Creatinine: 1 mg/dL (ref 0.61–1.24)
GFR, Estimated: >60 mL/min (ref >60)
Glucose, Bld: 177 mg/dL — ABNORMAL HIGH (ref 70–99)
Potassium: 4.4 mmol/L (ref 3.5–5.1)
Sodium: 139 mmol/L (ref 135–145)
Total Bilirubin: 0.5 mg/dL (ref 0.0–1.2)
Total Protein: 6.5 g/dL (ref 6.5–8.1)

## 2023-09-24 LAB — CBC WITH DIFFERENTIAL (CANCER CENTER ONLY)
Abs Immature Granulocytes: 0.01 10*3/uL (ref 0.00–0.07)
Basophils Absolute: 0.1 10*3/uL (ref 0.0–0.1)
Basophils Relative: 1 %
Eosinophils Absolute: 0.3 10*3/uL (ref 0.0–0.5)
Eosinophils Relative: 4 %
HCT: 47 % (ref 39.0–52.0)
Hemoglobin: 15.5 g/dL (ref 13.0–17.0)
Immature Granulocytes: 0 %
Lymphocytes Relative: 34 %
Lymphs Abs: 2.5 10*3/uL (ref 0.7–4.0)
MCH: 30 pg (ref 26.0–34.0)
MCHC: 33 g/dL (ref 30.0–36.0)
MCV: 91.1 fL (ref 80.0–100.0)
Monocytes Absolute: 0.8 10*3/uL (ref 0.1–1.0)
Monocytes Relative: 11 %
Neutro Abs: 3.7 10*3/uL (ref 1.7–7.7)
Neutrophils Relative %: 50 %
Platelet Count: 169 10*3/uL (ref 150–400)
RBC: 5.16 MIL/uL (ref 4.22–5.81)
RDW: 14.6 % (ref 11.5–15.5)
WBC Count: 7.4 10*3/uL (ref 4.0–10.5)
nRBC: 0 % (ref 0.0–0.2)

## 2023-09-24 MED ORDER — SODIUM CHLORIDE 0.9 % IV SOLN: INTRAVENOUS | Status: DC

## 2023-09-24 MED ORDER — DIPHENHYDRAMINE HCL 25 MG PO CAPS
25.0000 mg | ORAL_CAPSULE | Freq: Once | ORAL | Status: AC
  Administered 2023-09-24: 25 mg via ORAL
  Filled 2023-09-24: qty 1

## 2023-09-24 MED ORDER — DARATUMUMAB-HYALURONIDASE-FIHJ 1800-30000 MG-UT/15ML ~~LOC~~ SOLN
1800.0000 mg | Freq: Once | SUBCUTANEOUS | Status: AC
  Administered 2023-09-24: 1800 mg via SUBCUTANEOUS
  Filled 2023-09-24: qty 15

## 2023-09-24 MED ORDER — ACETAMINOPHEN 325 MG PO TABS
650.0000 mg | ORAL_TABLET | Freq: Once | ORAL | Status: AC
  Administered 2023-09-24: 650 mg via ORAL
  Filled 2023-09-24: qty 2

## 2023-09-24 MED ORDER — ZOLEDRONIC ACID 4 MG/100ML IV SOLN
4.0000 mg | Freq: Once | INTRAVENOUS | Status: AC
  Administered 2023-09-24: 4 mg via INTRAVENOUS
  Filled 2023-09-24: qty 100

## 2023-09-24 MED ORDER — DEXAMETHASONE 4 MG PO TABS
4.0000 mg | ORAL_TABLET | Freq: Once | ORAL | Status: AC
  Administered 2023-09-24: 4 mg via ORAL
  Filled 2023-09-24: qty 1

## 2023-09-24 NOTE — Progress Notes (Signed)
 [facility] OFFICE PROGRESS NOTE   Diagnosis: Multiple myeloma  INTERVAL HISTORY:   [facility] as scheduled.  He completed another cycle of Revlimid  beginning 08/27/2023.  No rash or diarrhea.  He has persistent discomfort in the low back.  He had an upper respiratory infection last month.  No other complaint.  He is scheduled for a restaging bone marrow at [facility] next week.  Objective:  Vital signs in last 24 hours:  Blood pressure 116/69, pulse 85, temperature 98.1 F (36.7 C), temperature source Temporal, resp. rate 18, height 5' 10 (1.778 m), weight 263 lb 14.4 oz (119.7 kg), SpO2 98%.    HEENT: No thrush or ulcers Resp: Decreased breath sounds with coarse end inspiratory rhonchi at the lower posterior chest bilaterally, no respiratory distress Cardio: Regular rate and rhythm GI: No hepatosplenomegaly Vascular: Edema   Lab Results:  Lab Results  Component Value Date   WBC 7.4 09/24/2023   HGB 15.5 09/24/2023   HCT 47.0 09/24/2023   MCV 91.1 09/24/2023   PLT 169 09/24/2023   NEUTROABS 3.7 09/24/2023    CMP  Lab Results  Component Value Date   NA 139 08/27/2023   K 4.1 08/27/2023   CL 106 08/27/2023   CO2 26 08/27/2023   GLUCOSE 203 (H) 08/27/2023   BUN 17 08/27/2023   CREATININE 0.97 08/27/2023   CALCIUM 9.0 08/27/2023   PROT 6.4 (L) 08/27/2023   ALBUMIN 4.0 08/27/2023   AST 13 (L) 08/27/2023   ALT 14 08/27/2023   ALKPHOS 52 08/27/2023   BILITOT 0.4 08/27/2023   GFRNONAA >60 08/27/2023   GFRAA >90 08/02/2012    Medications: I have reviewed the patient's current medications.   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 numerous enhancing lesions of the skull base and calvarium no evidence of extraosseous component 12/17/2022-MRI of the cervical, thoracic, and lumbar spine-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, clivus, bilateral occipital condyles, ribs, sacrum, and iliac bone, no evidence of pathologic fracture, expansile lesion in C3 extends into the spinal canal by 4 mm with moderate to severe spinal canal stenosis posterior to C3, mild right foraminal narrowing at T2-T3 and T3-4 related to osseous tumor at T3 12/17/2022-SPEP-2.6 g serum M spike, IgG kappa Bone marrow biopsy 12/18/2022-multiple myeloma with plasma cells involving 60% of the cellular marrow, kappa restricted myeloma FISH panel-QNS Bone survey 12/18/2022-lucencies at the skull, right scapula, left clavicle, lumbar spine, pelvis, and left femoral neck.  Destructive lesion at C3, possible sclerotic lucent lesions in the thoracic spine 12/18/2022 pelvic radiation to C3 and humeral head-12/31/2022 Cycle 1 daratumumab -VRD 01/01/2023 (Velcade  3 weeks on/1 week off) Cycle 2 daratumumab -VRD 01/29/2023, Revlimid  discontinued after 19 days due to a pruritic rash Cycle 3 daratumumab -VRD 02/26/2023, Revlimid  dose reduced to 10 mg Cycle 4 daratumumab -VRD 03/26/2023, Revlimid  10 mg x 18 days, Dex changed to prednisone  Monday Wednesday Friday due to rash; Revlimid  placed on hold 04/08/2023 due to rash at the lower abdominal wall and pruritus Cycle 5 daratumumab /VRD 04/23/2023-patient declines to resume Revlimid  due to rash/pruritus; Velcade  placed on hold 04/23/2023 due to progressive neuropathy; proceeded with daratumumab  and dexamethasone  04/28/2023 appointment with Dr. Russella [facility]-recommend continuing daratumumab  and dexamethasone .  Would  attempt to resume lenalidomide  5 mg days 1 through 21 with addition of daily famotidine  and as needed topical steroids; escalate back to  10 mg if tolerated; dexamethasone  20 mg p.o. weekly.  Cycle length 28 days. Cycle 6 Revlimid /dexamethasone  plus daratumumab  05/07/2023 (Revlimid  5 mg days 1 through 21, 28-day cycle; dexamethasone  10 mg weekly (unable to tolerate higher dose of dexamethasone  due to emotional lability); Pepcid  20 mg daily). Zometa  monthly during induction therapy then every 3 months x 2 years Cycle 7 Revlimid /Decadron  plus daratumumab  06/04/2023 (Revlimid  5 mg days 1-21, 28-day cycle, Decadron  4 mg weekly (Decadron  further reduced secondary to emotional lability) Cycle 8 Revlimid /Decadron  plus daratumumab  07/02/2023 Cycle 9 Revlimid /Decadron  plus daratumumab  07/30/2023 (he will discontinue home steroids) Myel[facility] at [facility] 07/28/2023, IgG kappa protein, M spike too low to quantify Cycle 10 Revlimid /Decadron  plus daratumumab  08/27/2023 Cycle 11 Revlimid /daratumumab -09/24/2023, Revlimid  starting 09/25/2023   2.  Left shoulder pain-likely secondary to a lytic lesion at the left humeral head/neck 3.  Left chin/distal mandible numbness-likely secondary to myeloma compressing nerve roots at the brainstem or left face 4.  Hypertension 5.  Diabetes 6.  Sleep apnea 7.  Nephrolithiasis-right ureteral calculus urinary tract infection, placement of a right ureter stent right stone dislodgment 10/01/2022 8.  Asthma 9.  Report of rash 04/08/2023-location and appearance 04/23/2023 consistent with skin change related to the Velcade  injection.       Disposition: [facility] appears stable.  He continues treatment with Revlimid  and daratumumab /Decadron .  He will begin another cycle of Revlimid  tomorrow.  He is scheduled to receive daratumumab  today.  The IgG and light chains were normal last month.  No serum M spike was detected.  He is scheduled for a restaging bone marrow at [facility] next week.  He will return for an office visit and daratumumab  in 4 weeks.  Arley Hof, MD  09/24/2023  10:49 AM

## 2023-09-24 NOTE — Patient Instructions (Signed)
 [facility] A DEPT OF [facility]   Discharge Instructions: Thank you for choosing [facility] to provide your oncology and hematology care.   If you have a lab appointment with the [facility], please go directly to the Cancer [facility] and check in at the registration area.   Wear comfortable clothing and clothing appropriate for easy access to any Portacath or PICC line.   We strive to give you quality time with your provider. You may need to reschedule your appointment if you arrive late (15 or more minutes).  Arriving late affects you and other patients whose appointments are after yours.  Also, if you miss three or more appointments without notifying the office, you may be dismissed from the clinic at the provider's discretion.      For prescription refill requests, have your pharmacy contact our office and allow 72 hours for refills to be completed.    Today you received the following chemotherapy and/or immunotherapy agents Darzalex  Faspro/Zometa       To help prevent nausea and vomiting after your treatment, we encourage you to take your nausea medication as directed.  BELOW ARE SYMPTOMS THAT SHOULD BE REPORTED IMMEDIATELY: *FEVER GREATER THAN 100.4 F (38 C) OR HIGHER *CHILLS OR SWEATING *NAUSEA AND VOMITING THAT IS NOT CONTROLLED WITH YOUR NAUSEA MEDICATION *UNUSUAL SHORTNESS OF BREATH *UNUSUAL BRUISING OR BLEEDING *URINARY PROBLEMS (pain or burning when urinating, or frequent urination) *BOWEL PROBLEMS (unusual diarrhea, constipation, pain near the anus) TENDERNESS IN MOUTH AND THROAT WITH OR WITHOUT PRESENCE OF ULCERS (sore throat, sores in mouth, or a toothache) UNUSUAL RASH, SWELLING OR PAIN  UNUSUAL VAGINAL DISCHARGE OR ITCHING   Items with * indicate a potential emergency and should be followed up as soon as possible or go to the Emergency Department if any problems should occur.  Please show the CHEMOTHERAPY ALERT CARD or  IMMUNOTHERAPY ALERT CARD at check-in to the Emergency Department and triage nurse.  Should you have questions after your visit or need to cancel or reschedule your appointment, please contact [facility] - A DEPT OF [facility]  Dept: [phone]  and follow the prompts.  Office hours are 8:00 a.m. to 4:30 p.m. Monday - Friday. Please note that voicemails left after 4:00 p.m. may not be returned until the following business day.  We are closed weekends and major holidays. You have access to a nurse at all times for urgent questions. Please call the main number to the clinic Dept: [phone] and follow the prompts.   For any non-urgent questions, you may also contact your provider using MyChart. We now offer e-Visits for anyone 35 and older to request care online for non-urgent symptoms. For details visit [email or web address].   Also download the MyChart app! Go to the app store, search MyChart, open the app, select [facility], and log in with your MyChart username and password.  Daratumumab ; Hyaluronidase  Injection What is this medication? DARATUMUMAB ; HYALURONIDASE  (dar a toom ue mab; hye al ur ON i dase) treats multiple myeloma, a type of bone marrow cancer. Daratumumab  works by blocking a protein that causes cancer cells to grow and multiply. This helps to slow or stop the spread of cancer cells. Hyaluronidase  works by increasing the absorption of other medications in the body to help them work better. This medication may also be used treat amyloidosis, a condition that causes the buildup of a protein (amyloid) in your body. It works  by reducing the buildup of this protein, which decreases symptoms. It is a combination medication that contains a monoclonal antibody. This medicine may be used for other purposes; ask your health care provider or pharmacist if you have questions. COMMON BRAND NAME(S): DARZALEX  FASPRO What should I tell my care team before I take  this medication? They need to know if you have any of these conditions: Heart disease Infection, such as chickenpox, cold sores, herpes, hepatitis B Lung or breathing disease An unusual or allergic reaction to daratumumab , hyaluronidase , other medications, foods, dyes, or preservatives Pregnant or trying to get pregnant Breast-feeding How should I use this medication? This medication is injected under the skin. It is given by your care team in a hospital or clinic setting. Talk to your care team about the use of this medication in children. Special care may be needed. Overdosage: If you think you have taken too much of this medicine contact a poison control [facility] or emergency room at once. NOTE: This medicine is only for you. Do not share this medicine with others. What if I miss a dose? Keep appointments for follow-up doses. It is important not to miss your dose. Call your care team if you are unable to keep an appointment. What may interact with this medication? Interactions have not been studied. This list may not describe all possible interactions. Give your health care provider a list of all the medicines, herbs, non-prescription drugs, or dietary supplements you use. Also tell them if you smoke, drink alcohol, or use illegal drugs. Some items may interact with your medicine. What should I watch for while using this medication? Your condition will be monitored carefully while you are receiving this medication. This medication can cause serious allergic reactions. To reduce your risk, your care team may give you other medication to take before receiving this one. Be sure to follow the directions from your care team. This medication can affect the results of blood tests to match your blood type. These changes can last for up to 6 months after the final dose. Your care team will do blood tests to match your blood type before you start treatment. Tell all of your care team that you are being  treated with this medication before receiving a blood transfusion. This medication can affect the results of some tests used to determine treatment response; extra tests may be needed to evaluate response. Talk to your care team if you wish to become pregnant or think you are pregnant. This medication can cause serious birth defects if taken during pregnancy and for 3 months after the last dose. A reliable form of contraception is recommended while taking this medication and for 3 months after the last dose. Talk to your care team about effective forms of contraception. Do not breast-feed while taking this medication. What side effects may I notice from receiving this medication? Side effects that you should report to your care team as soon as possible: Allergic reactions--skin rash, itching, hives, swelling of the face, lips, tongue, or throat Heart rhythm changes--fast or irregular heartbeat, dizziness, feeling faint or lightheaded, chest pain, trouble breathing Infection--fever, chills, cough, sore throat, wounds that don't heal, pain or trouble when passing urine, general feeling of discomfort or being unwell Infusion reactions--chest pain, shortness of breath or trouble breathing, feeling faint or lightheaded Sudden eye pain or change in vision such as blurry vision, seeing halos around lights, vision loss Unusual bruising or bleeding Side effects that usually do not require medical  attention (report to your care team if they continue or are bothersome): Constipation Diarrhea Fatigue Nausea Pain, tingling, or numbness in the hands or feet Swelling of the ankles, hands, or feet This list may not describe all possible side effects. Call your doctor for medical advice about side effects. You may report side effects to FDA at 1-800-FDA-1088. Where should I keep my medication? This medication is given in a hospital or clinic. It will not be stored at home. NOTE: This sheet is a summary. It may  not cover all possible information. If you have questions about this medicine, talk to your doctor, pharmacist, or health care provider.  2024 Elsevier/Gold Standard (2021-12-09 00:00:00) Zoledronic  Acid Injection (Cancer) What is this medication? ZOLEDRONIC  ACID (ZOE le dron ik AS id) treats high calcium levels in the blood caused by cancer. It may also be used with chemotherapy to treat weakened bones caused by cancer. It works by slowing down the release of calcium from bones. This lowers calcium levels in your blood. It also makes your bones stronger and less likely to break (fracture). It belongs to a group of medications called bisphosphonates. This medicine may be used for other purposes; ask your health care provider or pharmacist if you have questions. COMMON BRAND NAME(S): Zometa , Zometa  Powder What should I tell my care team before I take this medication? They need to know if you have any of these conditions: Dehydration Dental disease Kidney disease Liver disease Low levels of calcium in the blood Lung or breathing disease, such as asthma Receiving steroids, such as dexamethasone  or prednisone  An unusual or allergic reaction to zoledronic  acid, other medications, foods, dyes, or preservatives Pregnant or trying to get pregnant Breast-feeding How should I use this medication? This medication is injected into a vein. It is given by your care team in a hospital or clinic setting. Talk to your care team about the use of this medication in children. Special care may be needed. Overdosage: If you think you have taken too much of this medicine contact a poison control [facility] or emergency room at once. NOTE: This medicine is only for you. Do not share this medicine with others. What if I miss a dose? Keep appointments for follow-up doses. It is important not to miss your dose. Call your care team if you are unable to keep an appointment. What may interact with this medication? Certain  antibiotics given by injection Diuretics, such as bumetanide, furosemide NSAIDs, medications for pain and inflammation, such as ibuprofen or naproxen Teriparatide Thalidomide This list may not describe all possible interactions. Give your health care provider a list of all the medicines, herbs, non-prescription drugs, or dietary supplements you use. Also tell them if you smoke, drink alcohol, or use illegal drugs. Some items may interact with your medicine. What should I watch for while using this medication? Visit your care team for regular checks on your progress. It may be some time before you see the benefit from this medication. Some people who take this medication have severe bone, joint, or muscle pain. This medication may also increase your risk for jaw problems or a broken thigh bone. Tell your care team right away if you have severe pain in your jaw, bones, joints, or muscles. Tell you care team if you have any pain that does not go away or that gets worse. Tell your dentist and dental surgeon that you are taking this medication. You should not have major dental surgery while on this medication. See  your dentist to have a dental exam and fix any dental problems before starting this medication. Take good care of your teeth while on this medication. Make sure you see your dentist for regular follow-up appointments. You should make sure you get enough calcium and vitamin D while you are taking this medication. Discuss the foods you eat and the vitamins you take with your care team. Check with your care team if you have severe diarrhea, nausea, and vomiting, or if you sweat a lot. The loss of too much body fluid may make it dangerous for you to take this medication. You may need bloodwork while taking this medication. Talk to your care team if you wish to become pregnant or think you might be pregnant. This medication can cause serious birth defects. What side effects may I notice from receiving  this medication? Side effects that you should report to your care team as soon as possible: Allergic reactions--skin rash, itching, hives, swelling of the face, lips, tongue, or throat Kidney injury--decrease in the amount of urine, swelling of the ankles, hands, or feet Low calcium level--muscle pain or cramps, confusion, tingling, or numbness in the hands or feet Osteonecrosis of the jaw--pain, swelling, or redness in the mouth, numbness of the jaw, poor healing after dental work, unusual discharge from the mouth, visible bones in the mouth Severe bone, joint, or muscle pain Side effects that usually do not require medical attention (report to your care team if they continue or are bothersome): Constipation Fatigue Fever Loss of appetite Nausea Stomach pain This list may not describe all possible side effects. Call your doctor for medical advice about side effects. You may report side effects to FDA at 1-800-FDA-1088. Where should I keep my medication? This medication is given in a hospital or clinic. It will not be stored at home. NOTE: This sheet is a summary. It may not cover all possible information. If you have questions about this medicine, talk to your doctor, pharmacist, or health care provider.  2024 Elsevier/Gold Standard (2021-09-26 00:00:00)

## 2023-09-24 NOTE — Progress Notes (Signed)
 Patient seen by Dr. Arley Hof today  Vitals are within treatment parameters:Yes   Labs are within treatment parameters: Yes   Treatment plan has been signed: Yes   Per physician team, Patient is ready for treatment and there are NO modifications to the treatment plan.

## 2023-10-15 ENCOUNTER — Other Ambulatory Visit: Payer: Self-pay | Admitting: Oncology

## 2023-10-22 ENCOUNTER — Inpatient Hospital Stay: Payer: Medicare Other

## 2023-10-22 ENCOUNTER — Encounter (HOSPITAL_BASED_OUTPATIENT_CLINIC_OR_DEPARTMENT_OTHER)

## 2023-10-22 ENCOUNTER — Inpatient Hospital Stay: Payer: Medicare Other | Attending: Nurse Practitioner | Admitting: Nurse Practitioner

## 2023-10-22 ENCOUNTER — Other Ambulatory Visit: Payer: Self-pay | Admitting: *Deleted

## 2023-10-22 ENCOUNTER — Encounter: Payer: Self-pay | Admitting: Nurse Practitioner

## 2023-10-22 VITALS — BP 148/75 | HR 70 | Temp 98.1°F | Resp 18 | Ht 70.0 in | Wt 270.6 lb

## 2023-10-22 DIAGNOSIS — G4730 Sleep apnea, unspecified: Secondary | ICD-10-CM | POA: Diagnosis not present

## 2023-10-22 DIAGNOSIS — M25512 Pain in left shoulder: Secondary | ICD-10-CM | POA: Insufficient documentation

## 2023-10-22 DIAGNOSIS — N202 Calculus of kidney with calculus of ureter: Secondary | ICD-10-CM | POA: Diagnosis not present

## 2023-10-22 DIAGNOSIS — N390 Urinary tract infection, site not specified: Secondary | ICD-10-CM | POA: Insufficient documentation

## 2023-10-22 DIAGNOSIS — E1140 Type 2 diabetes mellitus with diabetic neuropathy, unspecified: Secondary | ICD-10-CM | POA: Diagnosis not present

## 2023-10-22 DIAGNOSIS — C9000 Multiple myeloma not having achieved remission: Secondary | ICD-10-CM | POA: Insufficient documentation

## 2023-10-22 DIAGNOSIS — Z87440 Personal history of urinary (tract) infections: Secondary | ICD-10-CM | POA: Insufficient documentation

## 2023-10-22 DIAGNOSIS — J45909 Unspecified asthma, uncomplicated: Secondary | ICD-10-CM | POA: Diagnosis not present

## 2023-10-22 DIAGNOSIS — I10 Essential (primary) hypertension: Secondary | ICD-10-CM | POA: Insufficient documentation

## 2023-10-22 DIAGNOSIS — N200 Calculus of kidney: Secondary | ICD-10-CM | POA: Insufficient documentation

## 2023-10-22 DIAGNOSIS — M7989 Other specified soft tissue disorders: Secondary | ICD-10-CM | POA: Diagnosis not present

## 2023-10-22 LAB — CBC WITH DIFFERENTIAL (CANCER CENTER ONLY)
Abs Immature Granulocytes: 0.01 10*3/uL (ref 0.00–0.07)
Basophils Absolute: 0.1 10*3/uL (ref 0.0–0.1)
Basophils Relative: 2 %
Eosinophils Absolute: 0.5 10*3/uL (ref 0.0–0.5)
Eosinophils Relative: 7 %
HCT: 44.9 % (ref 39.0–52.0)
Hemoglobin: 15 g/dL (ref 13.0–17.0)
Immature Granulocytes: 0 %
Lymphocytes Relative: 30 %
Lymphs Abs: 2.1 10*3/uL (ref 0.7–4.0)
MCH: 30.3 pg (ref 26.0–34.0)
MCHC: 33.4 g/dL (ref 30.0–36.0)
MCV: 90.7 fL (ref 80.0–100.0)
Monocytes Absolute: 0.9 10*3/uL (ref 0.1–1.0)
Monocytes Relative: 12 %
Neutro Abs: 3.5 10*3/uL (ref 1.7–7.7)
Neutrophils Relative %: 49 %
Platelet Count: 161 10*3/uL (ref 150–400)
RBC: 4.95 MIL/uL (ref 4.22–5.81)
RDW: 15.2 % (ref 11.5–15.5)
WBC Count: 7.1 10*3/uL (ref 4.0–10.5)
nRBC: 0 % (ref 0.0–0.2)

## 2023-10-22 LAB — CMP (CANCER CENTER ONLY)
ALT: 17 U/L (ref 0–44)
AST: 20 U/L (ref 15–41)
Albumin: 4.4 g/dL (ref 3.5–5.0)
Alkaline Phosphatase: 46 U/L (ref 38–126)
Anion gap: 6 (ref 5–15)
BUN: 20 mg/dL (ref 8–23)
CO2: 28 mmol/L (ref 22–32)
Calcium: 9.7 mg/dL (ref 8.9–10.3)
Chloride: 106 mmol/L (ref 98–111)
Creatinine: 0.92 mg/dL (ref 0.61–1.24)
GFR, Estimated: >60 mL/min (ref >60)
Glucose, Bld: 178 mg/dL — ABNORMAL HIGH (ref 70–99)
Potassium: 4.5 mmol/L (ref 3.5–5.1)
Sodium: 140 mmol/L (ref 135–145)
Total Bilirubin: 0.5 mg/dL (ref 0.0–1.2)
Total Protein: 6.6 g/dL (ref 6.5–8.1)

## 2023-10-22 NOTE — Progress Notes (Signed)
 [facility] OFFICE PROGRESS NOTE   Diagnosis: Multiple myeloma  INTERVAL HISTORY:   Jeffery Higgins returns as scheduled.  He completed a cycle of daratumumab 09/24/2023.  He completed another cycle of Revlimid beginning 09/25/2023.  He reports the neuropathy symptoms in his hands and feet have worsened.  He feels symptoms are affecting his balance.  He denies nausea/vomiting.  No mouth sores.  No diarrhea.  No rash.  No shortness of breath or wheezing after daratumumab.  He continues to note leg swelling.  No cough or shortness of breath.  Objective:  Vital signs in last 24 hours:  Blood pressure (!) 148/75, pulse 70, temperature 98.1 F (36.7 C), temperature source Temporal, resp. rate 18, height 5\' 10"  (1.778 m), weight 270 lb 9.6 oz (122.7 kg), SpO2 97%.    HEENT: No thrush or ulcers. Resp: Lungs clear bilaterally. Cardio: Regular rate and rhythm. GI: Abdomen soft and nontender.  No hepatosplenomegaly. Vascular: Trace lower leg edema bilaterally.  Calves nontender. Skin: No rash.   Lab Results:  Lab Results  Component Value Date   WBC 7.1 10/22/2023   HGB 15.0 10/22/2023   HCT 44.9 10/22/2023   MCV 90.7 10/22/2023   PLT 161 10/22/2023   NEUTROABS 3.5 10/22/2023    Imaging:  No results found.  Medications: I have reviewed the patient's current medications.  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 numerous enhancing lesions of the skull base and calvarium no evidence of extraosseous component 12/17/2022-MRI of the cervical, thoracic, and lumbar spine-diffuse  osseous metastatic disease throughout the spine, clivus, bilateral occipital condyles, ribs, sacrum, and iliac bone, no evidence of pathologic fracture, expansile lesion in C3 extends into the spinal canal by 4 mm with moderate to severe spinal canal stenosis posterior to C3, mild right foraminal narrowing at T2-T3 and T3-4 related to osseous tumor at T3 12/17/2022-SPEP-2.6 g serum M spike, IgG kappa Bone marrow biopsy 12/18/2022-multiple myeloma with plasma cells involving 60% of the cellular marrow, kappa restricted myeloma FISH panel-QNS Bone survey 12/18/2022-lucencies at the skull, right scapula, left clavicle, lumbar spine, pelvis, and left femoral neck.  Destructive lesion at C3, possible sclerotic lucent lesions in the thoracic spine 12/18/2022 pelvic radiation to C3 and humeral head-12/31/2022 Cycle 1 daratumumab-VRD 01/01/2023 (Velcade 3 weeks on/1 week off) Cycle 2 daratumumab-VRD 01/29/2023, Revlimid discontinued after 19 days due to a pruritic rash Cycle 3 daratumumab-VRD 02/26/2023, Revlimid dose reduced to 10 mg Cycle 4 daratumumab-VRD 03/26/2023, Revlimid 10 mg x 18 days, Dex changed to prednisone Monday Wednesday Friday due to rash; Revlimid placed on hold 04/08/2023 due to rash at the lower abdominal wall and pruritus Cycle 5 daratumumab/VRD 04/23/2023-patient declines to resume Revlimid due to rash/pruritus; Velcade placed on hold 04/23/2023 due to progressive neuropathy; proceeded with daratumumab and dexamethasone 04/28/2023 appointment with Dr. Melrose Nakayama [facility]-recommend continuing daratumumab and dexamethasone.  Would attempt to resume lenalidomide 5 mg days 1 through 21 with addition of daily famotidine and as needed topical steroids; escalate back to 10 mg if tolerated; dexamethasone 20 mg p.o. weekly.  Cycle length 28 days. Cycle 6 Revlimid/dexamethasone plus daratumumab 05/07/2023 (Revlimid 5 mg days 1 through 21, 28-day cycle; dexamethasone 10 mg weekly (unable to tolerate higher  dose of dexamethasone  due to emotional lability); Pepcid 20 mg daily). Zometa monthly during induction therapy then every 3 months x 2 years Cycle 7 Revlimid/Decadron plus daratumumab 06/04/2023 (Revlimid 5 mg days 1-21, 28-day cycle, Decadron 4 mg weekly (Decadron further reduced secondary to emotional lability) Cycle 8 Revlimid/Decadron plus daratumumab 07/02/2023 Cycle 9 Revlimid/Decadron plus daratumumab 07/30/2023 (he will discontinue home steroids) Myeloma panel at [facility] 07/28/2023, IgG kappa protein, M spike too low to quantify Cycle 10 Revlimid/Decadron plus daratumumab 08/27/2023 Cycle 11 Revlimid/daratumumab-09/24/2023, Revlimid starting 09/25/2023 Bone marrow biopsy at [facility] 09/29/2023-normocellular bone marrow with trilineage hematopoiesis and 1% blasts by manual aspirate differential.  No morphologic or overt immunophenotypic evidence of plasma cell neoplasm.  No monotypic plasma cells identified (MRD negative). Treatment held 10/22/2023 per patient request Appointment with Dr. Melrose Nakayama 11/03/2023   2.  Left shoulder pain-likely secondary to a lytic lesion at the left humeral head/neck 3.  Left chin/distal mandible numbness-likely secondary to myeloma compressing nerve roots at the brainstem or left face 4.  Hypertension 5.  Diabetes 6.  Sleep apnea 7.  Nephrolithiasis-right ureteral calculus urinary tract infection, placement of a right ureter stent right stone dislodgment 10/01/2022 8.  Asthma 9.  Report of rash 04/08/2023-location and appearance 04/23/2023 consistent with skin change related to the Velcade injection.      Disposition: Jeffery Higgins appears stable.  He is on active treatment with Revlimid/daratumumab.  He had a bone marrow biopsy at [facility] last month with no evidence of plasma cell neoplasm, MRD negative.  He requested to place current treatment on hold until he sees Dr. Melrose Nakayama.  He is considering a treatment break.  He reports increased neuropathy symptoms in the hands and feet.  He feels symptoms  are affecting his balance.  Revlimid is being placed on hold as above.  He has trace edema in both legs.  We are referring him for venous Doppler studies.  He has an appointment with Dr. Melrose Nakayama at [facility] on 11/03/2023.  He will return for follow-up here on 11/05/2023 to review treatment recommendations.    Lonna Cobb ANP/GNP-BC   10/22/2023  11:32 AM

## 2023-10-23 LAB — IGG: IgG (Immunoglobin G), Serum: 901 mg/dL (ref 603–1613)

## 2023-10-25 LAB — KAPPA/LAMBDA LIGHT CHAINS
Kappa free light chain: 17.4 mg/L (ref 3.3–19.4)
Kappa, lambda light chain ratio: 1.36 (ref 0.26–1.65)
Lambda free light chains: 12.8 mg/L (ref 5.7–26.3)

## 2023-10-26 LAB — PROTEIN ELECTROPHORESIS, SERUM
A/G Ratio: 1.7 (ref 0.7–1.7)
Albumin ELP: 4 g/dL (ref 2.9–4.4)
Alpha-1-Globulin: 0.2 g/dL (ref 0.0–0.4)
Alpha-2-Globulin: 0.6 g/dL (ref 0.4–1.0)
Beta Globulin: 0.8 g/dL (ref 0.7–1.3)
Gamma Globulin: 0.7 g/dL (ref 0.4–1.8)
Globulin, Total: 2.3 g/dL (ref 2.2–3.9)
Total Protein ELP: 6.3 g/dL (ref 6.0–8.5)

## 2023-10-27 ENCOUNTER — Ambulatory Visit: Payer: Medicare Other | Admitting: Physician Assistant

## 2023-10-27 ENCOUNTER — Encounter: Payer: Self-pay | Admitting: Physician Assistant

## 2023-10-27 VITALS — BP 138/74 | HR 80 | Resp 20 | Ht 70.0 in | Wt 269.0 lb

## 2023-10-27 DIAGNOSIS — R413 Other amnesia: Secondary | ICD-10-CM

## 2023-10-27 NOTE — Progress Notes (Signed)
 Assessment/Plan:   Memory Impairment of unclear etiology, likely multifactorial  Jeffery Higgins is a very pleasant 68 y.o. RH male with a history of hypertension, diabetes, OSA, asthma, CoVID  2 months ago, chronic pain due to likely lesions history of IgGK Multiple Myeloma followed at the [facility], s/p  Revlimid/ daratumumab monthly with periodic Zometa  presenting today in follow-up for evaluation of memory loss. Patient is not on antidementia medication at this time in view of  recent chemotherapy.  Memory is stable, MMSE today at 28/30.  He is able to participate on his ADLs, continues to drive without difficulty.  Mood is stable.  Patient is to undergo neuropsych evaluation for diagnostic clarity at the end of this year since he is no longer on active chemotherapy.   Recommendations:   Follow up in 6  months. Patient is scheduled for neuropsych evaluation for diagnostic clarity, scheduled for November 2025. Recommend good control of cardiovascular risk factors Recommend revisiting OSA and the need for CPAP as this can improve REM disorder. Recommend psychotherapy for GAD and situational depression. Continue to control mood as per PCP    Subjective:   This patient is here alone. Previous records as well as any outside records available were reviewed prior to todays visit.   Patient was last seen on 07/26/2023 with [facility] 23/30    Any changes in memory since last visit? " About the same".  He reports some difficulty remembering new information, conversations and names, movie plots.  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  LTM is good.  repeats oneself?  Endorsed by his family Disoriented when walking into a room?  Patient denies    Misplacing objects? Misplaces keys but not in unusual places.   Wandering behavior?   Denies.   Any personality changes since last visit?  When taking steroids for the treatment of MM, he was "moody ", but he is doing  well now. Any worsening depression?:  Denies.  He has a history of GAD, does not have a psychotherapist Hallucinations or paranoia?  denies   Seizures?   denies    Any sleep changes? Sleeps well . Occasional dreams, he does report REM behavior (OSA), denies sleepwalking   Sleep apnea?  Endorsed, does not tolerate his CPAP, however he has not seen the pulmonologist lately to discuss other options. Any hygiene concerns?   denies   Independent of bathing and dressing?  Endorsed  Does the patient needs help with medications? Patient is in charge. He takes them all in the morning because he may forget the evening ones   Who is in charge of the finances?  Patient is in charge      Any changes in appetite? Increased appetite.   Patient have trouble swallowing?  Denies.   Does the patient cook?  Yes, denies forgetting, recipes.  Any kitchen accidents such as leaving the stove on?   denies   Any headaches?   Denies  Vision changes? denies Chronic pain?  He has chronic pain due to MM  Ambulates with difficulty?  Because of lower back pain has limited mobility, he goes to the gym 3 times a week Recent falls or head injuries?  Larey Seat of the bed 3 weeks ago and hit R parietal area, no LOC.  Did not  Unilateral weakness, numbness or tingling?  He has bilateral lower extremity chemo induced neuropathy  any tremors?  Denies.   Any anosmia?    Denies.  Any incontinence of urine?  He has a history of urge incontinence.` Any bowel dysfunction?  He has a history of constipation, takes MiraLAX Patient lives with his wife Does the patient drive?  Yes, denies getting lost  MRI of the brain, personally reviewed remarkable for numerous enhancing lesions at the skull base and calvarium consistent with multiple myeloma, without evidence of extraosseous component, and branch of the right superior cerebellar artery, compressing terminal segment of the right trigeminal nerve (resolved since), mild atrophy, and mild  chronic microvascular changes without acute findings   Initial visit 07/26/2023 How long did patient have memory difficulties? For the last year, after starting his treatments.  Reports some difficulty remembering new information, conversations and names. He forgets movie plots.  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 repeats oneself?  Endorsed by his family Disoriented when walking into a room?  Patient denies except occasionally not remembering what patient came to the room for    Leaving objects in unusual places? Denies.  He may misplace his wallet   Wandering behavior?  Denies.  Any personality changes?  "Been more moody since taking steroids" Any history of depression?:  Yes,  anxiety too, does not have a psychotherapist  Hallucinations or paranoia?  Denies   Seizures?  Denies    Any sleep changes?   Sleeps well except having to wake up to urinate, denies vivid dreams, REM behavior but not frequently, denies  sleepwalking   Sleep apnea? Endorsed, could not tolerate CPAP Any hygiene concerns?  Denies   Independent of bathing and dressing?  Endorsed  Does the patient needs help with medications? Patient is in charge   Who is in charge of the finances? Patient is in charge     Any changes in appetite? Has been gaining some weight  Patient have trouble swallowing? Denies.   Does the patient cook?  Yes , denies forgetting common recipes   Any kitchen accidents such as leaving the stove on? Denies.   Any history of headaches? Endorsed, less frequent since retiring from [employer]  Chronic pain ?  He has chronic left shoulder pain likely due to lytic lesion in the left humeral head.  Until recently, he had a C3 lesion which has recovered bone tissue, this is followed by Dr. Lovell Sheehan (neurosurgery) ambulates with difficulty?  Denies.  Recent falls or head injuries? Denies.   Vision changes? Denies.   Unilateral  weakness, numbness or tingling?  He had a history  of  compression  of the nerve root at the brainstem causing left chin -distal mandible numbness, resolving 3 months ago. He has B LE neuropathy (chemo induced) Any tremors?   Denies.   Any anosmia?  Denies.   Any incontinence of urine?  Urge incontinence. Doe not wear pads  Any bowel dysfunction? Takes Miralax for chronic constipation    Patient lives with wife  History of heavy alcohol intake? Denies.   History of heavy tobacco use? Denies.   Family history of dementia? Mother had dementia ?type Does patient drive? Yes, denies getting lost, tries to drive on familiar areas      Past Medical History:  Diagnosis Date   Arthritis    Asthma    BMI 40.0-44.9, adult (HCC) 08/03/2012   Diabetes mellitus type 2 in obese 08/03/2012   Diabetes mellitus without complication (HCC)    History of kidney stones 08/18/2003   Hypertension  Hypertension 08/03/2012   Kidney stone    Sleep apnea    not using CPAP - could not tolerate     Past Surgical History:  Procedure Laterality Date   CHOLECYSTECTOMY  08/02/2012   Procedure: LAPAROSCOPIC CHOLECYSTECTOMY WITH INTRAOPERATIVE CHOLANGIOGRAM;  Surgeon: [name], MD;  Location: [facility] OR;  Service: General;  Laterality: N/A;  laparoscopic cholecystectomy with intraoperative cholangiogram   KNEE ARTHROSCOPY     LITHOTRIPSY     SHOULDER ARTHROSCOPY WITH SUBACROMIAL DECOMPRESSION  07/07/2012   Procedure: SHOULDER ARTHROSCOPY WITH SUBACROMIAL DECOMPRESSION;  Surgeon: [name], MD;  Location: [facility] OR;  Service: Orthopedics;  Laterality: Right;  with distal clavicle resection     PREVIOUS MEDICATIONS:   CURRENT MEDICATIONS:  Outpatient Encounter Medications as of 10/27/2023  Medication Sig   acetaminophen (TYLENOL) 325 MG tablet Take 2 tablets (650 mg total) by mouth every 6 (six) hours as needed (or Temp > 100).   acyclovir (ZOVIRAX) 400 MG tablet TAKE 1 TABLET(400 MG) BY MOUTH TWICE  DAILY   albuterol (PROVENTIL) (2.5 MG/3ML) 0.083% nebulizer solution Inhale 2.5 mg into the lungs every 6 (six) hours as needed for shortness of breath.   albuterol (VENTOLIN HFA) 108 (90 Base) MCG/ACT inhaler Inhale 1 puff into the lungs every 6 (six) hours as needed for shortness of breath. (Patient not taking: Reported on 09/24/2023)   ALPRAZolam (XANAX) 0.5 MG tablet Take 1 tablet (0.5 mg total) by mouth 2 (two) times daily as needed for anxiety. Do not drive while taking (Patient not taking: Reported on 10/22/2023)   amLODipine (NORVASC) 5 MG tablet Take 5 mg by mouth daily.   aspirin EC 81 MG tablet Take 81 mg by mouth daily. Swallow whole.   cyclobenzaprine (FLEXERIL) 10 MG tablet Take 10 mg by mouth 3 (three) times daily as needed for muscle spasms. (Patient not taking: Reported on 07/26/2023)   diphenhydrAMINE (BENADRYL) 25 mg capsule Take 25-50 mg by mouth every 6 (six) hours as needed for itching. (Patient not taking: Reported on 07/02/2023)   famotidine (PEPCID) 20 MG tablet Take 1 tablet (20 mg total) by mouth daily. (Patient not taking: Reported on 05/07/2023)   glipiZIDE (GLUCOTROL XL) 10 MG 24 hr tablet Take 10 mg by mouth daily.   hydrocortisone 2.5 % cream Apply topically 2 (two) times daily.   hydrOXYzine (ATARAX) 10 MG tablet TAKE 1 TABLET(10 MG) BY MOUTH THREE TIMES DAILY AS NEEDED (Patient not taking: Reported on 10/22/2023)   JANUVIA 100 MG tablet Take 100 mg by mouth daily.   lenalidomide (REVLIMID) 5 MG capsule Take 1 capsule (5 mg total) by mouth daily. Take daily x 21 days on and 7 day rest. Repeat every 28 days. Start cycle on 10/23/2023   losartan (COZAAR) 100 MG tablet Take 100 mg by mouth daily.   oxyCODONE (ROXICODONE) 5 MG immediate release tablet Take 1 tablet (5 mg total) by mouth every 4 (four) hours as needed for severe pain. (Patient not taking: Reported on 10/22/2023)   pantoprazole (PROTONIX) 20 MG tablet Take 1 tablet (20 mg total) by mouth daily. (Patient not taking:  Reported on 05/07/2023)   Plecanatide (TRULANCE) 3 MG TABS Take 3 mg by mouth daily as needed. (Patient not taking: Reported on 10/22/2023)   polyethylene glycol (MIRALAX / GLYCOLAX) 17 g packet Take 17 g by mouth daily.   prochlorperazine (COMPAZINE) 10 MG tablet Take 1 tablet (10 mg total) by mouth every 6 (six) hours as needed. (Patient not taking: Reported on  02/26/2023)   senna (SENOKOT) 8.6 MG TABS tablet Take 1 tablet (8.6 mg total) by mouth 2 (two) times daily. (Patient not taking: Reported on 07/02/2023)   No facility-administered encounter medications on file as of 10/27/2023.     Objective:     PHYSICAL EXAMINATION:    VITALS:   Vitals:   10/27/23 1520  BP: 138/74  Pulse: 80  Resp: 20  SpO2: 97%  Weight: 269 lb (122 kg)  Height: 5\' 10"  (1.778 m)    GEN:  The patient appears stated age and is in NAD. HEENT:  Normocephalic, atraumatic.   Neurological examination:  General: NAD, well-groomed, appears stated age. Orientation: The patient is alert. Oriented to person, place and not to date Cranial nerves: There is good facial symmetry.The speech is fluent and clear. No aphasia or dysarthria. Fund of knowledge is appropriate. Recent memory impaired and remote memory is normal.  Attention and concentration are normal.  Able to name objects and repeat phrases.  Hearing is intact to conversational tone.   Delayed recall 3/3 Sensation: Sensation is intact to light touch throughout Motor: Strength is at least antigravity x4. DTR's 2/4 in UE/LE      07/26/2023    4:00 PM  Montreal Cognitive Assessment   Visuospatial/ Executive (0/5) 4  Naming (0/3) 3  Attention: Read list of digits (0/2) 2  Attention: Read list of letters (0/1) 1  Attention: Serial 7 subtraction starting at 100 (0/3) 3  Language: Repeat phrase (0/2) 1  Language : Fluency (0/1) 1  Abstraction (0/2) 0  Delayed Recall (0/5) 2  Orientation (0/6) 6  Total 23  Adjusted Score (based on education) 23         No data to display             Movement examination: Tone: There is normal tone in the UE/LE Abnormal movements:  no tremor.  No myoclonus.  No asterixis.   Coordination:  There is no decremation with RAM's. Normal finger to nose  Gait and Station: The patient has no difficulty arising out of a deep-seated chair without the use of the hands. The patient's stride length is good.  Gait is cautious and narrow.   Thank you for allowing [place] the opportunity to participate in the care of this nice patient. Please do not hesitate to contact us for any questions or concerns.   Total time spent on today's visit was 34 minutes dedicated to this patient today, preparing to see patient, examining the patient, ordering tests and/or medications and counseling the patient, documenting clinical information in the EHR or other health record, independently interpreting results and communicating results to the patient/family, discussing treatment and goals, answering patient's questions and coordinating care.  Cc:  Pcp, No  Marlowe Kays 10/27/2023 3:50 PM

## 2023-10-27 NOTE — Patient Instructions (Signed)
 It was a pleasure to see you today at our office.   Recommendations:  Neurocognitive evaluation at our office   Follow up in 6 months    For psychiatric meds, mood meds: Please have your primary care physician manage these medications.  If you have any severe symptoms of a stroke, or other severe issues such as confusion,severe chills or fever, etc call 911 or go to the ER as you may need to be evaluated further   For guidance regarding [facility] and if placement were needed at the facility, contact Social Worker tel: [phone]  For assessment of decision of mental capacity and competency:  Call Dr. Erick Blinks, geriatric psychiatrist at [phone]  Counseling regarding caregiver distress, including caregiver depression, anxiety and issues regarding community resources, adult day care programs, adult living facilities, or memory care questions:  please contact your  Primary Doctor's Social Worker   Whom to call: Memory  decline, memory medications: Call our office [phone]    [email or web address]   RECOMMENDATIONS FOR ALL PATIENTS WITH MEMORY PROBLEMS: 1. Continue to exercise (Recommend 30 minutes of walking everyday, or 3 hours every week) 2. Increase social interactions - continue going to [place] and enjoy social gatherings with friends and family 3. Eat healthy, avoid fried foods and eat more fruits and vegetables 4. Maintain adequate blood pressure, blood sugar, and blood cholesterol level. Reducing the risk of stroke and cardiovascular disease also helps promoting better memory. 5. Avoid stressful situations. Live a simple life and avoid aggravations. Organize your time and prepare for the next day in anticipation. 6. Sleep well, avoid any interruptions of sleep and avoid any distractions in the bedroom that may interfere with adequate sleep quality 7. Avoid sugar, avoid sweets as there is a  strong link between excessive sugar intake, diabetes, and cognitive impairment We discussed the Mediterranean diet, which has been shown to help patients reduce the risk of progressive memory disorders and reduces cardiovascular risk. This includes eating fish, eat fruits and green leafy vegetables, nuts like almonds and hazelnuts, walnuts, and also use olive oil. Avoid fast foods and fried foods as much as possible. Avoid sweets and sugar as sugar use has been linked to worsening of memory function.  There is always a concern of gradual progression of memory problems. If this is the case, then we may need to adjust level of care according to patient needs. Support, both to the patient and caregiver, should then be put into place.      You have been referred for a neuropsychological evaluation (i.e., evaluation of memory and thinking abilities). Please bring someone with you to this appointment if possible, as it is helpful for the doctor to hear from both you and another adult who knows you well. Please bring eyeglasses and hearing aids if you wear them.    The evaluation will take approximately 3 hours and has two parts:   The first part is a clinical interview with the neuropsychologist (Dr. Milbert Coulter or Dr. Roseanne Reno). During the interview, the neuropsychologist will speak with you and the individual you brought to the appointment.    The second part of the evaluation is testing with the doctor's technician Annabelle Harman or Selena Batten). During the testing, the technician will ask you to remember different types of material, solve problems, and answer some questionnaires. Your family member will not be present for this portion of the evaluation.   Please note: We must reserve several hours of the neuropsychologist's time and the  psychometrician's time for your evaluation appointment. As such, there is a No-Show fee of $100. If you are unable to attend any of your appointments, please contact our office as soon as  possible to reschedule.      DRIVING: Regarding driving, in patients with progressive memory problems, driving will be impaired. We advise to have someone else do the driving if trouble finding directions or if minor accidents are reported. Independent driving assessment is available to determine safety of driving.   If you are interested in the driving assessment, you can contact the following:  [facility] [employer] in [place] [phone]  [facility] [phone]  [facility] [phone]  [facility] [phone] or [phone]   FALL PRECAUTIONS: Be cautious when walking. Scan the area for obstacles that may increase the risk of trips and falls. When getting up in the mornings, sit up at the edge of the bed for a few minutes before getting out of bed. Consider elevating the bed at the head end to avoid drop of blood pressure when getting up. Walk always in a well-lit room (use night lights in the walls). Avoid area rugs or power cords from appliances in the middle of the walkways. Use a walker or a cane if necessary and consider physical therapy for balance exercise. Get your eyesight checked regularly.  FINANCIAL OVERSIGHT: Supervision, especially oversight when making financial decisions or transactions is also recommended.  HOME SAFETY: Consider the safety of the kitchen when operating appliances like stoves, microwave oven, and blender. Consider having supervision and share cooking responsibilities until no longer able to participate in those. Accidents with firearms and other hazards in the house should be identified and addressed as well.   ABILITY TO BE LEFT ALONE: If patient is unable to contact 911 operator, consider using LifeLine, or when the need is there, arrange for someone to stay with patients. Smoking is a fire hazard, consider supervision or cessation. Risk of wandering should be assessed by caregiver and if detected at any  point, supervision and safe proof recommendations should be instituted.  MEDICATION SUPERVISION: Inability to self-administer medication needs to be constantly addressed. Implement a mechanism to ensure safe administration of the medications.      Mediterranean Diet A Mediterranean diet refers to food and lifestyle choices that are based on the traditions of countries located on the [employer] [place]. This way of eating has been shown to help prevent certain conditions and improve outcomes for people who have chronic diseases, like kidney disease and heart disease. What are tips for following this plan? Lifestyle  Cook and eat meals together with your family, when possible. Drink enough fluid to keep your urine clear or pale yellow. Be physically active every day. This includes: Aerobic exercise like running or swimming. Leisure activities like gardening, walking, or housework. Get 7-8 hours of sleep each night. If recommended by your health care provider, drink red wine in moderation. This means 1 glass a day for nonpregnant women and 2 glasses a day for men. A glass of wine equals 5 oz (150 mL). Reading food labels  Check the serving size of packaged foods. For foods such as rice and pasta, the serving size refers to the amount of cooked product, not dry. Check the total fat in packaged foods. Avoid foods that have saturated fat or trans fats. Check the ingredients list for added sugars, such as corn syrup. Shopping  At the grocery store, buy most of your food from the areas  near the walls of the store. This includes: Fresh fruits and vegetables (produce). Grains, beans, nuts, and seeds. Some of these may be available in unpackaged forms or large amounts (in bulk). Fresh seafood. Poultry and eggs. Low-fat dairy products. Buy whole ingredients instead of prepackaged foods. Buy fresh fruits and vegetables in-season from local farmers markets. Buy frozen fruits and vegetables in  resealable bags. If you do not have access to quality fresh seafood, buy precooked frozen shrimp or canned fish, such as tuna, salmon, or sardines. Buy small amounts of raw or cooked vegetables, salads, or olives from the deli or salad bar at your store. Stock your pantry so you always have certain foods on hand, such as olive oil, canned tuna, canned tomatoes, rice, pasta, and beans. Cooking  Cook foods with extra-virgin olive oil instead of using butter or other vegetable oils. Have meat as a side dish, and have vegetables or grains as your main dish. This means having meat in small portions or adding small amounts of meat to foods like pasta or stew. Use beans or vegetables instead of meat in common dishes like chili or lasagna. Experiment with different cooking methods. Try roasting or broiling vegetables instead of steaming or sauteing them. Add frozen vegetables to soups, stews, pasta, or rice. Add nuts or seeds for added healthy fat at each meal. You can add these to yogurt, salads, or vegetable dishes. Marinate fish or vegetables using olive oil, lemon juice, garlic, and fresh herbs. Meal planning  Plan to eat 1 vegetarian meal one day each week. Try to work up to 2 vegetarian meals, if possible. Eat seafood 2 or more times a week. Have healthy snacks readily available, such as: Vegetable sticks with hummus. Greek yogurt. Fruit and nut trail mix. Eat balanced meals throughout the week. This includes: Fruit: 2-3 servings a day Vegetables: 4-5 servings a day Low-fat dairy: 2 servings a day Fish, poultry, or lean meat: 1 serving a day Beans and legumes: 2 or more servings a week Nuts and seeds: 1-2 servings a day Whole grains: 6-8 servings a day Extra-virgin olive oil: 3-4 servings a day Limit red meat and sweets to only a few servings a month What are my food choices? Mediterranean diet Recommended Grains: Whole-grain pasta. Brown rice. Bulgar wheat. Polenta. Couscous.  Whole-wheat bread. Orpah Cobb. Vegetables: Artichokes. Beets. Broccoli. Cabbage. Carrots. Eggplant. Green beans. Chard. Kale. Spinach. Onions. Leeks. Peas. Squash. Tomatoes. Peppers. Radishes. Fruits: Apples. Apricots. Avocado. Berries. Bananas. Cherries. Dates. Figs. Grapes. Lemons. Melon. Oranges. Peaches. Plums. Pomegranate. Meats and other protein foods: Beans. Almonds. Sunflower seeds. Pine nuts. Peanuts. Cod. Salmon. Scallops. Shrimp. Tuna. Tilapia. Clams. Oysters. Eggs. Dairy: Low-fat milk. Cheese. Greek yogurt. Beverages: Water. Red wine. Herbal tea. Fats and oils: Extra virgin olive oil. Avocado oil. Grape seed oil. Sweets and desserts: [place] yogurt with honey. Baked apples. Poached pears. Trail mix. Seasoning and other foods: Basil. Cilantro. Coriander. Cumin. Mint. Parsley. Sage. Rosemary. Tarragon. Garlic. Oregano. Thyme. Pepper. Balsalmic vinegar. Tahini. Hummus. Tomato sauce. Olives. Mushrooms. Limit these Grains: Prepackaged pasta or rice dishes. Prepackaged cereal with added sugar. Vegetables: Deep fried potatoes (french fries). Fruits: Fruit canned in syrup. Meats and other protein foods: Beef. Pork. Lamb. Poultry with skin. Hot dogs. Tomasa Blase. Dairy: Ice cream. Sour cream. Whole milk. Beverages: Juice. Sugar-sweetened soft drinks. Beer. Liquor and spirits. Fats and oils: Butter. Canola oil. Vegetable oil. Beef fat (tallow). Lard. Sweets and desserts: Cookies. Cakes. Pies. Candy. Seasoning and other foods: Mayonnaise. Premade sauces and marinades.  The items listed may not be a complete list. Talk with your dietitian about what dietary choices are right for you. Summary The Mediterranean diet includes both food and lifestyle choices. Eat a variety of fresh fruits and vegetables, beans, nuts, seeds, and whole grains. Limit the amount of red meat and sweets that you eat. Talk with your health care provider about whether it is safe for you to drink red wine in moderation. This  means 1 glass a day for nonpregnant women and 2 glasses a day for men. A glass of wine equals 5 oz (150 mL). This information is not intended to replace advice given to you by your health care provider. Make sure you discuss any questions you have with your health care provider. Document Released: 03/26/2016 Document Revised: 04/28/2016 Document Reviewed: 03/26/2016 Elsevier Interactive Patient Education  2017 [employer].

## 2023-11-04 ENCOUNTER — Telehealth: Payer: Self-pay | Admitting: Pharmacy Technician

## 2023-11-04 ENCOUNTER — Other Ambulatory Visit (HOSPITAL_COMMUNITY): Payer: Self-pay

## 2023-11-04 NOTE — Telephone Encounter (Signed)
 Oral Oncology Patient Advocate Encounter   Received notification that patient is due for re-enrollment for assistance for Lenalidomide through [employer].   Re-enrollment process has been initiated and approved.  RxBin: [id number] PCN: [id number] Member ID: [id number] Group ID: [id number] Dates of Eligibility: 11/24/2023 through 11/22/2024   Fund:  Multiple Myeloma - Medicare Access    Patty Almedia Balls, CPhT Oncology Pharmacy Patient Advocate [facility] Direct Number: [phone] Fax: [phone]

## 2023-11-05 ENCOUNTER — Inpatient Hospital Stay (HOSPITAL_BASED_OUTPATIENT_CLINIC_OR_DEPARTMENT_OTHER): Admitting: Nurse Practitioner

## 2023-11-05 ENCOUNTER — Inpatient Hospital Stay

## 2023-11-05 ENCOUNTER — Encounter: Payer: Self-pay | Admitting: Nurse Practitioner

## 2023-11-05 VITALS — BP 162/76 | HR 65 | Temp 98.1°F | Resp 18 | Ht 70.0 in | Wt 273.5 lb

## 2023-11-05 VITALS — BP 160/70 | HR 60 | Temp 98.0°F | Resp 18

## 2023-11-05 DIAGNOSIS — C9000 Multiple myeloma not having achieved remission: Secondary | ICD-10-CM

## 2023-11-05 LAB — CMP (CANCER CENTER ONLY)
ALT: 17 U/L (ref 0–44)
AST: 16 U/L (ref 15–41)
Albumin: 4.3 g/dL (ref 3.5–5.0)
Alkaline Phosphatase: 50 U/L (ref 38–126)
Anion gap: 4 — ABNORMAL LOW (ref 5–15)
BUN: 19 mg/dL (ref 8–23)
CO2: 29 mmol/L (ref 22–32)
Calcium: 9.3 mg/dL (ref 8.9–10.3)
Chloride: 109 mmol/L (ref 98–111)
Creatinine: 0.93 mg/dL (ref 0.61–1.24)
GFR, Estimated: >60 mL/min (ref >60)
Glucose, Bld: 106 mg/dL — ABNORMAL HIGH (ref 70–99)
Potassium: 4.2 mmol/L (ref 3.5–5.1)
Sodium: 142 mmol/L (ref 135–145)
Total Bilirubin: 0.4 mg/dL (ref 0.0–1.2)
Total Protein: 6.6 g/dL (ref 6.5–8.1)

## 2023-11-05 LAB — CBC WITH DIFFERENTIAL (CANCER CENTER ONLY)
Abs Immature Granulocytes: 0.02 10*3/uL (ref 0.00–0.07)
Basophils Absolute: 0.1 10*3/uL (ref 0.0–0.1)
Basophils Relative: 1 %
Eosinophils Absolute: 0.3 10*3/uL (ref 0.0–0.5)
Eosinophils Relative: 4 %
HCT: 44.3 % (ref 39.0–52.0)
Hemoglobin: 14.5 g/dL (ref 13.0–17.0)
Immature Granulocytes: 0 %
Lymphocytes Relative: 33 %
Lymphs Abs: 2.4 10*3/uL (ref 0.7–4.0)
MCH: 29.7 pg (ref 26.0–34.0)
MCHC: 32.7 g/dL (ref 30.0–36.0)
MCV: 90.8 fL (ref 80.0–100.0)
Monocytes Absolute: 0.7 10*3/uL (ref 0.1–1.0)
Monocytes Relative: 10 %
Neutro Abs: 3.7 10*3/uL (ref 1.7–7.7)
Neutrophils Relative %: 52 %
Platelet Count: 166 10*3/uL (ref 150–400)
RBC: 4.88 MIL/uL (ref 4.22–5.81)
RDW: 14.8 % (ref 11.5–15.5)
WBC Count: 7.2 10*3/uL (ref 4.0–10.5)
nRBC: 0 % (ref 0.0–0.2)

## 2023-11-05 MED ORDER — DIPHENHYDRAMINE HCL 25 MG PO CAPS
25.0000 mg | ORAL_CAPSULE | Freq: Once | ORAL | Status: AC
Start: 2023-11-05 — End: 2023-11-05
  Administered 2023-11-05: 25 mg via ORAL
  Filled 2023-11-05: qty 1

## 2023-11-05 MED ORDER — DEXAMETHASONE 4 MG PO TABS
4.0000 mg | ORAL_TABLET | Freq: Once | ORAL | Status: AC
  Administered 2023-11-05: 4 mg via ORAL
  Filled 2023-11-05: qty 1

## 2023-11-05 MED ORDER — ACETAMINOPHEN 325 MG PO TABS
650.0000 mg | ORAL_TABLET | Freq: Once | ORAL | Status: AC
  Administered 2023-11-05: 650 mg via ORAL
  Filled 2023-11-05: qty 2

## 2023-11-05 MED ORDER — DARATUMUMAB-HYALURONIDASE-FIHJ 1800-30000 MG-UT/15ML ~~LOC~~ SOLN
1800.0000 mg | Freq: Once | SUBCUTANEOUS | Status: AC
  Administered 2023-11-05: 1800 mg via SUBCUTANEOUS
  Filled 2023-11-05: qty 15

## 2023-11-05 NOTE — Patient Instructions (Signed)
 [facility] - A DEPT OF [facility]  Discharge Instructions: Thank you for choosing [facility] to provide your oncology and hematology care.   If you have a lab appointment with the Cancer Center, please go directly to the Cancer Center and check in at the registration area.   Wear comfortable clothing and clothing appropriate for easy access to any Portacath or PICC line.   We strive to give you quality time with your provider. You may need to reschedule your appointment if you arrive late (15 or more minutes).  Arriving late affects you and other patients whose appointments are after yours.  Also, if you miss three or more appointments without notifying the office, you may be dismissed from the clinic at the provider's discretion.      For prescription refill requests, have your pharmacy contact our office and allow 72 hours for refills to be completed.    Today you received the following chemotherapy and/or immunotherapy agents Darzalex Faspro.  Daratumumab; Hyaluronidase Injection What is this medication? DARATUMUMAB; HYALURONIDASE (dar a toom ue mab; hye al ur ON i dase) treats multiple myeloma, a type of bone marrow cancer. Daratumumab works by blocking a protein that causes cancer cells to grow and multiply. This helps to slow or stop the spread of cancer cells. Hyaluronidase works by increasing the absorption of other medications in the body to help them work better. This medication may also be used treat amyloidosis, a condition that causes the buildup of a protein (amyloid) in your body. It works by reducing the buildup of this protein, which decreases symptoms. It is a combination medication that contains a monoclonal antibody. This medicine may be used for other purposes; ask your health care provider or pharmacist if you have questions. COMMON BRAND NAME(S): DARZALEX FASPRO What should I tell my care team before I take this  medication? They need to know if you have any of these conditions: Heart disease Infection, such as chickenpox, cold sores, herpes, hepatitis B Lung or breathing disease An unusual or allergic reaction to daratumumab, hyaluronidase, other medications, foods, dyes, or preservatives Pregnant or trying to get pregnant Breast-feeding How should I use this medication? This medication is injected under the skin. It is given by your care team in a hospital or clinic setting. Talk to your care team about the use of this medication in children. Special care may be needed. Overdosage: If you think you have taken too much of this medicine contact a poison control center or emergency room at once. NOTE: This medicine is only for you. Do not share this medicine with others. What if I miss a dose? Keep appointments for follow-up doses. It is important not to miss your dose. Call your care team if you are unable to keep an appointment. What may interact with this medication? Interactions have not been studied. This list may not describe all possible interactions. Give your health care provider a list of all the medicines, herbs, non-prescription drugs, or dietary supplements you use. Also tell them if you smoke, drink alcohol, or use illegal drugs. Some items may interact with your medicine. What should I watch for while using this medication? Your condition will be monitored carefully while you are receiving this medication. This medication can cause serious allergic reactions. To reduce your risk, your care team may give you other medication to take before receiving this one. Be sure to follow the directions from your care team. This  medication can affect the results of blood tests to match your blood type. These changes can last for up to 6 months after the final dose. Your care team will do blood tests to match your blood type before you start treatment. Tell all of your care team that you are being  treated with this medication before receiving a blood transfusion. This medication can affect the results of some tests used to determine treatment response; extra tests may be needed to evaluate response. Talk to your care team if you wish to become pregnant or think you are pregnant. This medication can cause serious birth defects if taken during pregnancy and for 3 months after the last dose. A reliable form of contraception is recommended while taking this medication and for 3 months after the last dose. Talk to your care team about effective forms of contraception. Do not breast-feed while taking this medication. What side effects may I notice from receiving this medication? Side effects that you should report to your care team as soon as possible: Allergic reactions--skin rash, itching, hives, swelling of the face, lips, tongue, or throat Heart rhythm changes--fast or irregular heartbeat, dizziness, feeling faint or lightheaded, chest pain, trouble breathing Infection--fever, chills, cough, sore throat, wounds that don't heal, pain or trouble when passing urine, general feeling of discomfort or being unwell Infusion reactions--chest pain, shortness of breath or trouble breathing, feeling faint or lightheaded Sudden eye pain or change in vision such as blurry vision, seeing halos around lights, vision loss Unusual bruising or bleeding Side effects that usually do not require medical attention (report to your care team if they continue or are bothersome): Constipation Diarrhea Fatigue Nausea Pain, tingling, or numbness in the hands or feet Swelling of the ankles, hands, or feet This list may not describe all possible side effects. Call your doctor for medical advice about side effects. You may report side effects to FDA at 1-800-FDA-1088. Where should I keep my medication? This medication is given in a hospital or clinic. It will not be stored at home. NOTE: This sheet is a summary. It may  not cover all possible information. If you have questions about this medicine, talk to your doctor, pharmacist, or health care provider.  2024 Elsevier/Gold Standard (2021-12-09 00:00:00)       To help prevent nausea and vomiting after your treatment, we encourage you to take your nausea medication as directed.  BELOW ARE SYMPTOMS THAT SHOULD BE REPORTED IMMEDIATELY: *FEVER GREATER THAN 100.4 F (38 C) OR HIGHER *CHILLS OR SWEATING *NAUSEA AND VOMITING THAT IS NOT CONTROLLED WITH YOUR NAUSEA MEDICATION *UNUSUAL SHORTNESS OF BREATH *UNUSUAL BRUISING OR BLEEDING *URINARY PROBLEMS (pain or burning when urinating, or frequent urination) *BOWEL PROBLEMS (unusual diarrhea, constipation, pain near the anus) TENDERNESS IN MOUTH AND THROAT WITH OR WITHOUT PRESENCE OF ULCERS (sore throat, sores in mouth, or a toothache) UNUSUAL RASH, SWELLING OR PAIN  UNUSUAL VAGINAL DISCHARGE OR ITCHING   Items with * indicate a potential emergency and should be followed up as soon as possible or go to the Emergency Department if any problems should occur.  Please show the CHEMOTHERAPY ALERT CARD or IMMUNOTHERAPY ALERT CARD at check-in to the Emergency Department and triage nurse.  Should you have questions after your visit or need to cancel or reschedule your appointment, please contact [facility]  Dept: [phone]  and follow the prompts.  Office hours are 8:00 a.m. to 4:30 p.m. Monday -  Friday. Please note that voicemails left after 4:00 p.m. may not be returned until the following business day.  We are closed weekends and major holidays. You have access to a nurse at all times for urgent questions. Please call the main number to the clinic Dept: [phone] and follow the prompts.   For any non-urgent questions, you may also contact your provider using MyChart. We now offer e-Visits for anyone 81 and older to request care online for non-urgent  symptoms. For details visit [email or web address].   Also download the MyChart app! Go to the app store, search "MyChart", open the app, select [place], and log in with your MyChart username and password.

## 2023-11-05 NOTE — Progress Notes (Signed)
 Patient seen by Lonna Cobb NP today  Vitals are within treatment parameters:Yes   Labs are within treatment parameters: Yes   Treatment plan has been signed: Yes   Per physician team, Patient is ready for treatment and there are NO modifications to the treatment plan.

## 2023-11-05 NOTE — Progress Notes (Signed)
 [facility] OFFICE PROGRESS NOTE   Diagnosis: Multiple myeloma  INTERVAL HISTORY:   Jeffery [facility]gins returns as scheduled.  He denies nausea/vomiting.  No mouth sores.  No diarrhea.  No rash.  He intermittently has right sided low back pain after certain activities, specifically heavy lifting.  He continues to note leg swelling.  He also notes his hands are swollen.  Sometimes at night he wakes up with "body aches".  He notes tooth "sensitivity".  No other dental issues.  He reports an upcoming dentist appointment.  Objective:  Vital signs in last 24 hours:  Blood pressure (!) 162/76, pulse 65, temperature 98.1 F (36.7 C), temperature source Temporal, resp. rate 18, height 5\' 10"  (1.778 m), weight 273 lb 8 oz (124.1 kg), SpO2 100%.    HEENT: No thrush or ulcers. Resp: Lungs clear bilaterally. Cardio: Regular rate and rhythm. GI: Abdomen soft and nontender.  No hepatosplenomegaly. Vascular: Trace edema lower leg bilaterally.  No discernible upper extremity edema. Skin: No rash.   Lab Results:  Lab Results  Component Value Date   WBC 7.2 11/05/2023   HGB 14.5 11/05/2023   HCT 44.3 11/05/2023   MCV 90.8 11/05/2023   PLT 166 11/05/2023   NEUTROABS 3.7 11/05/2023    Imaging:  No results found.  Medications: I have reviewed the patient's current medications.  Assessment/Plan:  Multiple myeloma-IgG kappa 12/14/2022-SPEP-3.1 g M spike in the gamma region, 11/25/2022-x-ray left shoulder-lytic lesion in the medial aspect of the humeral head and neck with cortical breakthrough CT lumbar spine 12/10/2022-scattered lucent lesions throughout the lumbar spine 12/16/2022-CT cervical spine-expansile destructive lesion at C3 12/17/2022-MRI brain-right 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 numerous enhancing lesions of the skull base and calvarium no evidence of extraosseous component 12/17/2022-MRI of the cervical, thoracic, and lumbar spine-diffuse  osseous metastatic disease throughout the spine, clivus, bilateral occipital condyles, ribs, sacrum, and iliac bone, no evidence of pathologic fracture, expansile lesion in C3 extends into the spinal canal by 4 mm with moderate to severe spinal canal stenosis posterior to C3, mild right foraminal narrowing at T2-T3 and T3-4 related to osseous tumor at T3 12/17/2022-SPEP-2.6 g serum M spike, IgG kappa Bone marrow biopsy 12/18/2022-multiple myeloma with plasma cells involving 60% of the cellular marrow, kappa restricted myeloma FISH panel-QNS Bone survey 12/18/2022-lucencies at the skull, right scapula, left clavicle, lumbar spine, pelvis, and left femoral neck.  Destructive lesion at C3, possible sclerotic lucent lesions in the thoracic spine 12/18/2022 pelvic radiation to C3 and humeral head-12/31/2022 Cycle 1 daratumumab-VRD 01/01/2023 (Velcade 3 weeks on/1 week off) Cycle 2 daratumumab-VRD 01/29/2023, Revlimid discontinued after 19 days due to a pruritic rash Cycle 3 daratumumab-VRD 02/26/2023, Revlimid dose reduced to 10 mg Cycle 4 daratumumab-VRD 03/26/2023, Revlimid 10 mg x 18 days, Dex changed to prednisone Monday Wednesday Friday due to rash; Revlimid placed on hold 04/08/2023 due to rash at the lower abdominal wall and pruritus Cycle 5 daratumumab/VRD 04/23/2023-patient declines to resume Revlimid due to rash/pruritus; Velcade placed on hold 04/23/2023 due to progressive neuropathy; proceeded with daratumumab and dexamethasone 04/28/2023 appointment with Dr. Melrose Nakayama [facility]-recommend continuing daratumumab and dexamethasone.  Would attempt to resume lenalidomide 5 mg days 1 through 21 with addition of daily famotidine and as needed topical steroids; escalate back to 10 mg if tolerated; dexamethasone 20 mg p.o. weekly.  Cycle length 28 days. Cycle 6 Revlimid/dexamethasone plus daratumumab 05/07/2023 (Revlimid 5 mg days 1 through 21, 28-day cycle; dexamethasone 10 mg weekly (unable to tolerate higher  dose of dexamethasone  due to emotional lability); Pepcid 20 mg daily). Zometa monthly during induction therapy then every 3 months x 2 years Cycle 7 Revlimid/Decadron plus daratumumab 06/04/2023 (Revlimid 5 mg days 1-21, 28-day cycle, Decadron 4 mg weekly (Decadron further reduced secondary to emotional lability) Cycle 8 Revlimid/Decadron plus daratumumab 07/02/2023 Cycle 9 Revlimid/Decadron plus daratumumab 07/30/2023 (he will discontinue home steroids) Myeloma panel at [facility] 07/28/2023, IgG kappa protein, M spike too low to quantify Cycle 10 Revlimid/Decadron plus daratumumab 08/27/2023 Cycle 11 Revlimid/daratumumab-09/24/2023, Revlimid starting 09/25/2023 Bone marrow biopsy at [facility] 09/29/2023-normocellular bone marrow with trilineage hematopoiesis and 1% blasts by manual aspirate differential.  No morphologic or overt immunophenotypic evidence of plasma cell neoplasm.  No monotypic plasma cells identified (MRD negative). Treatment held 10/22/2023 per patient request Appointment with Dr. Melrose Nakayama 11/03/2023-recommends continuing daratumumab monthly and lenalidomide 3 weeks out of 4 indefinitely if effective.  Zometa 4 mg every 3 months for a total of 2 years; restaging myeloma labs every 3 months. Cycle 12 Revlimid/daratumumab 11/05/2023, Revlimid starting 11/06/2023   2.  Left shoulder pain-likely secondary to a lytic lesion at the left humeral head/neck 3.  Left chin/distal mandible numbness-likely secondary to myeloma compressing nerve roots at the brainstem or left face 4.  Hypertension 5.  Diabetes 6.  Sleep apnea 7.  Nephrolithiasis-right ureteral calculus urinary tract infection, placement of a right ureter stent right stone dislodgment 10/01/2022 8.  Asthma 9.  Report of rash 04/08/2023-location and appearance 04/23/2023 consistent with skin change related to the Velcade injection. 10.  Lower extremity edema-venous Doppler negative for DVT in bilateral lower extremity 10/22/2023.   Disposition: Jeffery Higgins appears stable.   He requested to place treatment on hold at the time of his last visit and follow-up with Dr. Melrose Nakayama.  He understands and agrees with Dr. Ross Ludwig recommendations to continue monthly daratumumab and lenalidomide as he is currently taking, indefinitely, if effective.  We also reviewed the recommendation for Zometa 4 mg every 3 months for a total of 2 years.  He agrees with this plan.  He plans to begin a new cycle of Revlimid 11/06/2023.  He agrees to proceed with daratumumab today as scheduled.  CBC and chemistry panel reviewed.  Labs adequate to proceed as above.  Last Zometa infusion was 09/24/2023.  Plan to continue every 3 months.  He has an upcoming appointment with his dentist.  He will be due for a restaging bone survey in early May.  Order placed at today's visit.  He will follow-up with his PCP regarding the edema.  Lower extremity Dopplers were negative for DVT on 10/22/2023.  He will return for follow-up and daratumumab in 4 weeks.  We are available to see him sooner if needed.  Patient seen with Dr. Truett Perna.   Lonna Cobb ANP/GNP-BC   11/05/2023  12:47 PM  This was a shared visit with Lonna Cobb.  Jeffery Higgins was interviewed and examined.  He is in clinical remission from multiple myeloma.  He saw Dr. Melrose Nakayama this week.  He recommends continuing daratumumab/Revlimid maintenance.  Jeffery Higgins understands the med he has received is not curative.  We discussed the recommendation for maintenance therapy.  He we will continue every 36-month Zometa.  He plans to see his dentist to evaluate sensitivity at 1 tooth.  I was present for greater than 50% of today's visit.  I performed medical decision making.  Mancel Bale, MD

## 2023-11-05 NOTE — Progress Notes (Signed)
 Patient presents today for chemotherapy infusion of Dazalex Faspro. Patient is in satisfactory condition with no new complaints voiced.  Vital signs are stable.  Labs reviewed by Lonna Cobb NP during the office visit and all labs are within treatment parameters.  We will proceed with treatment per NP/MD orders.   Patient tolerated treatment well with no complaints voiced.  Patient left ambulatory in stable condition.  Vital signs stable at discharge.  Follow up as scheduled.

## 2023-11-26 ENCOUNTER — Other Ambulatory Visit: Payer: Self-pay | Admitting: Oncology

## 2023-11-30 ENCOUNTER — Other Ambulatory Visit: Payer: Self-pay | Admitting: Oncology

## 2023-12-03 ENCOUNTER — Inpatient Hospital Stay

## 2023-12-03 ENCOUNTER — Inpatient Hospital Stay (HOSPITAL_BASED_OUTPATIENT_CLINIC_OR_DEPARTMENT_OTHER): Admitting: Oncology

## 2023-12-03 ENCOUNTER — Inpatient Hospital Stay: Attending: Nurse Practitioner

## 2023-12-03 VITALS — BP 142/78 | HR 78 | Temp 98.1°F | Resp 18 | Ht 70.0 in | Wt 272.3 lb

## 2023-12-03 DIAGNOSIS — G4730 Sleep apnea, unspecified: Secondary | ICD-10-CM | POA: Insufficient documentation

## 2023-12-03 DIAGNOSIS — I10 Essential (primary) hypertension: Secondary | ICD-10-CM | POA: Diagnosis not present

## 2023-12-03 DIAGNOSIS — N200 Calculus of kidney: Secondary | ICD-10-CM | POA: Insufficient documentation

## 2023-12-03 DIAGNOSIS — E1140 Type 2 diabetes mellitus with diabetic neuropathy, unspecified: Secondary | ICD-10-CM | POA: Insufficient documentation

## 2023-12-03 DIAGNOSIS — G893 Neoplasm related pain (acute) (chronic): Secondary | ICD-10-CM | POA: Diagnosis not present

## 2023-12-03 DIAGNOSIS — J45909 Unspecified asthma, uncomplicated: Secondary | ICD-10-CM | POA: Insufficient documentation

## 2023-12-03 DIAGNOSIS — N390 Urinary tract infection, site not specified: Secondary | ICD-10-CM | POA: Diagnosis not present

## 2023-12-03 DIAGNOSIS — C9000 Multiple myeloma not having achieved remission: Secondary | ICD-10-CM

## 2023-12-03 DIAGNOSIS — R600 Localized edema: Secondary | ICD-10-CM | POA: Diagnosis not present

## 2023-12-03 DIAGNOSIS — Z5112 Encounter for antineoplastic immunotherapy: Secondary | ICD-10-CM | POA: Insufficient documentation

## 2023-12-03 LAB — CMP (CANCER CENTER ONLY)
ALT: 18 U/L (ref 0–44)
AST: 17 U/L (ref 15–41)
Albumin: 4.3 g/dL (ref 3.5–5.0)
Alkaline Phosphatase: 47 U/L (ref 38–126)
Anion gap: 7 (ref 5–15)
BUN: 18 mg/dL (ref 8–23)
CO2: 26 mmol/L (ref 22–32)
Calcium: 9 mg/dL (ref 8.9–10.3)
Chloride: 105 mmol/L (ref 98–111)
Creatinine: 1 mg/dL (ref 0.61–1.24)
GFR, Estimated: >60 mL/min (ref >60)
Glucose, Bld: 169 mg/dL — ABNORMAL HIGH (ref 70–99)
Potassium: 4.1 mmol/L (ref 3.5–5.1)
Sodium: 138 mmol/L (ref 135–145)
Total Bilirubin: 0.6 mg/dL (ref 0.0–1.2)
Total Protein: 6.3 g/dL — ABNORMAL LOW (ref 6.5–8.1)

## 2023-12-03 LAB — CBC WITH DIFFERENTIAL (CANCER CENTER ONLY)
Abs Immature Granulocytes: 0.01 10*3/uL (ref 0.00–0.07)
Basophils Absolute: 0.1 10*3/uL (ref 0.0–0.1)
Basophils Relative: 1 %
Eosinophils Absolute: 0.2 10*3/uL (ref 0.0–0.5)
Eosinophils Relative: 4 %
HCT: 43.4 % (ref 39.0–52.0)
Hemoglobin: 14.5 g/dL (ref 13.0–17.0)
Immature Granulocytes: 0 %
Lymphocytes Relative: 30 %
Lymphs Abs: 1.9 10*3/uL (ref 0.7–4.0)
MCH: 30.3 pg (ref 26.0–34.0)
MCHC: 33.4 g/dL (ref 30.0–36.0)
MCV: 90.8 fL (ref 80.0–100.0)
Monocytes Absolute: 0.8 10*3/uL (ref 0.1–1.0)
Monocytes Relative: 12 %
Neutro Abs: 3.4 10*3/uL (ref 1.7–7.7)
Neutrophils Relative %: 53 %
Platelet Count: 182 10*3/uL (ref 150–400)
RBC: 4.78 MIL/uL (ref 4.22–5.81)
RDW: 15.2 % (ref 11.5–15.5)
WBC Count: 6.4 10*3/uL (ref 4.0–10.5)
nRBC: 0 % (ref 0.0–0.2)

## 2023-12-03 MED ORDER — DARATUMUMAB-HYALURONIDASE-FIHJ 1800-30000 MG-UT/15ML ~~LOC~~ SOLN
1800.0000 mg | Freq: Once | SUBCUTANEOUS | Status: AC
  Administered 2023-12-03: 1800 mg via SUBCUTANEOUS
  Filled 2023-12-03: qty 15

## 2023-12-03 MED ORDER — DIPHENHYDRAMINE HCL 25 MG PO CAPS
25.0000 mg | ORAL_CAPSULE | Freq: Once | ORAL | Status: AC
  Administered 2023-12-03: 25 mg via ORAL
  Filled 2023-12-03: qty 1

## 2023-12-03 MED ORDER — DEXAMETHASONE 4 MG PO TABS
4.0000 mg | ORAL_TABLET | Freq: Once | ORAL | Status: AC
  Administered 2023-12-03: 4 mg via ORAL
  Filled 2023-12-03: qty 1

## 2023-12-03 MED ORDER — ACETAMINOPHEN 325 MG PO TABS
650.0000 mg | ORAL_TABLET | Freq: Once | ORAL | Status: AC
  Administered 2023-12-03: 650 mg via ORAL
  Filled 2023-12-03: qty 2

## 2023-12-03 NOTE — Progress Notes (Signed)
 [facility] OFFICE PROGRESS NOTE   Diagnosis: Multiple myeloma  INTERVAL HISTORY:   Mr Heber returns as scheduled.  He continues maintenance Revlimid /daratumumab .  No nausea or diarrhea.  He started Cymbalta for neuropathy symptoms.  He reports increased discomfort at the neck.  He has weakness of the right leg.  He complains of chills and bodyaches at night for the past 2 months.  No tooth pain or loosening.  He recently had a crown placed. Objective:  Vital signs in last 24 hours:  Blood pressure (!) 142/78, pulse 78, temperature 98.1 F (36.7 C), temperature source Temporal, resp. rate 18, height 5' 10 (1.778 m), weight 272 lb 4.8 oz (123.5 kg), SpO2 98%.    HEENT: No thrush or ulcers Resp: Lungs clear bilaterally Cardio: Regular rate and rhythm GI: No hepatosplenomegaly Vascular: Trace lower leg edema bilaterally Neuro: The motor exam appears intact at the legs and feet bilaterally    Portacath/PICC-without erythema  Lab Results:  Lab Results  Component Value Date   WBC 6.4 12/03/2023   HGB 14.5 12/03/2023   HCT 43.4 12/03/2023   MCV 90.8 12/03/2023   PLT 182 12/03/2023   NEUTROABS 3.4 12/03/2023    CMP  Lab Results  Component Value Date   NA 142 11/05/2023   K 4.2 11/05/2023   CL 109 11/05/2023   CO2 29 11/05/2023   GLUCOSE 106 (H) 11/05/2023   BUN 19 11/05/2023   CREATININE 0.93 11/05/2023   CALCIUM 9.3 11/05/2023   PROT 6.6 11/05/2023   ALBUMIN 4.3 11/05/2023   AST 16 11/05/2023   ALT 17 11/05/2023   ALKPHOS 50 11/05/2023   BILITOT 0.4 11/05/2023   GFRNONAA >60 11/05/2023   GFRAA >90 08/02/2012    Medications: I have reviewed the patient's current medications.   Assessment/Plan: Multiple myeloma-IgG kappa 12/14/2022-SPEP-3.1 g M spike in the gamma region, 11/25/2022-x-ray left shoulder-lytic lesion in the medial aspect of the humeral head and neck with cortical breakthrough CT lumbar spine 12/10/2022-scattered lucent lesions  throughout the lumbar spine 12/16/2022-CT cervical spine-expansile destructive lesion at C3 12/17/2022-MRI brain-right 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 numerous enhancing lesions of the skull base and calvarium no evidence of extraosseous component 12/17/2022-MRI of the cervical, thoracic, and lumbar spine-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, clivus, bilateral occipital condyles, ribs, sacrum, and iliac bone, no evidence of pathologic fracture, expansile lesion in C3 extends into the spinal canal by 4 mm with moderate to severe spinal canal stenosis posterior to C3, mild right foraminal narrowing at T2-T3 and T3-4 related to osseous tumor at T3 12/17/2022-SPEP-2.6 g serum M spike, IgG kappa Bone marrow biopsy 12/18/2022-multiple myeloma with plasma cells involving 60% of the cellular marrow, kappa restricted myeloma FISH panel-QNS Bone survey 12/18/2022-lucencies at the skull, right scapula, left clavicle, lumbar spine, pelvis, and left femoral neck.  Destructive lesion at C3, possible sclerotic lucent lesions in the thoracic spine 12/18/2022 pelvic radiation to C3 and humeral head-12/31/2022 Cycle 1 daratumumab -VRD 01/01/2023 (Velcade  3 weeks on/1 week off) Cycle 2 daratumumab -VRD 01/29/2023, Revlimid  discontinued after 19 days due to a pruritic rash Cycle 3 daratumumab -VRD 02/26/2023, Revlimid  dose reduced to 10 mg Cycle 4 daratumumab -VRD 03/26/2023, Revlimid  10 mg x 18 days, Dex changed to prednisone  Monday Wednesday Friday due to rash; Revlimid  placed on hold 04/08/2023 due to rash at the lower abdominal wall and pruritus Cycle 5 daratumumab /VRD 04/23/2023-patient declines to resume Revlimid  due to rash/pruritus; Velcade  placed on hold 04/23/2023 due to progressive neuropathy; proceeded  with daratumumab  and dexamethasone  04/28/2023 appointment with Dr. Russella [facility]-recommend continuing daratumumab  and dexamethasone .  Would attempt to resume lenalidomide  5 mg days 1  through 21 with addition of daily famotidine  and as needed topical steroids; escalate back to 10 mg if tolerated; dexamethasone  20 mg p.o. weekly.  Cycle length 28 days. Cycle 6 Revlimid /dexamethasone  plus daratumumab  05/07/2023 (Revlimid  5 mg days 1 through 21, 28-day cycle; dexamethasone  10 mg weekly (unable to tolerate higher dose of dexamethasone  due to emotional lability); Pepcid  20 mg daily). Zometa  monthly during induction therapy then every 3 months x 2 years Cycle 7 Revlimid /Decadron  plus daratumumab  06/04/2023 (Revlimid  5 mg days 1-21, 28-day cycle, Decadron  4 mg weekly (Decadron  further reduced secondary to emotional lability) Cycle 8 Revlimid /Decadron  plus daratumumab  07/02/2023 Cycle 9 Revlimid /Decadron  plus daratumumab  07/30/2023 (he will discontinue home steroids) Myeloma panel at [facility] 07/28/2023, IgG kappa protein, M spike too low to quantify Cycle 10 Revlimid /Decadron  plus daratumumab  08/27/2023 Cycle 11 Revlimid /daratumumab -09/24/2023, Revlimid  starting 09/25/2023 Bone marrow biopsy at [facility] 09/29/2023-normocellular bone marrow with trilineage hematopoiesis and 1% blasts by manual aspirate differential.  No morphologic or overt immunophenotypic evidence of plasma cell neoplasm.  No monotypic plasma cells identified (MRD negative). Treatment held 10/22/2023 per patient request Appointment with Dr. Russella 11/03/2023-recommends continuing daratumumab  monthly and lenalidomide  3 weeks out of 4 indefinitely if effective.  Zometa  4 mg every 3 months for a total of 2 years; restaging myeloma labs every 3 months. Cycle 12 Revlimid /daratumumab  11/05/2023, Revlimid  starting 11/06/2023   2.  Left shoulder pain-likely secondary to a lytic lesion at the left humeral head/neck 3.  Left chin/distal mandible numbness-likely secondary to myeloma compressing nerve roots at the brainstem or left face 4.  Hypertension 5.  Diabetes 6.  Sleep apnea 7.  Nephrolithiasis-right ureteral calculus urinary tract  infection, placement of a right ureter stent right stone dislodgment 10/01/2022 8.  Asthma 9.  Report of rash 04/08/2023-location and appearance 04/23/2023 consistent with skin change related to the Velcade  injection. 10.  Lower extremity edema-venous Doppler negative for DVT in bilateral lower extremity 10/22/2023.     Disposition: Mr Kendra appears stable.  He continues Revlimid /daratumumab  maintenance.  He will complete treatment with daratumumab  today. The serum M spike was not detected last month.  We will check an IgG level and serum M spike when he returns next month.  He will return for an office visit, daratumumab , and Zometa  in 4 weeks.  He will have a metastatic bone survey in 4 weeks. Arley Hof, MD  12/03/2023  8:48 AM

## 2023-12-03 NOTE — Progress Notes (Signed)
 Patient seen by Dr. Thornton Papas today  Vitals are within treatment parameters:Yes   Labs are within treatment parameters: Yes   Treatment plan has been signed: Yes   Per physician team, Patient is ready for treatment and there are NO modifications to the treatment plan.

## 2023-12-03 NOTE — Patient Instructions (Signed)
 [facility]   Discharge Instructions: Thank you for choosing [facility] to provide your oncology and hematology care.   If you have a lab appointment with the Cancer Center, please go directly to the Cancer Center and check in at the registration area.   Wear comfortable clothing and clothing appropriate for easy access to any Portacath or PICC line.   We strive to give you quality time with your provider. You may need to reschedule your appointment if you arrive late (15 or more minutes).  Arriving late affects you and other patients whose appointments are after yours.  Also, if you miss three or more appointments without notifying the office, you may be dismissed from the clinic at the provider's discretion.      For prescription refill requests, have your pharmacy contact our office and allow 72 hours for refills to be completed.    Today you received the following chemotherapy and/or immunotherapy agents DARZALEX-FASPRO      To help prevent nausea and vomiting after your treatment, we encourage you to take your nausea medication as directed.  BELOW ARE SYMPTOMS THAT SHOULD BE REPORTED IMMEDIATELY: *FEVER GREATER THAN 100.4 F (38 C) OR HIGHER *CHILLS OR SWEATING *NAUSEA AND VOMITING THAT IS NOT CONTROLLED WITH YOUR NAUSEA MEDICATION *UNUSUAL SHORTNESS OF BREATH *UNUSUAL BRUISING OR BLEEDING *URINARY PROBLEMS (pain or burning when urinating, or frequent urination) *BOWEL PROBLEMS (unusual diarrhea, constipation, pain near the anus) TENDERNESS IN MOUTH AND THROAT WITH OR WITHOUT PRESENCE OF ULCERS (sore throat, sores in mouth, or a toothache) UNUSUAL RASH, SWELLING OR PAIN  UNUSUAL VAGINAL DISCHARGE OR ITCHING   Items with * indicate a potential emergency and should be followed up as soon as possible or go to the Emergency Department if any problems should occur.  Please show the CHEMOTHERAPY ALERT CARD or  IMMUNOTHERAPY ALERT CARD at check-in to the Emergency Department and triage nurse.  Should you have questions after your visit or need to cancel or reschedule your appointment, please contact [facility]  Dept: [phone]  and follow the prompts.  Office hours are 8:00 a.m. to 4:30 p.m. Monday - Friday. Please note that voicemails left after 4:00 p.m. may not be returned until the following business day.  We are closed weekends and major holidays. You have access to a nurse at all times for urgent questions. Please call the main number to the clinic Dept: [phone] and follow the prompts.   For any non-urgent questions, you may also contact your provider using MyChart. We now offer e-Visits for anyone 79 and older to request care online for non-urgent symptoms. For details visit [email or web address].   Also download the MyChart app! Go to the app store, search "MyChart", open the app, select [facility], and log in with your MyChart username and password.  Daratumumab; Hyaluronidase Injection What is this medication? DARATUMUMAB; HYALURONIDASE (dar a toom ue mab; hye al ur ON i dase) treats multiple myeloma, a type of bone marrow cancer. Daratumumab works by blocking a protein that causes cancer cells to grow and multiply. This helps to slow or stop the spread of cancer cells. Hyaluronidase works by increasing the absorption of other medications in the body to help them work better. This medication may also be used treat amyloidosis, a condition that causes the buildup of a protein (amyloid) in your body. It works by  reducing the buildup of this protein, which decreases symptoms. It is a combination medication that contains a monoclonal antibody. This medicine may be used for other purposes; ask your health care provider or pharmacist if you have questions. COMMON BRAND NAME(S): DARZALEX FASPRO What should I tell my care team before I take  this medication? They need to know if you have any of these conditions: Heart disease Infection, such as chickenpox, cold sores, herpes, hepatitis B Lung or breathing disease An unusual or allergic reaction to daratumumab, hyaluronidase, other medications, foods, dyes, or preservatives Pregnant or trying to get pregnant Breast-feeding How should I use this medication? This medication is injected under the skin. It is given by your care team in a hospital or clinic setting. Talk to your care team about the use of this medication in children. Special care may be needed. Overdosage: If you think you have taken too much of this medicine contact a poison control center or emergency room at once. NOTE: This medicine is only for you. Do not share this medicine with others. What if I miss a dose? Keep appointments for follow-up doses. It is important not to miss your dose. Call your care team if you are unable to keep an appointment. What may interact with this medication? Interactions have not been studied. This list may not describe all possible interactions. Give your health care provider a list of all the medicines, herbs, non-prescription drugs, or dietary supplements you use. Also tell them if you smoke, drink alcohol, or use illegal drugs. Some items may interact with your medicine. What should I watch for while using this medication? Your condition will be monitored carefully while you are receiving this medication. This medication can cause serious allergic reactions. To reduce your risk, your care team may give you other medication to take before receiving this one. Be sure to follow the directions from your care team. This medication can affect the results of blood tests to match your blood type. These changes can last for up to 6 months after the final dose. Your care team will do blood tests to match your blood type before you start treatment. Tell all of your care team that you are being  treated with this medication before receiving a blood transfusion. This medication can affect the results of some tests used to determine treatment response; extra tests may be needed to evaluate response. Talk to your care team if you wish to become pregnant or think you are pregnant. This medication can cause serious birth defects if taken during pregnancy and for 3 months after the last dose. A reliable form of contraception is recommended while taking this medication and for 3 months after the last dose. Talk to your care team about effective forms of contraception. Do not breast-feed while taking this medication. What side effects may I notice from receiving this medication? Side effects that you should report to your care team as soon as possible: Allergic reactions--skin rash, itching, hives, swelling of the face, lips, tongue, or throat Heart rhythm changes--fast or irregular heartbeat, dizziness, feeling faint or lightheaded, chest pain, trouble breathing Infection--fever, chills, cough, sore throat, wounds that don't heal, pain or trouble when passing urine, general feeling of discomfort or being unwell Infusion reactions--chest pain, shortness of breath or trouble breathing, feeling faint or lightheaded Sudden eye pain or change in vision such as blurry vision, seeing halos around lights, vision loss Unusual bruising or bleeding Side effects that usually do not require medical attention (  report to your care team if they continue or are bothersome): Constipation Diarrhea Fatigue Nausea Pain, tingling, or numbness in the hands or feet Swelling of the ankles, hands, or feet This list may not describe all possible side effects. Call your doctor for medical advice about side effects. You may report side effects to FDA at 1-800-FDA-1088. Where should I keep my medication? This medication is given in a hospital or clinic. It will not be stored at home. NOTE: This sheet is a summary. It may  not cover all possible information. If you have questions about this medicine, talk to your doctor, pharmacist, or health care provider.  2024 Elsevier/Gold Standard (2021-12-09 00:00:00)

## 2023-12-21 ENCOUNTER — Other Ambulatory Visit: Payer: Self-pay | Admitting: Oncology

## 2023-12-30 DIAGNOSIS — I5020 Unspecified systolic (congestive) heart failure: Secondary | ICD-10-CM | POA: Insufficient documentation

## 2023-12-31 ENCOUNTER — Encounter: Payer: Self-pay | Admitting: Nurse Practitioner

## 2023-12-31 ENCOUNTER — Inpatient Hospital Stay

## 2023-12-31 ENCOUNTER — Inpatient Hospital Stay: Admitting: Nurse Practitioner

## 2023-12-31 ENCOUNTER — Inpatient Hospital Stay: Attending: Nurse Practitioner

## 2023-12-31 VITALS — BP 129/69 | HR 75 | Temp 98.1°F | Resp 18 | Ht 70.0 in | Wt 263.0 lb

## 2023-12-31 DIAGNOSIS — Z79899 Other long term (current) drug therapy: Secondary | ICD-10-CM | POA: Diagnosis not present

## 2023-12-31 DIAGNOSIS — Z87440 Personal history of urinary (tract) infections: Secondary | ICD-10-CM | POA: Insufficient documentation

## 2023-12-31 DIAGNOSIS — Z7961 Long term (current) use of immunomodulator: Secondary | ICD-10-CM | POA: Insufficient documentation

## 2023-12-31 DIAGNOSIS — Z9221 Personal history of antineoplastic chemotherapy: Secondary | ICD-10-CM | POA: Insufficient documentation

## 2023-12-31 DIAGNOSIS — C9000 Multiple myeloma not having achieved remission: Secondary | ICD-10-CM | POA: Diagnosis not present

## 2023-12-31 DIAGNOSIS — G4730 Sleep apnea, unspecified: Secondary | ICD-10-CM | POA: Insufficient documentation

## 2023-12-31 DIAGNOSIS — M549 Dorsalgia, unspecified: Secondary | ICD-10-CM | POA: Diagnosis not present

## 2023-12-31 DIAGNOSIS — E1140 Type 2 diabetes mellitus with diabetic neuropathy, unspecified: Secondary | ICD-10-CM | POA: Insufficient documentation

## 2023-12-31 DIAGNOSIS — M542 Cervicalgia: Secondary | ICD-10-CM | POA: Diagnosis not present

## 2023-12-31 DIAGNOSIS — I110 Hypertensive heart disease with heart failure: Secondary | ICD-10-CM | POA: Insufficient documentation

## 2023-12-31 LAB — CBC WITH DIFFERENTIAL (CANCER CENTER ONLY)
Abs Immature Granulocytes: 0.02 10*3/uL (ref 0.00–0.07)
Basophils Absolute: 0.1 10*3/uL (ref 0.0–0.1)
Basophils Relative: 1 %
Eosinophils Absolute: 0.4 10*3/uL (ref 0.0–0.5)
Eosinophils Relative: 5 %
HCT: 47.1 % (ref 39.0–52.0)
Hemoglobin: 15.8 g/dL (ref 13.0–17.0)
Immature Granulocytes: 0 %
Lymphocytes Relative: 29 %
Lymphs Abs: 2.1 10*3/uL (ref 0.7–4.0)
MCH: 30.4 pg (ref 26.0–34.0)
MCHC: 33.5 g/dL (ref 30.0–36.0)
MCV: 90.6 fL (ref 80.0–100.0)
Monocytes Absolute: 0.8 10*3/uL (ref 0.1–1.0)
Monocytes Relative: 12 %
Neutro Abs: 3.7 10*3/uL (ref 1.7–7.7)
Neutrophils Relative %: 53 %
Platelet Count: 160 10*3/uL (ref 150–400)
RBC: 5.2 MIL/uL (ref 4.22–5.81)
RDW: 14.4 % (ref 11.5–15.5)
WBC Count: 7.1 10*3/uL (ref 4.0–10.5)
nRBC: 0 % (ref 0.0–0.2)

## 2023-12-31 LAB — CMP (CANCER CENTER ONLY)
ALT: 18 U/L (ref 0–44)
AST: 17 U/L (ref 15–41)
Albumin: 4.3 g/dL (ref 3.5–5.0)
Alkaline Phosphatase: 62 U/L (ref 38–126)
Anion gap: 10 (ref 5–15)
BUN: 19 mg/dL (ref 8–23)
CO2: 27 mmol/L (ref 22–32)
Calcium: 9.6 mg/dL (ref 8.9–10.3)
Chloride: 102 mmol/L (ref 98–111)
Creatinine: 1.09 mg/dL (ref 0.61–1.24)
GFR, Estimated: >60 mL/min (ref >60)
Glucose, Bld: 150 mg/dL — ABNORMAL HIGH (ref 70–99)
Potassium: 4.2 mmol/L (ref 3.5–5.1)
Sodium: 140 mmol/L (ref 135–145)
Total Bilirubin: 0.5 mg/dL (ref 0.0–1.2)
Total Protein: 6.4 g/dL — ABNORMAL LOW (ref 6.5–8.1)

## 2023-12-31 MED ORDER — ZOLEDRONIC ACID 4 MG/100ML IV SOLN
4.0000 mg | Freq: Once | INTRAVENOUS | Status: AC
  Administered 2023-12-31: 4 mg via INTRAVENOUS
  Filled 2023-12-31: qty 100

## 2023-12-31 NOTE — Patient Instructions (Signed)

## 2023-12-31 NOTE — Progress Notes (Signed)
 Patient seen by Diana Forster NP today  Vitals are within treatment parameters:Yes   Labs are within treatment parameters: Yes   Treatment plan has been signed: Yes   Per physician team, Patient is ready for treatment. Please note the following modifications:only Zometa 

## 2023-12-31 NOTE — Progress Notes (Addendum)
 [facility] OFFICE PROGRESS NOTE   Diagnosis: Multiple myeloma  INTERVAL HISTORY:   Jeffery Higgins returns as scheduled.  He continues maintenance Revlimid /daratumumab .  He is currently on the week break from Revlimid , scheduled to resume another cycle 01/02/2024.  He denies nausea/vomiting.  No diarrhea.  No rash.  No shortness of breath or wheezing after daratumumab .  He feels neuropathy symptoms are worse in his feet and now present in the hands.  He continues to have neck and back pain.  He reports he has been diagnosed with heart failure.  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  MRI lumbar spine 12/15/2023 with resolution of the previously seen enhancing marrow space lesions throughout the lumbar spine; only potentially worrisome focal bone finding is a 7 mm focus of T2 signal and enhancement within the S2 segment that is indeterminant as a solitary finding; disc degeneration and spinal stenosis noted.  He had an echocardiogram completed 12/10/2023 with LVEF 35 to 40%.  He is being followed in a heart failure clinic.   Objective:  Vital signs in last 24 hours:  Blood pressure 129/69, pulse 75, temperature 98.1 F (36.7 C), temperature source Temporal, resp. rate 18, height 5\' 10"  (1.778 m), weight 263 lb (119.3 kg), SpO2 98%.    Resp: Lungs clear bilaterally. Cardio: Regular rate and rhythm. GI: No hepatosplenomegaly. Vascular: No leg edema. Neuro: Vibratory sense mildly decreased over the fingertips per tuning fork exam. Skin: No rash.   Lab Results:  Lab Results  Component Value Date   WBC 7.1 12/31/2023   HGB 15.8 12/31/2023   HCT 47.1 12/31/2023   MCV 90.6 12/31/2023   PLT 160 12/31/2023   NEUTROABS 3.7 12/31/2023    Imaging:  No results  found.  Medications: I have reviewed the patient's current medications.  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 numerous enhancing lesions of the skull base and calvarium no evidence of extraosseous component 12/17/2022-MRI of the cervical, thoracic, and lumbar spine-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, clivus, bilateral occipital condyles, ribs, sacrum, and iliac bone, no evidence of pathologic fracture, expansile lesion in C3 extends into the spinal canal by 4 mm with moderate to severe spinal canal stenosis posterior to C3, mild right foraminal narrowing at T2-T3 and T3-4 related to osseous tumor at T3 12/17/2022-SPEP-2.6 g serum M spike, IgG kappa Bone marrow biopsy 12/18/2022-multiple myeloma with plasma cells involving 60% of the cellular marrow, kappa restricted myeloma FISH panel-QNS Bone survey 12/18/2022-lucencies at the skull, right scapula, left clavicle, lumbar spine, pelvis, and left femoral neck.  Destructive lesion at C3, possible sclerotic lucent lesions in the thoracic spine 12/18/2022 pelvic radiation to C3 and humeral head-12/31/2022 Cycle 1 daratumumab -VRD 01/01/2023 (Velcade  3 weeks on/1 week off) Cycle 2 daratumumab -VRD 01/29/2023, Revlimid  discontinued after 19 days due to a pruritic rash Cycle 3 daratumumab -VRD 02/26/2023, Revlimid  dose reduced to 10 mg Cycle 4 daratumumab -VRD 03/26/2023, Revlimid  10 mg x 18 days, Dex changed to prednisone  Monday Wednesday Friday due to rash; Revlimid  placed on hold 04/08/2023 due to rash  at the lower abdominal wall and pruritus Cycle  5 daratumumab /VRD 04/23/2023-patient declines to resume Revlimid  due to rash/pruritus; Velcade  placed on hold 04/23/2023 due to progressive neuropathy; proceeded with daratumumab  and dexamethasone  04/28/2023 appointment with Dr. Jeppie Moles [facility]-recommend continuing daratumumab  and dexamethasone .  Would attempt to resume lenalidomide  5 mg days 1 through 21 with addition of daily famotidine  and as needed topical steroids; escalate back to 10 mg if tolerated; dexamethasone  20 mg p.o. weekly.  Cycle length 28 days. Cycle 6 Revlimid /dexamethasone  plus daratumumab  05/07/2023 (Revlimid  5 mg days 1 through 21, 28-day cycle; dexamethasone  10 mg weekly (unable to tolerate higher dose of dexamethasone  due to emotional lability); Pepcid  20 mg daily). Zometa  monthly during induction therapy then every 3 months x 2 years Cycle 7 Revlimid /Decadron  plus daratumumab  06/04/2023 (Revlimid  5 mg days 1-21, 28-day cycle, Decadron  4 mg weekly (Decadron  further reduced secondary to emotional lability) Cycle 8 Revlimid /Decadron  plus daratumumab  07/02/2023 Cycle 9 Revlimid /Decadron  plus daratumumab  07/30/2023 (he will discontinue home steroids) Myeloma panel at [facility] 07/28/2023, IgG kappa protein, M spike too low to quantify Cycle 10 Revlimid /Decadron  plus daratumumab  08/27/2023 Cycle 11 Revlimid /daratumumab -09/24/2023, Revlimid  starting 09/25/2023 Bone marrow biopsy at [facility] 09/29/2023-normocellular bone marrow with trilineage hematopoiesis and 1% blasts by manual aspirate differential.  No morphologic or overt immunophenotypic evidence of plasma cell neoplasm.  No monotypic plasma cells identified (MRD negative). Treatment held 10/22/2023 per patient request Appointment with Dr. Jeppie Moles 11/03/2023-recommends continuing daratumumab  monthly and lenalidomide  3 weeks out of 4 indefinitely if effective.  Zometa  4 mg every 3 months for a total of 2 years; restaging myeloma labs every 3 months. Cycle 12 Revlimid /daratumumab  11/05/2023, Revlimid  starting  11/06/2023 Cycle 13 Revlimid /daratumumab  12/03/2023 12/31/2023 treatment placed on hold due to worsening neuropathy symptoms and new diagnosis of heart failure   2.  Left shoulder pain-likely secondary to a lytic lesion at the left humeral head/neck 3.  Left chin/distal mandible numbness-likely secondary to myeloma compressing nerve roots at the brainstem or left face 4.  Hypertension 5.  Diabetes 6.  Sleep apnea 7.  Nephrolithiasis-right ureteral calculus urinary tract infection, placement of a right ureter stent right stone dislodgment 10/01/2022 8.  Asthma 9.  Report of rash 04/08/2023-location and appearance 04/23/2023 consistent with skin change related to the Velcade  injection. 10.  Lower extremity edema-venous Doppler negative for DVT in bilateral lower extremity 10/22/2023. 11. Systolic heart failure-EF 35 to 40% on echocardiogram 12/10/2023; cardiac MRI 12/27/2023 with EF 35%.  Disposition: Jeffery Higgins appears stable.  He is on active treatment with maintenance Revlimid /daratumumab .  We will follow-up on the myeloma panel from today.  He has progressive numbness in his feet and now hands.  He has been diagnosed with heart failure and is being followed in a heart failure clinic at [facility].  We decided to place treatment on hold due to the worsening neuropathy symptoms as well as the new diagnosis of heart failure.   He will receive Zometa  as scheduled.  He agrees with this plan.  He will return for follow-up in 4 weeks.  We will adjust follow-up accordingly pending input from cardiology.  Plan reviewed with Dr. Scherrie Curt.  Jeffery [facility]gins ANP/GNP-BC   12/31/2023  10:15 AM

## 2024-01-01 LAB — IGG: IgG (Immunoglobin G), Serum: 856 mg/dL (ref 603–1613)

## 2024-01-03 LAB — PROTEIN ELECTROPHORESIS, SERUM
A/G Ratio: 1.5 (ref 0.7–1.7)
Albumin ELP: 3.7 g/dL (ref 2.9–4.4)
Alpha-1-Globulin: 0.2 g/dL (ref 0.0–0.4)
Alpha-2-Globulin: 0.7 g/dL (ref 0.4–1.0)
Beta Globulin: 0.7 g/dL (ref 0.7–1.3)
Gamma Globulin: 0.8 g/dL (ref 0.4–1.8)
Globulin, Total: 2.4 g/dL (ref 2.2–3.9)
Total Protein ELP: 6.1 g/dL (ref 6.0–8.5)

## 2024-01-03 LAB — KAPPA/LAMBDA LIGHT CHAINS
Kappa free light chain: 15 mg/L (ref 3.3–19.4)
Kappa, lambda light chain ratio: 1.28 (ref 0.26–1.65)
Lambda free light chains: 11.7 mg/L (ref 5.7–26.3)

## 2024-01-06 ENCOUNTER — Inpatient Hospital Stay (HOSPITAL_BASED_OUTPATIENT_CLINIC_OR_DEPARTMENT_OTHER): Admitting: Nurse Practitioner

## 2024-01-06 ENCOUNTER — Encounter: Payer: Self-pay | Admitting: Nurse Practitioner

## 2024-01-06 VITALS — BP 146/82 | HR 99 | Temp 98.7°F | Resp 18 | Ht 70.0 in | Wt 259.3 lb

## 2024-01-06 DIAGNOSIS — C9000 Multiple myeloma not having achieved remission: Secondary | ICD-10-CM | POA: Diagnosis not present

## 2024-01-06 NOTE — Progress Notes (Signed)
 [facility] OFFICE PROGRESS NOTE   Diagnosis:  Multiple myeloma  INTERVAL HISTORY:   Jeffery Higgins returns as scheduled.  Revlimid  and daratumumab  placed on hold following last office visit due to recent diagnosis of heart failure and progressive neuropathy symptoms. He is undergoing cardiology evaluation.  There has been no significant change in the numbness in his hands and feet.  Stable neck and back pain.  Objective:  Vital signs in last 24 hours:  Blood pressure (!) 146/82, pulse 99, temperature 98.7 F (37.1 C), resp. rate 18, height 5\' 10"  (1.778 m), weight 259 lb 4.8 oz (117.6 kg), SpO2 98%.    Resp: Lungs clear bilaterally. Cardio: Regular rate and rhythm. GI: No hepatosplenomegaly. Vascular: No leg edema. Skin: No rash.   Lab Results:  Lab Results  Component Value Date   WBC 7.1 12/31/2023   HGB 15.8 12/31/2023   HCT 47.1 12/31/2023   MCV 90.6 12/31/2023   PLT 160 12/31/2023   NEUTROABS 3.7 12/31/2023    Imaging:  No results found.  Medications: I have reviewed the patient's current medications.  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 numerous enhancing lesions of the skull base and calvarium no evidence of extraosseous component 12/17/2022-MRI of the cervical, thoracic, and lumbar spine-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, clivus, bilateral occipital condyles, ribs, sacrum, and iliac bone, no evidence of pathologic fracture, expansile lesion in C3 extends into the spinal canal by 4 mm with moderate to severe spinal canal  stenosis posterior to C3, mild right foraminal narrowing at T2-T3 and T3-4 related to osseous tumor at T3 12/17/2022-SPEP-2.6 g serum M spike, IgG kappa Bone marrow biopsy 12/18/2022-multiple myeloma with plasma cells involving 60% of the cellular marrow, kappa restricted myeloma FISH panel-QNS Bone survey 12/18/2022-lucencies at the skull, right scapula, left clavicle, lumbar spine, pelvis, and left femoral neck.  Destructive lesion at C3, possible sclerotic lucent lesions in the thoracic spine 12/18/2022 pelvic radiation to C3 and humeral head-12/31/2022 Cycle 1 daratumumab -VRD 01/01/2023 (Velcade  3 weeks on/1 week off) Cycle 2 daratumumab -VRD 01/29/2023, Revlimid  discontinued after 19 days due to a pruritic rash Cycle 3 daratumumab -VRD 02/26/2023, Revlimid  dose reduced to 10 mg Cycle 4 daratumumab -VRD 03/26/2023, Revlimid  10 mg x 18 days, Dex changed to prednisone  Monday Wednesday Friday due to rash; Revlimid  placed on hold 04/08/2023 due to rash at the lower abdominal wall and pruritus Cycle 5 daratumumab /VRD 04/23/2023-patient declines to resume Revlimid  due to rash/pruritus; Velcade  placed on hold 04/23/2023 due to progressive neuropathy; proceeded with daratumumab  and dexamethasone  04/28/2023 appointment with Dr. Jeppie Moles [facility]-recommend continuing daratumumab  and dexamethasone .  Would attempt to resume lenalidomide  5 mg days 1 through 21 with addition of daily famotidine  and as needed topical steroids; escalate back to 10 mg if tolerated; dexamethasone  20 mg p.o. weekly.  Cycle length 28 days. Cycle 6 Revlimid /dexamethasone  plus daratumumab  05/07/2023 (Revlimid  5 mg days 1 through 21, 28-day cycle; dexamethasone  10 mg weekly (unable to tolerate higher dose of dexamethasone  due to emotional lability); Pepcid  20 mg daily). Zometa  monthly during induction therapy then every 3 months x 2 years Cycle 7 Revlimid /Decadron  plus daratumumab  06/04/2023 (Revlimid  5 mg days 1-21, 28-day cycle, Decadron  4 mg weekly (Decadron   further reduced  secondary to emotional lability) Cycle 8 Revlimid /Decadron  plus daratumumab  07/02/2023 Cycle 9 Revlimid /Decadron  plus daratumumab  07/30/2023 (he will discontinue home steroids) [facility] at [facility] 07/28/2023, IgG kappa protein, M spike too low to quantify Cycle 10 Revlimid /Decadron  plus daratumumab  08/27/2023 Cycle 11 Revlimid /daratumumab -09/24/2023, Revlimid  starting 09/25/2023 Bone marrow biopsy at [facility] 09/29/2023-normocellular bone marrow with trilineage hematopoiesis and 1% blasts by manual aspirate differential.  No morphologic or overt immunophenotypic evidence of plasma cell neoplasm.  No monotypic plasma cells identified (MRD negative). Treatment held 10/22/2023 per patient request Appointment with Dr. Jeppie Moles 11/03/2023-recommends continuing daratumumab  monthly and lenalidomide  3 weeks out of 4 indefinitely if effective.  Zometa  4 mg every 3 months for a total of 2 years; restaging myeloma labs every 3 months. Cycle 12 Revlimid /daratumumab  11/05/2023, Revlimid  starting 11/06/2023 Cycle 13 Revlimid /daratumumab  12/03/2023 12/31/2023 treatment placed on hold due to worsening neuropathy symptoms and new diagnosis of heart failure   2.  Left shoulder pain-likely secondary to a lytic lesion at the left humeral head/neck 3.  Left chin/distal mandible numbness-likely secondary to myeloma compressing nerve roots at the brainstem or left face 4.  Hypertension 5.  Diabetes 6.  Sleep apnea 7.  Nephrolithiasis-right ureteral calculus urinary tract infection, placement of a right ureter stent right stone dislodgment 10/01/2022 8.  Asthma 9.  Report of rash 04/08/2023-location and appearance 04/23/2023 consistent with skin change related to the Velcade  injection. 10.  Lower extremity edema-venous Doppler negative for DVT in bilateral lower extremity 10/22/2023. 11. Systolic heart failure-EF 35 to 40% on echocardiogram 12/10/2023; cardiac MRI 12/27/2023 with EF 35%.  Disposition: Jeffery Higgins appears  stable.  Revlimid  and daratumumab  were placed on hold following his last office visit due to a recent diagnosis of heart failure as well as progressive neuropathy symptoms.  He is undergoing cardiac evaluation.  His cardiologist is Dr. Wallene Gum at [facility].  We will request input from cardiology before resuming treatment.  He is scheduled to return for follow-up 01/28/2024.  Jeffery Higgins ANP/GNP-BC   01/06/2024  1:10 PM

## 2024-01-07 ENCOUNTER — Encounter: Payer: Self-pay | Admitting: Oncology

## 2024-01-21 DIAGNOSIS — I2510 Atherosclerotic heart disease of native coronary artery without angina pectoris: Secondary | ICD-10-CM | POA: Insufficient documentation

## 2024-01-23 ENCOUNTER — Other Ambulatory Visit: Payer: Self-pay | Admitting: Oncology

## 2024-01-24 ENCOUNTER — Other Ambulatory Visit: Payer: Self-pay | Admitting: Oncology

## 2024-01-28 ENCOUNTER — Inpatient Hospital Stay: Attending: Nurse Practitioner

## 2024-01-28 ENCOUNTER — Other Ambulatory Visit

## 2024-01-28 ENCOUNTER — Inpatient Hospital Stay (HOSPITAL_BASED_OUTPATIENT_CLINIC_OR_DEPARTMENT_OTHER): Attending: Nurse Practitioner | Admitting: Oncology

## 2024-01-28 ENCOUNTER — Encounter: Payer: Self-pay | Admitting: Oncology

## 2024-01-28 ENCOUNTER — Inpatient Hospital Stay

## 2024-01-28 VITALS — BP 133/88 | HR 80 | Temp 97.7°F | Resp 18 | Ht 70.0 in | Wt 256.8 lb

## 2024-01-28 DIAGNOSIS — E1140 Type 2 diabetes mellitus with diabetic neuropathy, unspecified: Secondary | ICD-10-CM | POA: Diagnosis not present

## 2024-01-28 DIAGNOSIS — M25512 Pain in left shoulder: Secondary | ICD-10-CM | POA: Insufficient documentation

## 2024-01-28 DIAGNOSIS — I110 Hypertensive heart disease with heart failure: Secondary | ICD-10-CM | POA: Diagnosis not present

## 2024-01-28 DIAGNOSIS — C9000 Multiple myeloma not having achieved remission: Secondary | ICD-10-CM | POA: Diagnosis not present

## 2024-01-28 DIAGNOSIS — N202 Calculus of kidney with calculus of ureter: Secondary | ICD-10-CM | POA: Diagnosis not present

## 2024-01-28 DIAGNOSIS — Z5112 Encounter for antineoplastic immunotherapy: Secondary | ICD-10-CM | POA: Insufficient documentation

## 2024-01-28 DIAGNOSIS — M545 Low back pain, unspecified: Secondary | ICD-10-CM | POA: Insufficient documentation

## 2024-01-28 DIAGNOSIS — Z87440 Personal history of urinary (tract) infections: Secondary | ICD-10-CM | POA: Diagnosis not present

## 2024-01-28 DIAGNOSIS — M7989 Other specified soft tissue disorders: Secondary | ICD-10-CM | POA: Diagnosis not present

## 2024-01-28 DIAGNOSIS — G4730 Sleep apnea, unspecified: Secondary | ICD-10-CM | POA: Insufficient documentation

## 2024-01-28 LAB — CBC WITH DIFFERENTIAL (CANCER CENTER ONLY)
Abs Immature Granulocytes: 0.01 10*3/uL (ref 0.00–0.07)
Basophils Absolute: 0.1 10*3/uL (ref 0.0–0.1)
Basophils Relative: 1 %
Eosinophils Absolute: 0.4 10*3/uL (ref 0.0–0.5)
Eosinophils Relative: 5 %
HCT: 49.4 % (ref 39.0–52.0)
Hemoglobin: 16.6 g/dL (ref 13.0–17.0)
Immature Granulocytes: 0 %
Lymphocytes Relative: 26 %
Lymphs Abs: 2 10*3/uL (ref 0.7–4.0)
MCH: 30.3 pg (ref 26.0–34.0)
MCHC: 33.6 g/dL (ref 30.0–36.0)
MCV: 90.1 fL (ref 80.0–100.0)
Monocytes Absolute: 0.6 10*3/uL (ref 0.1–1.0)
Monocytes Relative: 8 %
Neutro Abs: 4.6 10*3/uL (ref 1.7–7.7)
Neutrophils Relative %: 60 %
Platelet Count: 144 10*3/uL — ABNORMAL LOW (ref 150–400)
RBC: 5.48 MIL/uL (ref 4.22–5.81)
RDW: 14 % (ref 11.5–15.5)
WBC Count: 7.7 10*3/uL (ref 4.0–10.5)
nRBC: 0 % (ref 0.0–0.2)

## 2024-01-28 LAB — CMP (CANCER CENTER ONLY)
ALT: 29 U/L (ref 0–44)
AST: 22 U/L (ref 15–41)
Albumin: 4.4 g/dL (ref 3.5–5.0)
Alkaline Phosphatase: 63 U/L (ref 38–126)
Anion gap: 11 (ref 5–15)
BUN: 21 mg/dL (ref 8–23)
CO2: 27 mmol/L (ref 22–32)
Calcium: 10.2 mg/dL (ref 8.9–10.3)
Chloride: 100 mmol/L (ref 98–111)
Creatinine: 1.14 mg/dL (ref 0.61–1.24)
GFR, Estimated: >60 mL/min (ref >60)
Glucose, Bld: 167 mg/dL — ABNORMAL HIGH (ref 70–99)
Potassium: 4.7 mmol/L (ref 3.5–5.1)
Sodium: 138 mmol/L (ref 135–145)
Total Bilirubin: 0.4 mg/dL (ref 0.0–1.2)
Total Protein: 7 g/dL (ref 6.5–8.1)

## 2024-01-28 NOTE — Progress Notes (Signed)
 [facility] OFFICE PROGRESS NOTE   Diagnosis: Multiple myeloma  INTERVAL HISTORY:   Jeffery Higgins returns as scheduled.  He reports improvement in leg swelling.  He continues to have tingling in the extremities.  He has low back pain.   Objective:  Vital signs in last 24 hours:  Blood pressure 133/88, pulse 80, temperature 97.7 F (36.5 C), temperature source Temporal, resp. rate 18, height 5' 10 (1.778 m), weight 256 lb 12.8 oz (116.5 kg), SpO2 98%.   Resp: Lungs clear bilaterally Cardio: Regular rate and rhythm GI: No hepatosplenomegaly Vascular: No leg edema  Lab Results:  Lab Results  Component Value Date   WBC 7.7 01/28/2024   HGB 16.6 01/28/2024   HCT 49.4 01/28/2024   MCV 90.1 01/28/2024   PLT 144 (L) 01/28/2024   NEUTROABS 4.6 01/28/2024    CMP  Lab Results  Component Value Date   NA 138 01/28/2024   K 4.7 01/28/2024   CL 100 01/28/2024   CO2 27 01/28/2024   GLUCOSE 167 (H) 01/28/2024   BUN 21 01/28/2024   CREATININE 1.14 01/28/2024   CALCIUM 10.2 01/28/2024   PROT 7.0 01/28/2024   ALBUMIN 4.4 01/28/2024   AST 22 01/28/2024   ALT 29 01/28/2024   ALKPHOS 63 01/28/2024   BILITOT 0.4 01/28/2024   GFRNONAA >60 01/28/2024   GFRAA >90 08/02/2012     Medications: I have reviewed the patient's current medications.   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 numerous enhancing lesions of the skull base and calvarium no evidence of extraosseous component 12/17/2022-MRI of the cervical, thoracic, and lumbar spine-diffuse osseous metastatic  disease throughout the spine, clivus, bilateral occipital condyles, ribs, sacrum, and iliac bone, no evidence of pathologic fracture, expansile lesion in C3 extends into the spinal canal by 4 mm with moderate to severe spinal canal stenosis posterior to C3, mild right foraminal narrowing at T2-T3 and T3-4 related to osseous tumor at T3 12/17/2022-SPEP-2.6 g serum M spike, IgG kappa Bone marrow biopsy 12/18/2022-multiple myeloma with plasma cells involving 60% of the cellular marrow, kappa restricted myeloma FISH panel-QNS Bone survey 12/18/2022-lucencies at the skull, right scapula, left clavicle, lumbar spine, pelvis, and left femoral neck.  Destructive lesion at C3, possible sclerotic lucent lesions in the thoracic spine 12/18/2022 pelvic radiation to C3 and humeral head-12/31/2022 Cycle 1 daratumumab -VRD 01/01/2023 (Velcade  3 weeks on/1 week off) Cycle 2 daratumumab -VRD 01/29/2023, Revlimid  discontinued after 19 days due to a pruritic rash Cycle 3 daratumumab -VRD 02/26/2023, Revlimid  dose reduced to 10 mg Cycle 4 daratumumab -VRD 03/26/2023, Revlimid  10 mg x 18 days, Dex changed to prednisone  Monday Wednesday Friday due to rash; Revlimid  placed on hold 04/08/2023 due to rash at the lower abdominal wall and pruritus Cycle 5 daratumumab /VRD 04/23/2023-patient declines to resume Revlimid  due to rash/pruritus; Velcade  placed on hold 04/23/2023 due to progressive neuropathy; proceeded with daratumumab  and dexamethasone  04/28/2023 appointment with Dr. Jeppie Moles [facility]-recommend continuing daratumumab  and dexamethasone .  Would attempt to resume lenalidomide  5 mg days 1 through 21 with addition of daily famotidine  and as needed topical steroids; escalate back to 10 mg if tolerated; dexamethasone  20 mg p.o. weekly.  Cycle length 28 days. Cycle 6 Revlimid /dexamethasone  plus daratumumab  05/07/2023 (Revlimid   5 mg days 1 through 21, 28-day cycle; dexamethasone  10 mg weekly (unable to tolerate higher dose of dexamethasone  due to emotional  lability); Pepcid  20 mg daily). Zometa  monthly during induction therapy then every 3 months x 2 years Cycle 7 Revlimid /Decadron  plus daratumumab  06/04/2023 (Revlimid  5 mg days 1-21, 28-day cycle, Decadron  4 mg weekly (Decadron  further reduced secondary to emotional lability) Cycle 8 Revlimid /Decadron  plus daratumumab  07/02/2023 Cycle 9 Revlimid /Decadron  plus daratumumab  07/30/2023 (he will discontinue home steroids) Myeloma panel at [facility] 07/28/2023, IgG kappa protein, M spike too low to quantify Cycle 10 Revlimid /Decadron  plus daratumumab  08/27/2023 Cycle 11 Revlimid /daratumumab -09/24/2023, Revlimid  starting 09/25/2023 Bone marrow biopsy at [facility] 09/29/2023-normocellular bone marrow with trilineage hematopoiesis and 1% blasts by manual aspirate differential.  No morphologic or overt immunophenotypic evidence of plasma cell neoplasm.  No monotypic plasma cells identified (MRD negative). Treatment held 10/22/2023 per patient request Appointment with Dr. Jeppie Moles 11/03/2023-recommends continuing daratumumab  monthly and lenalidomide  3 weeks out of 4 indefinitely if effective.  Zometa  4 mg every 3 months for a total of 2 years; restaging myeloma labs every 3 months. Cycle 12 Revlimid /daratumumab  11/05/2023, Revlimid  starting 11/06/2023 Cycle 13 Revlimid /daratumumab  12/03/2023 12/31/2023 treatment placed on hold due to worsening neuropathy symptoms and new diagnosis of heart failure   2.  Left shoulder pain-likely secondary to a lytic lesion at the left humeral head/neck 3.  Left chin/distal mandible numbness-likely secondary to myeloma compressing nerve roots at the brainstem or left face 4.  Hypertension 5.  Diabetes 6.  Sleep apnea 7.  Nephrolithiasis-right ureteral calculus urinary tract infection, placement of a right ureter stent right stone dislodgment 10/01/2022 8.  Asthma 9.  Report of rash 04/08/2023-location and appearance 04/23/2023 consistent with skin change related to the Velcade  injection. 10.  Lower  extremity edema-venous Doppler negative for DVT in bilateral lower extremity 10/22/2023. 11. Systolic heart failure-EF 35 to 40% on echocardiogram 12/10/2023; cardiac MRI 12/27/2023 with EF 35%. 12.  Back pain:  MRI cervical spine 12/15/2023-resolution of widespread marrow enhancing lesion, no definite residual tumor MRI lumbar spine 12/15/2023-resolution of previously seen T2 bright and enhancing marrow space lesions, 7 mm indeterminate focus in S2    Disposition: Jeffery Higgins in clinical remission from multiple myeloma.  He was diagnosed with heart failure in April.  He understands the uncommon association of cardiac toxicity with current myeloma regimen.  I discussed the case with Dr.Yourshaw.  He does not feel the heart failure is associated with Revlimid /daratumumab .  He is comfortable continuing treatment with close follow-up of heart function.  I will contact Dr. Jeppie Moles at his opinion prior to resuming daratumumab .  Daratumumab  will be held today.  He will be scheduled for daratumumab  in 1 week and an office visit in 1 month.  Coni Deep, MD  01/28/2024  9:12 AM

## 2024-01-31 ENCOUNTER — Other Ambulatory Visit: Payer: Self-pay | Admitting: *Deleted

## 2024-01-31 DIAGNOSIS — C9000 Multiple myeloma not having achieved remission: Secondary | ICD-10-CM

## 2024-01-31 NOTE — Telephone Encounter (Signed)
 Still on hold 01/31/24

## 2024-02-01 ENCOUNTER — Other Ambulatory Visit: Payer: Self-pay | Admitting: *Deleted

## 2024-02-01 DIAGNOSIS — C9000 Multiple myeloma not having achieved remission: Secondary | ICD-10-CM

## 2024-02-01 NOTE — Progress Notes (Signed)
 Called lab to add myeloma panel to 6/13 collection

## 2024-02-03 ENCOUNTER — Other Ambulatory Visit

## 2024-02-03 ENCOUNTER — Ambulatory Visit: Admitting: Oncology

## 2024-02-03 ENCOUNTER — Ambulatory Visit

## 2024-02-03 DIAGNOSIS — C9000 Multiple myeloma not having achieved remission: Secondary | ICD-10-CM

## 2024-02-04 ENCOUNTER — Other Ambulatory Visit: Payer: Self-pay | Admitting: Oncology

## 2024-02-04 ENCOUNTER — Encounter: Payer: Self-pay | Admitting: Oncology

## 2024-02-04 ENCOUNTER — Telehealth: Payer: Self-pay | Admitting: *Deleted

## 2024-02-04 LAB — MULTIPLE MYELOMA PANEL, SERUM
Albumin SerPl Elph-Mcnc: 3.9 g/dL (ref 2.9–4.4)
Albumin/Glob SerPl: 1.7 (ref 0.7–1.7)
Alpha 1: 0.2 g/dL (ref 0.0–0.4)
Alpha2 Glob SerPl Elph-Mcnc: 0.8 g/dL (ref 0.4–1.0)
B-Globulin SerPl Elph-Mcnc: 0.7 g/dL (ref 0.7–1.3)
Gamma Glob SerPl Elph-Mcnc: 0.8 g/dL (ref 0.4–1.8)
Globulin, Total: 2.4 g/dL (ref 2.2–3.9)
IgA: 58 mg/dL — ABNORMAL LOW (ref 61–437)
IgG (Immunoglobin G), Serum: 846 mg/dL (ref 603–1613)
IgM (Immunoglobulin M), Srm: 29 mg/dL (ref 20–172)
Total Protein ELP: 6.3 g/dL (ref 6.0–8.5)

## 2024-02-04 NOTE — Telephone Encounter (Signed)
 Dr. Scherrie Curt was able to speak with Dr. Artist Binet (CD) and Dr. Arnita Bible (Onc) and was given OK to resume Darzalex  therapy. Recommend to hold off on resuming Revlimid  for now to see how he does with single agent Darzalex .  Jeffery Higgins wishes to resume therapy on Tuesday instead of today. He will be seen with cycle in July.

## 2024-02-04 NOTE — Telephone Encounter (Signed)
 Will remain on hold until July at least

## 2024-02-08 ENCOUNTER — Inpatient Hospital Stay

## 2024-02-08 ENCOUNTER — Encounter: Payer: Self-pay | Admitting: Oncology

## 2024-02-08 VITALS — BP 126/76 | HR 79 | Temp 98.2°F | Resp 18

## 2024-02-08 DIAGNOSIS — C9000 Multiple myeloma not having achieved remission: Secondary | ICD-10-CM | POA: Diagnosis not present

## 2024-02-08 LAB — CMP (CANCER CENTER ONLY)
ALT: 27 U/L (ref 0–44)
AST: 23 U/L (ref 15–41)
Albumin: 4.5 g/dL (ref 3.5–5.0)
Alkaline Phosphatase: 60 U/L (ref 38–126)
Anion gap: 11 (ref 5–15)
BUN: 22 mg/dL (ref 8–23)
CO2: 25 mmol/L (ref 22–32)
Calcium: 9.7 mg/dL (ref 8.9–10.3)
Chloride: 104 mmol/L (ref 98–111)
Creatinine: 1.31 mg/dL — ABNORMAL HIGH (ref 0.61–1.24)
GFR, Estimated: 60 mL/min — ABNORMAL LOW (ref >60)
Glucose, Bld: 112 mg/dL — ABNORMAL HIGH (ref 70–99)
Potassium: 4.5 mmol/L (ref 3.5–5.1)
Sodium: 140 mmol/L (ref 135–145)
Total Bilirubin: 0.4 mg/dL (ref 0.0–1.2)
Total Protein: 6.9 g/dL (ref 6.5–8.1)

## 2024-02-08 LAB — CBC WITH DIFFERENTIAL (CANCER CENTER ONLY)
Abs Immature Granulocytes: 0.03 10*3/uL (ref 0.00–0.07)
Basophils Absolute: 0.1 10*3/uL (ref 0.0–0.1)
Basophils Relative: 1 %
Eosinophils Absolute: 0.3 10*3/uL (ref 0.0–0.5)
Eosinophils Relative: 4 %
HCT: 45.9 % (ref 39.0–52.0)
Hemoglobin: 15.4 g/dL (ref 13.0–17.0)
Immature Granulocytes: 0 %
Lymphocytes Relative: 24 %
Lymphs Abs: 2 10*3/uL (ref 0.7–4.0)
MCH: 30.2 pg (ref 26.0–34.0)
MCHC: 33.6 g/dL (ref 30.0–36.0)
MCV: 90 fL (ref 80.0–100.0)
Monocytes Absolute: 0.7 10*3/uL (ref 0.1–1.0)
Monocytes Relative: 8 %
Neutro Abs: 5.3 10*3/uL (ref 1.7–7.7)
Neutrophils Relative %: 63 %
Platelet Count: 154 10*3/uL (ref 150–400)
RBC: 5.1 MIL/uL (ref 4.22–5.81)
RDW: 14 % (ref 11.5–15.5)
WBC Count: 8.3 10*3/uL (ref 4.0–10.5)
nRBC: 0 % (ref 0.0–0.2)

## 2024-02-08 MED ORDER — DIPHENHYDRAMINE HCL 25 MG PO CAPS
25.0000 mg | ORAL_CAPSULE | Freq: Once | ORAL | Status: AC
  Administered 2024-02-08: 25 mg via ORAL
  Filled 2024-02-08: qty 1

## 2024-02-08 MED ORDER — DEXAMETHASONE 4 MG PO TABS
4.0000 mg | ORAL_TABLET | Freq: Once | ORAL | Status: AC
  Administered 2024-02-08: 4 mg via ORAL
  Filled 2024-02-08: qty 1

## 2024-02-08 MED ORDER — ACETAMINOPHEN 325 MG PO TABS
650.0000 mg | ORAL_TABLET | Freq: Once | ORAL | Status: AC
  Administered 2024-02-08: 650 mg via ORAL
  Filled 2024-02-08: qty 2

## 2024-02-08 MED ORDER — DARATUMUMAB-HYALURONIDASE-FIHJ 1800-30000 MG-UT/15ML ~~LOC~~ SOLN
1800.0000 mg | Freq: Once | SUBCUTANEOUS | Status: AC
  Administered 2024-02-08: 1800 mg via SUBCUTANEOUS
  Filled 2024-02-08: qty 15
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
## 2024-03-03 ENCOUNTER — Ambulatory Visit

## 2024-03-03 ENCOUNTER — Other Ambulatory Visit

## 2024-03-03 ENCOUNTER — Ambulatory Visit: Admitting: Oncology

## 2024-03-04 ENCOUNTER — Other Ambulatory Visit: Payer: Self-pay | Admitting: Oncology

## 2024-03-06 ENCOUNTER — Other Ambulatory Visit: Payer: Self-pay | Admitting: Nurse Practitioner

## 2024-03-06 DIAGNOSIS — C9000 Multiple myeloma not having achieved remission: Secondary | ICD-10-CM

## 2024-03-07 ENCOUNTER — Inpatient Hospital Stay: Attending: Nurse Practitioner

## 2024-03-07 ENCOUNTER — Inpatient Hospital Stay (HOSPITAL_BASED_OUTPATIENT_CLINIC_OR_DEPARTMENT_OTHER): Admitting: Nurse Practitioner

## 2024-03-07 ENCOUNTER — Encounter: Payer: Self-pay | Admitting: Nurse Practitioner

## 2024-03-07 ENCOUNTER — Inpatient Hospital Stay

## 2024-03-07 VITALS — BP 131/74 | HR 69 | Temp 97.1°F | Resp 18 | Wt 253.2 lb

## 2024-03-07 VITALS — BP 142/79 | HR 65 | Resp 18

## 2024-03-07 DIAGNOSIS — E1140 Type 2 diabetes mellitus with diabetic neuropathy, unspecified: Secondary | ICD-10-CM | POA: Diagnosis not present

## 2024-03-07 DIAGNOSIS — M545 Low back pain, unspecified: Secondary | ICD-10-CM | POA: Insufficient documentation

## 2024-03-07 DIAGNOSIS — Z5112 Encounter for antineoplastic immunotherapy: Secondary | ICD-10-CM | POA: Diagnosis not present

## 2024-03-07 DIAGNOSIS — I110 Hypertensive heart disease with heart failure: Secondary | ICD-10-CM | POA: Insufficient documentation

## 2024-03-07 DIAGNOSIS — Z87440 Personal history of urinary (tract) infections: Secondary | ICD-10-CM | POA: Diagnosis not present

## 2024-03-07 DIAGNOSIS — Z7984 Long term (current) use of oral hypoglycemic drugs: Secondary | ICD-10-CM | POA: Insufficient documentation

## 2024-03-07 DIAGNOSIS — I10 Essential (primary) hypertension: Secondary | ICD-10-CM | POA: Diagnosis not present

## 2024-03-07 DIAGNOSIS — R42 Dizziness and giddiness: Secondary | ICD-10-CM | POA: Insufficient documentation

## 2024-03-07 DIAGNOSIS — Z87442 Personal history of urinary calculi: Secondary | ICD-10-CM | POA: Insufficient documentation

## 2024-03-07 DIAGNOSIS — M7989 Other specified soft tissue disorders: Secondary | ICD-10-CM | POA: Diagnosis not present

## 2024-03-07 DIAGNOSIS — C9000 Multiple myeloma not having achieved remission: Secondary | ICD-10-CM | POA: Insufficient documentation

## 2024-03-07 DIAGNOSIS — G4730 Sleep apnea, unspecified: Secondary | ICD-10-CM | POA: Insufficient documentation

## 2024-03-07 DIAGNOSIS — M129 Arthropathy, unspecified: Secondary | ICD-10-CM | POA: Diagnosis not present

## 2024-03-07 DIAGNOSIS — N200 Calculus of kidney: Secondary | ICD-10-CM | POA: Insufficient documentation

## 2024-03-07 DIAGNOSIS — J45909 Unspecified asthma, uncomplicated: Secondary | ICD-10-CM | POA: Diagnosis not present

## 2024-03-07 LAB — CMP (CANCER CENTER ONLY)
ALT: 22 U/L (ref 0–44)
AST: 22 U/L (ref 15–41)
Albumin: 4.4 g/dL (ref 3.5–5.0)
Alkaline Phosphatase: 56 U/L (ref 38–126)
Anion gap: 12 (ref 5–15)
BUN: 21 mg/dL (ref 8–23)
CO2: 24 mmol/L (ref 22–32)
Calcium: 9.7 mg/dL (ref 8.9–10.3)
Chloride: 102 mmol/L (ref 98–111)
Creatinine: 1.08 mg/dL (ref 0.61–1.24)
GFR, Estimated: >60 mL/min (ref >60)
Glucose, Bld: 154 mg/dL — ABNORMAL HIGH (ref 70–99)
Potassium: 4.7 mmol/L (ref 3.5–5.1)
Sodium: 138 mmol/L (ref 135–145)
Total Bilirubin: 0.5 mg/dL (ref 0.0–1.2)
Total Protein: 6.8 g/dL (ref 6.5–8.1)

## 2024-03-07 LAB — CBC WITH DIFFERENTIAL (CANCER CENTER ONLY)
Abs Immature Granulocytes: 0.02 K/uL (ref 0.00–0.07)
Basophils Absolute: 0 K/uL (ref 0.0–0.1)
Basophils Relative: 1 %
Eosinophils Absolute: 0.3 K/uL (ref 0.0–0.5)
Eosinophils Relative: 4 %
HCT: 45.6 % (ref 39.0–52.0)
Hemoglobin: 15.2 g/dL (ref 13.0–17.0)
Immature Granulocytes: 0 %
Lymphocytes Relative: 22 %
Lymphs Abs: 1.4 K/uL (ref 0.7–4.0)
MCH: 30.3 pg (ref 26.0–34.0)
MCHC: 33.3 g/dL (ref 30.0–36.0)
MCV: 90.8 fL (ref 80.0–100.0)
Monocytes Absolute: 0.7 K/uL (ref 0.1–1.0)
Monocytes Relative: 12 %
Neutro Abs: 3.8 K/uL (ref 1.7–7.7)
Neutrophils Relative %: 61 %
Platelet Count: 144 K/uL — ABNORMAL LOW (ref 150–400)
RBC: 5.02 MIL/uL (ref 4.22–5.81)
RDW: 14.7 % (ref 11.5–15.5)
WBC Count: 6.2 K/uL (ref 4.0–10.5)
nRBC: 0 % (ref 0.0–0.2)

## 2024-03-07 MED ORDER — DARATUMUMAB-HYALURONIDASE-FIHJ 1800-30000 MG-UT/15ML ~~LOC~~ SOLN
1800.0000 mg | Freq: Once | SUBCUTANEOUS | Status: AC
  Administered 2024-03-07: 1800 mg via SUBCUTANEOUS
  Filled 2024-03-07: qty 15

## 2024-03-07 MED ORDER — ACETAMINOPHEN 325 MG PO TABS
650.0000 mg | ORAL_TABLET | Freq: Once | ORAL | Status: AC
  Administered 2024-03-07: 650 mg via ORAL
  Filled 2024-03-07: qty 2

## 2024-03-07 MED ORDER — DIPHENHYDRAMINE HCL 25 MG PO CAPS
25.0000 mg | ORAL_CAPSULE | Freq: Once | ORAL | Status: AC
  Administered 2024-03-07: 25 mg via ORAL
  Filled 2024-03-07: qty 1

## 2024-03-07 MED ORDER — DEXAMETHASONE 4 MG PO TABS
4.0000 mg | ORAL_TABLET | Freq: Once | ORAL | Status: AC
  Administered 2024-03-07: 4 mg via ORAL
  Filled 2024-03-07: qty 1

## 2024-03-07 NOTE — Progress Notes (Signed)
 Patient seen by Lacie Burton, NP today  Vitals are within treatment parameters:Yes  Decreased 3 pounds Labs are within treatment parameters: Yes   Treatment plan has been signed: Yes   Per physician team, Patient is ready for treatment and there are NO modifications to the treatment plan.

## 2024-03-07 NOTE — Patient Instructions (Signed)
 [facility]   Discharge Instructions: Thank you for choosing [facility] to provide your oncology and hematology care.   If you have a lab appointment with the Cancer Center, please go directly to the Cancer Center and check in at the registration area.   Wear comfortable clothing and clothing appropriate for easy access to any Portacath or PICC line.   We strive to give you quality time with your provider. You may need to reschedule your appointment if you arrive late (15 or more minutes).  Arriving late affects you and other patients whose appointments are after yours.  Also, if you miss three or more appointments without notifying the office, you may be dismissed from the clinic at the provider's discretion.      For prescription refill requests, have your pharmacy contact our office and allow 72 hours for refills to be completed.    Today you received the following chemotherapy and/or immunotherapy agents DARZALEX-FASPRO      To help prevent nausea and vomiting after your treatment, we encourage you to take your nausea medication as directed.  BELOW ARE SYMPTOMS THAT SHOULD BE REPORTED IMMEDIATELY: *FEVER GREATER THAN 100.4 F (38 C) OR HIGHER *CHILLS OR SWEATING *NAUSEA AND VOMITING THAT IS NOT CONTROLLED WITH YOUR NAUSEA MEDICATION *UNUSUAL SHORTNESS OF BREATH *UNUSUAL BRUISING OR BLEEDING *URINARY PROBLEMS (pain or burning when urinating, or frequent urination) *BOWEL PROBLEMS (unusual diarrhea, constipation, pain near the anus) TENDERNESS IN MOUTH AND THROAT WITH OR WITHOUT PRESENCE OF ULCERS (sore throat, sores in mouth, or a toothache) UNUSUAL RASH, SWELLING OR PAIN  UNUSUAL VAGINAL DISCHARGE OR ITCHING   Items with * indicate a potential emergency and should be followed up as soon as possible or go to the Emergency Department if any problems should occur.  Please show the CHEMOTHERAPY ALERT CARD or  IMMUNOTHERAPY ALERT CARD at check-in to the Emergency Department and triage nurse.  Should you have questions after your visit or need to cancel or reschedule your appointment, please contact [facility]  Dept: [phone]  and follow the prompts.  Office hours are 8:00 a.m. to 4:30 p.m. Monday - Friday. Please note that voicemails left after 4:00 p.m. may not be returned until the following business day.  We are closed weekends and major holidays. You have access to a nurse at all times for urgent questions. Please call the main number to the clinic Dept: [phone] and follow the prompts.   For any non-urgent questions, you may also contact your provider using MyChart. We now offer e-Visits for anyone 79 and older to request care online for non-urgent symptoms. For details visit [email or web address].   Also download the MyChart app! Go to the app store, search "MyChart", open the app, select [facility], and log in with your MyChart username and password.  Daratumumab; Hyaluronidase Injection What is this medication? DARATUMUMAB; HYALURONIDASE (dar a toom ue mab; hye al ur ON i dase) treats multiple myeloma, a type of bone marrow cancer. Daratumumab works by blocking a protein that causes cancer cells to grow and multiply. This helps to slow or stop the spread of cancer cells. Hyaluronidase works by increasing the absorption of other medications in the body to help them work better. This medication may also be used treat amyloidosis, a condition that causes the buildup of a protein (amyloid) in your body. It works by  reducing the buildup of this protein, which decreases symptoms. It is a combination medication that contains a monoclonal antibody. This medicine may be used for other purposes; ask your health care provider or pharmacist if you have questions. COMMON BRAND NAME(S): DARZALEX FASPRO What should I tell my care team before I take  this medication? They need to know if you have any of these conditions: Heart disease Infection, such as chickenpox, cold sores, herpes, hepatitis B Lung or breathing disease An unusual or allergic reaction to daratumumab, hyaluronidase, other medications, foods, dyes, or preservatives Pregnant or trying to get pregnant Breast-feeding How should I use this medication? This medication is injected under the skin. It is given by your care team in a hospital or clinic setting. Talk to your care team about the use of this medication in children. Special care may be needed. Overdosage: If you think you have taken too much of this medicine contact a poison control center or emergency room at once. NOTE: This medicine is only for you. Do not share this medicine with others. What if I miss a dose? Keep appointments for follow-up doses. It is important not to miss your dose. Call your care team if you are unable to keep an appointment. What may interact with this medication? Interactions have not been studied. This list may not describe all possible interactions. Give your health care provider a list of all the medicines, herbs, non-prescription drugs, or dietary supplements you use. Also tell them if you smoke, drink alcohol, or use illegal drugs. Some items may interact with your medicine. What should I watch for while using this medication? Your condition will be monitored carefully while you are receiving this medication. This medication can cause serious allergic reactions. To reduce your risk, your care team may give you other medication to take before receiving this one. Be sure to follow the directions from your care team. This medication can affect the results of blood tests to match your blood type. These changes can last for up to 6 months after the final dose. Your care team will do blood tests to match your blood type before you start treatment. Tell all of your care team that you are being  treated with this medication before receiving a blood transfusion. This medication can affect the results of some tests used to determine treatment response; extra tests may be needed to evaluate response. Talk to your care team if you wish to become pregnant or think you are pregnant. This medication can cause serious birth defects if taken during pregnancy and for 3 months after the last dose. A reliable form of contraception is recommended while taking this medication and for 3 months after the last dose. Talk to your care team about effective forms of contraception. Do not breast-feed while taking this medication. What side effects may I notice from receiving this medication? Side effects that you should report to your care team as soon as possible: Allergic reactions--skin rash, itching, hives, swelling of the face, lips, tongue, or throat Heart rhythm changes--fast or irregular heartbeat, dizziness, feeling faint or lightheaded, chest pain, trouble breathing Infection--fever, chills, cough, sore throat, wounds that don't heal, pain or trouble when passing urine, general feeling of discomfort or being unwell Infusion reactions--chest pain, shortness of breath or trouble breathing, feeling faint or lightheaded Sudden eye pain or change in vision such as blurry vision, seeing halos around lights, vision loss Unusual bruising or bleeding Side effects that usually do not require medical attention (  report to your care team if they continue or are bothersome): Constipation Diarrhea Fatigue Nausea Pain, tingling, or numbness in the hands or feet Swelling of the ankles, hands, or feet This list may not describe all possible side effects. Call your doctor for medical advice about side effects. You may report side effects to FDA at 1-800-FDA-1088. Where should I keep my medication? This medication is given in a hospital or clinic. It will not be stored at home. NOTE: This sheet is a summary. It may  not cover all possible information. If you have questions about this medicine, talk to your doctor, pharmacist, or health care provider.  2024 Elsevier/Gold Standard (2021-12-09 00:00:00)

## 2024-03-07 NOTE — Progress Notes (Addendum)
 [facility]   [phone] Fax:([phone]) [phone]    Patient Care Team: Pcp, No as PCP - General   CHIEF COMPLAINT: Follow up multiple myeloma   CURRENT THERAPY: Maintenance daratumumab -lenalidomide    INTERVAL HISTORY Jeffery Higgins returns for follow up as scheduled. Last seen 01/28/24 and resumed dara 02/08/24. He resumed Revlimid  2 weeks ago. While off therapy neuropathy improved but has worsened again since he restarted Revlimid . Tingling is in hands and feet. He can function well without difficulty, no fall, etc but he is concerned about it getting worse. Also having some lightheadedness/tinnitus with cardiac med adjustments. Leg swelling has improved. He will be seeing neuro for low back pain soon.   ROS  All other systems reviewed and negative  Past Medical History:  Diagnosis Date   Arthritis    Asthma    BMI 40.0-44.9, adult (HCC) 08/03/2012   Diabetes mellitus type 2 in obese 08/03/2012   Diabetes mellitus without complication (HCC)    History of kidney stones 08/18/2003   Hypertension    Hypertension 08/03/2012   Kidney stone    Sleep apnea    not using CPAP - could not tolerate     Past Surgical History:  Procedure Laterality Date   CHOLECYSTECTOMY  08/02/2012   Procedure: LAPAROSCOPIC CHOLECYSTECTOMY WITH INTRAOPERATIVE CHOLANGIOGRAM;  Surgeon: Morene ONEIDA Olives, MD;  Location: [facility] OR;  Service: General;  Laterality: N/A;  laparoscopic cholecystectomy with intraoperative cholangiogram   KNEE ARTHROSCOPY     LITHOTRIPSY     SHOULDER ARTHROSCOPY WITH SUBACROMIAL DECOMPRESSION  07/07/2012   Procedure: SHOULDER ARTHROSCOPY WITH SUBACROMIAL DECOMPRESSION;  Surgeon: Franky CHRISTELLA Pointer, MD;  Location: [facility] OR;  Service: Orthopedics;  Laterality: Right;  with distal clavicle resection     Outpatient Encounter Medications as of 03/07/2024  Medication Sig Note   acetaminophen  (TYLENOL ) 325 MG tablet Take 2 tablets (650 mg total) by mouth every 6 (six)  hours as needed (or Temp > 100).    acyclovir  (ZOVIRAX ) 400 MG tablet TAKE 1 TABLET(400 MG) BY MOUTH TWICE DAILY    albuterol  (PROVENTIL ) (2.5 MG/3ML) 0.083% nebulizer solution Inhale 2.5 mg into the lungs every 6 (six) hours as needed for shortness of breath.    ALPRAZolam  (XANAX ) 0.5 MG tablet Take 1 tablet (0.5 mg total) by mouth 2 (two) times daily as needed for anxiety. Do not drive while taking    amLODipine -atorvastatin (CADUET) 5-40 MG tablet Take 1 tablet by mouth daily.    aspirin EC 81 MG tablet Take 81 mg by mouth daily. Swallow whole.    cyclobenzaprine  (FLEXERIL ) 10 MG tablet Take 10 mg by mouth 3 (three) times daily as needed for muscle spasms.    diphenhydrAMINE  (BENADRYL ) 25 mg capsule Take 25-50 mg by mouth every 6 (six) hours as needed for itching.    empagliflozin (JARDIANCE) 10 MG TABS tablet Take 1 tablet by mouth daily.    ENTRESTO 24-26 MG Take 1 tablet by mouth 2 (two) times daily.    furosemide (LASIX) 20 MG tablet Take 1 tablet by mouth daily.    furosemide (LASIX) 20 MG tablet Take 20 mg by mouth daily.    glipiZIDE (GLUCOTROL XL) 10 MG 24 hr tablet Take 10 mg by mouth daily.    JANUVIA 100 MG tablet Take 100 mg by mouth daily.    lenalidomide  (REVLIMID ) 5 MG capsule Take 1 capsule (5 mg total) by mouth daily. Take x 21 days on, then 7 days off. Repeat  every 28 days. Start cycle on 12/31/23    losartan  (COZAAR ) 100 MG tablet Take 100 mg by mouth daily.    oxyCODONE  (ROXICODONE ) 5 MG immediate release tablet Take 1 tablet (5 mg total) by mouth every 4 (four) hours as needed for severe pain.    sildenafil (REVATIO) 20 MG tablet Take by mouth as needed.    spironolactone (ALDACTONE) 25 MG tablet Take 1 tablet by mouth daily.    albuterol  (VENTOLIN  HFA) 108 (90 Base) MCG/ACT inhaler Inhale 1 puff into the lungs every 6 (six) hours as needed for shortness of breath.    amLODipine  (NORVASC ) 5 MG tablet Take 5 mg by mouth daily. (Patient not taking: Reported on 03/07/2024)     DULoxetine (CYMBALTA) 30 MG capsule Take 1 capsule by mouth daily. (Patient not taking: Reported on 03/07/2024)    DULoxetine (CYMBALTA) 60 MG capsule Take 60 mg by mouth 2 (two) times daily. (Patient not taking: Reported on 03/07/2024)    famotidine  (PEPCID ) 20 MG tablet Take 1 tablet (20 mg total) by mouth daily. (Patient not taking: Reported on 03/07/2024)    hydrocortisone  2.5 % cream Apply topically 2 (two) times daily. (Patient not taking: Reported on 03/07/2024)    hydrOXYzine  (ATARAX ) 10 MG tablet TAKE 1 TABLET(10 MG) BY MOUTH THREE TIMES DAILY AS NEEDED (Patient not taking: Reported on 03/07/2024)    pantoprazole  (PROTONIX ) 20 MG tablet Take 1 tablet (20 mg total) by mouth daily. (Patient not taking: Reported on 03/07/2024) 02/26/2023: Takes prn   Plecanatide (TRULANCE) 3 MG TABS Take 3 mg by mouth daily as needed. (Patient not taking: Reported on 03/07/2024)    polyethylene glycol (MIRALAX  / GLYCOLAX ) 17 g packet Take 17 g by mouth daily. (Patient not taking: Reported on 03/07/2024)    prochlorperazine  (COMPAZINE ) 10 MG tablet Take 1 tablet (10 mg total) by mouth every 6 (six) hours as needed. (Patient not taking: Reported on 03/07/2024)    senna (SENOKOT) 8.6 MG TABS tablet Take 1 tablet (8.6 mg total) by mouth 2 (two) times daily. (Patient not taking: Reported on 03/07/2024)    No facility-administered encounter medications on file as of 03/07/2024.     Today's Vitals   03/07/24 1221 03/07/24 1232  BP: 131/74   Pulse: 69   Resp: 18   Temp: (!) 97.1 F (36.2 C)   TempSrc: Temporal   SpO2: 97%   Weight: 253 lb 3.2 oz (114.9 kg)   PainSc:  6    Body mass index is 36.33 kg/m.   ECOG PERFORMANCE STATUS: 1 - Symptomatic but completely ambulatory  PHYSICAL EXAM GENERAL:alert, no distress and comfortable SKIN: no rash  EYES: sclera clear LUNGS: clear with normal breathing effort HEART: regular rate & rhythm, no lower extremity edema. Distal pulses normal extremities are warm ABDOMEN:  abdomen soft, non-tender and normal bowel sounds NEURO: alert & oriented x 3 with fluent speech, no focal motor deficits.  Mildly decreased vibratory sense over the toes per tuning fork exam    CBC    Latest Ref Rng & Units 03/07/2024   12:15 PM 02/08/2024    1:36 PM 01/28/2024    8:26 AM  CBC  WBC 4.0 - 10.5 K/uL 6.2  8.3  7.7   Hemoglobin 13.0 - 17.0 g/dL 84.7  84.5  83.3   Hematocrit 39.0 - 52.0 % 45.6  45.9  49.4   Platelets 150 - 400 K/uL 144  154  144       CMP  Latest Ref Rng & Units 03/07/2024   12:15 PM 02/08/2024    1:36 PM 01/28/2024    8:26 AM  CMP  Glucose 70 - 99 mg/dL 845  887  832   BUN 8 - 23 mg/dL 21  22  21    Creatinine 0.61 - 1.24 mg/dL 8.91  8.68  8.85   Sodium 135 - 145 mmol/L 138  140  138   Potassium 3.5 - 5.1 mmol/L 4.7  4.5  4.7   Chloride 98 - 111 mmol/L 102  104  100   CO2 22 - 32 mmol/L 24  25  27    Calcium 8.9 - 10.3 mg/dL 9.7  9.7  89.7   Total Protein 6.5 - 8.1 g/dL 6.8  6.9  7.0   Total Bilirubin 0.0 - 1.2 mg/dL 0.5  0.4  0.4   Alkaline Phos 38 - 126 U/L 56  60  63   AST 15 - 41 U/L 22  23  22    ALT 0 - 44 U/L 22  27  29        ASSESSMENT & PLAN:  Multiple myeloma-IgG kappa 12/14/2022-SPEP-3.1 g M spike in the gamma region, 11/25/2022-x-ray left shoulder-lytic lesion in the medial aspect of the humeral head and neck with cortical breakthrough CT lumbar spine 12/10/2022-scattered lucent lesions throughout the lumbar spine 12/16/2022-CT cervical spine-expansile destructive lesion at C3 12/17/2022-MRI brain-right 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 numerous enhancing lesions of the skull base and calvarium no evidence of extraosseous component 12/17/2022-MRI of the cervical, thoracic, and lumbar spine-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, clivus, bilateral occipital condyles, ribs, sacrum, and iliac bone, no evidence of pathologic fracture, expansile lesion in C3 extends into the spinal canal by 4 mm  with moderate to severe spinal canal stenosis posterior to C3, mild right foraminal narrowing at T2-T3 and T3-4 related to osseous tumor at T3 12/17/2022-SPEP-2.6 g serum M spike, IgG kappa Bone marrow biopsy 12/18/2022-multiple myeloma with plasma cells involving 60% of the cellular marrow, kappa restricted myeloma FISH panel-QNS Bone survey 12/18/2022-lucencies at the skull, right scapula, left clavicle, lumbar spine, pelvis, and left femoral neck.  Destructive lesion at C3, possible sclerotic lucent lesions in the thoracic spine 12/18/2022 pelvic radiation to C3 and humeral head-12/31/2022 Cycle 1 daratumumab -VRD 01/01/2023 (Velcade  3 weeks on/1 week off) Cycle 2 daratumumab -VRD 01/29/2023, Revlimid  discontinued after 19 days due to a pruritic rash Cycle 3 daratumumab -VRD 02/26/2023, Revlimid  dose reduced to 10 mg Cycle 4 daratumumab -VRD 03/26/2023, Revlimid  10 mg x 18 days, Dex changed to prednisone  Monday Wednesday Friday due to rash; Revlimid  placed on hold 04/08/2023 due to rash at the lower abdominal wall and pruritus Cycle 5 daratumumab /VRD 04/23/2023-patient declines to resume Revlimid  due to rash/pruritus; Velcade  placed on hold 04/23/2023 due to progressive neuropathy; proceeded with daratumumab  and dexamethasone  04/28/2023 appointment with Dr. Russella [facility]-recommend continuing daratumumab  and dexamethasone .  Would attempt to resume lenalidomide  5 mg days 1 through 21 with addition of daily famotidine  and as needed topical steroids; escalate back to 10 mg if tolerated; dexamethasone  20 mg p.o. weekly.  Cycle length 28 days. Cycle 6 Revlimid /dexamethasone  plus daratumumab  05/07/2023 (Revlimid  5 mg days 1 through 21, 28-day cycle; dexamethasone  10 mg weekly (unable to tolerate higher dose of dexamethasone  due to emotional lability); Pepcid  20 mg daily). Zometa  monthly during induction therapy then every 3 months x 2 years Cycle 7 Revlimid /Decadron  plus daratumumab  06/04/2023 (Revlimid  5 mg days 1-21, 28-day cycle,  Decadron  4 mg weekly (Decadron  further  reduced secondary to emotional lability) Cycle 8 Revlimid /Decadron  plus daratumumab  07/02/2023 Cycle 9 Revlimid /Decadron  plus daratumumab  07/30/2023 (he will discontinue home steroids) Myeloma panel at [facility] 07/28/2023, IgG kappa protein, M spike too low to quantify Cycle 10 Revlimid /Decadron  plus daratumumab  08/27/2023 Cycle 11 Revlimid /daratumumab -09/24/2023, Revlimid  starting 09/25/2023 Bone marrow biopsy at [facility] 09/29/2023-normocellular bone marrow with trilineage hematopoiesis and 1% blasts by manual aspirate differential.  No morphologic or overt immunophenotypic evidence of plasma cell neoplasm.  No monotypic plasma cells identified (MRD negative). Treatment held 10/22/2023 per patient request Appointment with Dr. Russella 11/03/2023-recommends continuing daratumumab  monthly and lenalidomide  3 weeks out of 4 indefinitely if effective.  Zometa  4 mg every 3 months for a total of 2 years; restaging myeloma labs every 3 months. Cycle 12 Revlimid /daratumumab  11/05/2023, Revlimid  starting 11/06/2023 Cycle 13 Revlimid /daratumumab  12/03/2023 12/31/2023 treatment placed on hold due to worsening neuropathy symptoms and new diagnosis of heart failure 02/08/2024 daratumumab  resumed; Revlimid  resumed ~02/22/2024   2.  Left shoulder pain-likely secondary to a lytic lesion at the left humeral head/neck 3.  Left chin/distal mandible numbness-likely secondary to myeloma compressing nerve roots at the brainstem or left face 4.  Hypertension 5.  Diabetes 6.  Sleep apnea 7.  Nephrolithiasis-right ureteral calculus urinary tract infection, placement of a right ureter stent right stone dislodgment 10/01/2022 8.  Asthma 9.  Report of rash 04/08/2023-location and appearance 04/23/2023 consistent with skin change related to the Velcade  injection. 10.  Lower extremity edema-venous Doppler negative for DVT in bilateral lower extremity 10/22/2023. 11. Systolic heart failure-EF 35 to 40% on  echocardiogram 12/10/2023; cardiac MRI 12/27/2023 with EF 35%. 12.  Back pain:  MRI cervical spine 12/15/2023-resolution of widespread marrow enhancing lesion, no definite residual tumor MRI lumbar spine 12/15/2023-resolution of previously seen T2 bright and enhancing marrow space lesions, 7 mm indeterminate focus in S2   Disposition:  Mr. Worley appears stable.  He has resumed maintenance daratumumab  and Revlimid  which he is tolerating well overall.  There is no clinical evidence of relapse.   Peripheral neuropathy has worsened since resuming Revlimid , this is likely multifactorial.  We reviewed the risk of progressive symptoms if he continues Revlimid  versus the risk of myeloma relapse if we hold it.  After lengthy discussion he will complete this cycle of Revlimid , will ask nursing to call to assess neuropathy prior to beginning the next cycle. He declined gabapentin for now.   Lightheadedness is likely secondary to diuresis, today's BP Is normal. I recommend cardiology or PCP f/up.  Labs reviewed, we will follow-up the pending myeloma panel and light chains from today.  He will proceed with daratumumab  today as scheduled.  Follow-up with Dara and Zometa  in 1 month, or sooner if needed.   Patient seen with Dr. Cloretta.     All questions were answered. The patient knows to call the clinic with any problems, questions or concerns. No barriers to learning were detected.   Jeffery Grieco K Veleta Yamamoto, NP 03/07/2024  This was a shared visit with Jeffery Higgins.  Mr. Herford has resumed treatment with Revlimid /daratumumab .  We recommended he hold Revlimid  after I discussed the case with Dr. Russella last month.  Mr Bilyeu reports increased neuropathy symptoms since resuming Revlimid .  He will complete the current cycle of Revlimid .  He understands the risk for progressive neuropathy.  We will contact him to assess his neuropathy and CHF symptoms prior to getting another cycle of Revlimid .  He will complete  another treatment with daratumumab  today.  I was present for greater  than 50% of today's visit.  I performed medical decision making.  Jeffery Hof, MD

## 2024-03-08 LAB — KAPPA/LAMBDA LIGHT CHAINS
Kappa free light chain: 16 mg/L (ref 3.3–19.4)
Kappa, lambda light chain ratio: 1.38 (ref 0.26–1.65)
Lambda free light chains: 11.6 mg/L (ref 5.7–26.3)

## 2024-03-10 ENCOUNTER — Telehealth: Payer: Self-pay | Admitting: *Deleted

## 2024-03-10 LAB — MULTIPLE MYELOMA PANEL, SERUM
Albumin SerPl Elph-Mcnc: 4.2 g/dL (ref 2.9–4.4)
Albumin/Glob SerPl: 2.1 — ABNORMAL HIGH (ref 0.7–1.7)
Alpha 1: 0.1 g/dL (ref 0.0–0.4)
Alpha2 Glob SerPl Elph-Mcnc: 0.7 g/dL (ref 0.4–1.0)
B-Globulin SerPl Elph-Mcnc: 0.7 g/dL (ref 0.7–1.3)
Gamma Glob SerPl Elph-Mcnc: 0.6 g/dL (ref 0.4–1.8)
Globulin, Total: 2.1 g/dL — ABNORMAL LOW (ref 2.2–3.9)
IgA: 44 mg/dL — ABNORMAL LOW (ref 61–437)
IgG (Immunoglobin G), Serum: 735 mg/dL (ref 603–1613)
IgM (Immunoglobulin M), Srm: 32 mg/dL (ref 20–172)
Total Protein ELP: 6.3 g/dL (ref 6.0–8.5)

## 2024-03-10 NOTE — Telephone Encounter (Signed)
 Called Jeffery Higgins to confirm if/when he resumed his Revlimid . Reports he started on 03/13/24 (used bottle ordered on 5/6) and last dose will be 7/28. Will notify MD to determine when he needs to return to office to evaluate tolerance.

## 2024-03-14 NOTE — Telephone Encounter (Signed)
 Will refill on 8/13 to start on 8/18

## 2024-03-21 ENCOUNTER — Telehealth: Payer: Self-pay

## 2024-03-21 NOTE — Telephone Encounter (Signed)
 Following up with patient in regards to see how neuropathy has been doing since being off Revlimid  for a week. Patient stated that he has had increased numbness, tingling, states swelling in both hands, feet have improved very little. Per Dr. Cloretta continue to hold Revlimid  until follow-up visit on 04/03/24 . Patient voiced understanding, no further questions.

## 2024-03-29 ENCOUNTER — Encounter: Payer: Self-pay | Admitting: Oncology

## 2024-04-02 ENCOUNTER — Other Ambulatory Visit: Payer: Self-pay | Admitting: Oncology

## 2024-04-03 ENCOUNTER — Inpatient Hospital Stay

## 2024-04-03 ENCOUNTER — Encounter: Payer: Self-pay | Admitting: Nurse Practitioner

## 2024-04-03 ENCOUNTER — Inpatient Hospital Stay: Attending: Nurse Practitioner

## 2024-04-03 ENCOUNTER — Inpatient Hospital Stay (HOSPITAL_BASED_OUTPATIENT_CLINIC_OR_DEPARTMENT_OTHER): Admitting: Nurse Practitioner

## 2024-04-03 VITALS — BP 131/70 | HR 58 | Temp 98.2°F | Resp 18

## 2024-04-03 VITALS — BP 115/72 | HR 66 | Temp 98.0°F | Resp 18 | Ht 70.0 in | Wt 250.5 lb

## 2024-04-03 DIAGNOSIS — Z923 Personal history of irradiation: Secondary | ICD-10-CM | POA: Insufficient documentation

## 2024-04-03 DIAGNOSIS — E1140 Type 2 diabetes mellitus with diabetic neuropathy, unspecified: Secondary | ICD-10-CM | POA: Diagnosis not present

## 2024-04-03 DIAGNOSIS — C9000 Multiple myeloma not having achieved remission: Secondary | ICD-10-CM

## 2024-04-03 DIAGNOSIS — M25512 Pain in left shoulder: Secondary | ICD-10-CM | POA: Diagnosis not present

## 2024-04-03 DIAGNOSIS — Z87440 Personal history of urinary (tract) infections: Secondary | ICD-10-CM | POA: Insufficient documentation

## 2024-04-03 DIAGNOSIS — I110 Hypertensive heart disease with heart failure: Secondary | ICD-10-CM | POA: Diagnosis not present

## 2024-04-03 DIAGNOSIS — G4730 Sleep apnea, unspecified: Secondary | ICD-10-CM | POA: Diagnosis not present

## 2024-04-03 DIAGNOSIS — Z5112 Encounter for antineoplastic immunotherapy: Secondary | ICD-10-CM | POA: Insufficient documentation

## 2024-04-03 DIAGNOSIS — M549 Dorsalgia, unspecified: Secondary | ICD-10-CM | POA: Insufficient documentation

## 2024-04-03 DIAGNOSIS — N200 Calculus of kidney: Secondary | ICD-10-CM | POA: Diagnosis not present

## 2024-04-03 DIAGNOSIS — J45909 Unspecified asthma, uncomplicated: Secondary | ICD-10-CM | POA: Diagnosis not present

## 2024-04-03 LAB — CBC WITH DIFFERENTIAL (CANCER CENTER ONLY)
Abs Immature Granulocytes: 0.02 K/uL (ref 0.00–0.07)
Basophils Absolute: 0.1 K/uL (ref 0.0–0.1)
Basophils Relative: 1 %
Eosinophils Absolute: 0.2 K/uL (ref 0.0–0.5)
Eosinophils Relative: 3 %
HCT: 45.2 % (ref 39.0–52.0)
Hemoglobin: 15 g/dL (ref 13.0–17.0)
Immature Granulocytes: 0 %
Lymphocytes Relative: 20 %
Lymphs Abs: 1.6 K/uL (ref 0.7–4.0)
MCH: 30.7 pg (ref 26.0–34.0)
MCHC: 33.2 g/dL (ref 30.0–36.0)
MCV: 92.4 fL (ref 80.0–100.0)
Monocytes Absolute: 0.4 K/uL (ref 0.1–1.0)
Monocytes Relative: 6 %
Neutro Abs: 5.7 K/uL (ref 1.7–7.7)
Neutrophils Relative %: 70 %
Platelet Count: 179 K/uL (ref 150–400)
RBC: 4.89 MIL/uL (ref 4.22–5.81)
RDW: 15.8 % — ABNORMAL HIGH (ref 11.5–15.5)
WBC Count: 8.1 K/uL (ref 4.0–10.5)
nRBC: 0 % (ref 0.0–0.2)

## 2024-04-03 LAB — CMP (CANCER CENTER ONLY)
ALT: 15 U/L (ref 0–44)
AST: 16 U/L (ref 15–41)
Albumin: 4.3 g/dL (ref 3.5–5.0)
Alkaline Phosphatase: 66 U/L (ref 38–126)
Anion gap: 12 (ref 5–15)
BUN: 20 mg/dL (ref 8–23)
CO2: 25 mmol/L (ref 22–32)
Calcium: 9.7 mg/dL (ref 8.9–10.3)
Chloride: 106 mmol/L (ref 98–111)
Creatinine: 1.05 mg/dL (ref 0.61–1.24)
GFR, Estimated: >60 mL/min (ref >60)
Glucose, Bld: 161 mg/dL — ABNORMAL HIGH (ref 70–99)
Potassium: 4.2 mmol/L (ref 3.5–5.1)
Sodium: 143 mmol/L (ref 135–145)
Total Bilirubin: 0.5 mg/dL (ref 0.0–1.2)
Total Protein: 6.6 g/dL (ref 6.5–8.1)

## 2024-04-03 MED ORDER — DARATUMUMAB-HYALURONIDASE-FIHJ 1800-30000 MG-UT/15ML ~~LOC~~ SOLN
1800.0000 mg | Freq: Once | SUBCUTANEOUS | Status: AC
  Administered 2024-04-03: 1800 mg via SUBCUTANEOUS
  Filled 2024-04-03: qty 15

## 2024-04-03 MED ORDER — ZOLEDRONIC ACID 4 MG/100ML IV SOLN
4.0000 mg | Freq: Once | INTRAVENOUS | Status: AC
  Administered 2024-04-03: 4 mg via INTRAVENOUS
  Filled 2024-04-03: qty 100

## 2024-04-03 MED ORDER — DEXAMETHASONE 4 MG PO TABS
4.0000 mg | ORAL_TABLET | Freq: Once | ORAL | Status: AC
  Administered 2024-04-03: 4 mg via ORAL
  Filled 2024-04-03: qty 1

## 2024-04-03 MED ORDER — ACETAMINOPHEN 325 MG PO TABS
650.0000 mg | ORAL_TABLET | Freq: Once | ORAL | Status: AC
  Administered 2024-04-03: 650 mg via ORAL
  Filled 2024-04-03: qty 2

## 2024-04-03 MED ORDER — DIPHENHYDRAMINE HCL 25 MG PO CAPS
25.0000 mg | ORAL_CAPSULE | Freq: Once | ORAL | Status: AC
  Administered 2024-04-03: 25 mg via ORAL
  Filled 2024-04-03: qty 1

## 2024-04-03 NOTE — Progress Notes (Signed)
 Patient seen by Olam Ned NP today  Vitals are within treatment parameters:Yes   Labs are within treatment parameters: Yes   Treatment plan has been signed: Yes   Per physician team, Patient is ready for treatment and there are NO modifications to the treatment plan.

## 2024-04-03 NOTE — Patient Instructions (Signed)
 [facility] - A DEPT OF [facility]   Discharge Instructions: Thank you for choosing [facility] to provide your oncology and hematology care.   If you have a lab appointment with the Cancer Center, please go directly to the Cancer Center and check in at the registration area.   Wear comfortable clothing and clothing appropriate for easy access to any Portacath or PICC line.   We strive to give you quality time with your provider. You may need to reschedule your appointment if you arrive late (15 or more minutes).  Arriving late affects you and other patients whose appointments are after yours.  Also, if you miss three or more appointments without notifying the office, you may be dismissed from the clinic at the provider's discretion.      For prescription refill requests, have your pharmacy contact our office and allow 72 hours for refills to be completed.    Today you received the following chemotherapy and/or immunotherapy agents DARZALEX  FASPRO and Zometa .   To help prevent nausea and vomiting after your treatment, we encourage you to take your nausea medication as directed.  BELOW ARE SYMPTOMS THAT SHOULD BE REPORTED IMMEDIATELY: *FEVER GREATER THAN 100.4 F (38 C) OR HIGHER *CHILLS OR SWEATING *NAUSEA AND VOMITING THAT IS NOT CONTROLLED WITH YOUR NAUSEA MEDICATION *UNUSUAL SHORTNESS OF BREATH *UNUSUAL BRUISING OR BLEEDING *URINARY PROBLEMS (pain or burning when urinating, or frequent urination) *BOWEL PROBLEMS (unusual diarrhea, constipation, pain near the anus) TENDERNESS IN MOUTH AND THROAT WITH OR WITHOUT PRESENCE OF ULCERS (sore throat, sores in mouth, or a toothache) UNUSUAL RASH, SWELLING OR PAIN  UNUSUAL VAGINAL DISCHARGE OR ITCHING   Items with * indicate a potential emergency and should be followed up as soon as possible or go to the Emergency Department if any problems should occur.  Please show the CHEMOTHERAPY ALERT CARD  or IMMUNOTHERAPY ALERT CARD at check-in to the Emergency Department and triage nurse.  Should you have questions after your visit or need to cancel or reschedule your appointment, please contact [facility] - A DEPT OF [facility]  Dept: [phone]  and follow the prompts.  Office hours are 8:00 a.m. to 4:30 p.m. Monday - Friday. Please note that voicemails left after 4:00 p.m. may not be returned until the following business day.  We are closed weekends and major holidays. You have access to a nurse at all times for urgent questions. Please call the main number to the clinic Dept: [phone] and follow the prompts.   For any non-urgent questions, you may also contact your provider using MyChart. We now offer e-Visits for anyone 38 and older to request care online for non-urgent symptoms. For details visit [email or web address].   Also download the MyChart app! Go to the app store, search MyChart, open the app, select [place], and log in with your MyChart username and password.  Daratumumab ; Hyaluronidase  Injection What is this medication? DARATUMUMAB ; HYALURONIDASE  (dar a toom ue mab; hye al ur ON i dase) treats multiple myeloma, a type of bone marrow cancer. Daratumumab  works by blocking a protein that causes cancer cells to grow and multiply. This helps to slow or stop the spread of cancer cells. Hyaluronidase  works by increasing the absorption of other medications in the body to help them work better. This medication may also be used treat amyloidosis, a condition that causes the buildup of a protein (amyloid) in your body. It works by  reducing the buildup of this protein, which decreases symptoms. It is a combination medication that contains a monoclonal antibody. This medicine may be used for other purposes; ask your health care provider or pharmacist if you have questions. COMMON BRAND NAME(S): DARZALEX  FASPRO What should I tell my care team before I  take this medication? They need to know if you have any of these conditions: Heart disease Infection, such as chickenpox, cold sores, herpes, hepatitis B Lung or breathing disease An unusual or allergic reaction to daratumumab , hyaluronidase , other medications, foods, dyes, or preservatives Pregnant or trying to get pregnant Breast-feeding How should I use this medication? This medication is injected under the skin. It is given by your care team in a hospital or clinic setting. Talk to your care team about the use of this medication in children. Special care may be needed. Overdosage: If you think you have taken too much of this medicine contact a poison control center or emergency room at once. NOTE: This medicine is only for you. Do not share this medicine with others. What if I miss a dose? Keep appointments for follow-up doses. It is important not to miss your dose. Call your care team if you are unable to keep an appointment. What may interact with this medication? Interactions have not been studied. This list may not describe all possible interactions. Give your health care provider a list of all the medicines, herbs, non-prescription drugs, or dietary supplements you use. Also tell them if you smoke, drink alcohol, or use illegal drugs. Some items may interact with your medicine. What should I watch for while using this medication? Your condition will be monitored carefully while you are receiving this medication. This medication can cause serious allergic reactions. To reduce your risk, your care team may give you other medication to take before receiving this one. Be sure to follow the directions from your care team. This medication can affect the results of blood tests to match your blood type. These changes can last for up to 6 months after the final dose. Your care team will do blood tests to match your blood type before you start treatment. Tell all of your care team that you are  being treated with this medication before receiving a blood transfusion. This medication can affect the results of some tests used to determine treatment response; extra tests may be needed to evaluate response. Talk to your care team if you wish to become pregnant or think you are pregnant. This medication can cause serious birth defects if taken during pregnancy and for 3 months after the last dose. A reliable form of contraception is recommended while taking this medication and for 3 months after the last dose. Talk to your care team about effective forms of contraception. Do not breast-feed while taking this medication. What side effects may I notice from receiving this medication? Side effects that you should report to your care team as soon as possible: Allergic reactions--skin rash, itching, hives, swelling of the face, lips, tongue, or throat Heart rhythm changes--fast or irregular heartbeat, dizziness, feeling faint or lightheaded, chest pain, trouble breathing Infection--fever, chills, cough, sore throat, wounds that don't heal, pain or trouble when passing urine, general feeling of discomfort or being unwell Infusion reactions--chest pain, shortness of breath or trouble breathing, feeling faint or lightheaded Sudden eye pain or change in vision such as blurry vision, seeing halos around lights, vision loss Unusual bruising or bleeding Side effects that usually do not require medical attention (  report to your care team if they continue or are bothersome): Constipation Diarrhea Fatigue Nausea Pain, tingling, or numbness in the hands or feet Swelling of the ankles, hands, or feet This list may not describe all possible side effects. Call your doctor for medical advice about side effects. You may report side effects to FDA at 1-800-FDA-1088. Where should I keep my medication? This medication is given in a hospital or clinic. It will not be stored at home. NOTE: This sheet is a summary.  It may not cover all possible information. If you have questions about this medicine, talk to your doctor, pharmacist, or health care provider.  2024 Elsevier/Gold Standard (2021-12-09 00:00:00)

## 2024-04-03 NOTE — Progress Notes (Signed)
 Pt declined to wait after the Darzalex  Faspro injection. Vitals assessed.

## 2024-04-03 NOTE — Progress Notes (Signed)
 [facility] OFFICE PROGRESS NOTE   Diagnosis: Multiple myeloma  INTERVAL HISTORY:   [name] returns as scheduled.  He continues maintenance daratumumab .  Lenalidomide  has been on hold due to increased neuropathy symptoms.  He reports persistent numbness in the toes.  Hands are better.  He typically has numbness/tingling in his hands in the morning hours, improves with activity.  He denies nausea/vomiting.  No mouth sores.  No diarrhea.  No signs of a reaction following daratumumab .  He reports being diagnosed with bronchitis a week ago.  He is feeling much better.  Objective:  Vital signs in last 24 hours:  Blood pressure 115/72, pulse 66, temperature 98 F (36.7 C), temperature source Temporal, resp. rate 18, height 5' 10 (1.778 m), weight 250 lb 8 oz (113.6 kg), SpO2 98%.    HEENT: No thrush or ulcers. Resp: Lungs clear bilaterally. Cardio: Regular rate and rhythm. GI: No hepatosplenomegaly. Vascular: No leg edema. Skin: No rash.   Lab Results:  Lab Results  Component Value Date   WBC 8.1 04/03/2024   HGB 15.0 04/03/2024   HCT 45.2 04/03/2024   MCV 92.4 04/03/2024   PLT 179 04/03/2024   NEUTROABS 5.7 04/03/2024    Imaging:  No results found.  Medications: I have reviewed the patient's current medications.  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 numerous enhancing lesions of the skull base and calvarium no evidence of extraosseous component 12/17/2022-MRI of the cervical, thoracic, and lumbar spine-diffuse osseous metastatic disease throughout  the spine, clivus, bilateral occipital condyles, ribs, sacrum, and iliac bone, no evidence of pathologic fracture, expansile lesion in C3 extends into the spinal canal by 4 mm with moderate to severe spinal canal stenosis posterior to C3, mild right foraminal narrowing at T2-T3 and T3-4 related to osseous tumor at T3 12/17/2022-SPEP-2.6 g serum M spike, IgG kappa Bone marrow biopsy 12/18/2022-multiple myeloma with plasma cells involving 60% of the cellular marrow, kappa restricted myeloma FISH panel-QNS Bone survey 12/18/2022-lucencies at the skull, right scapula, left clavicle, lumbar spine, pelvis, and left femoral neck.  Destructive lesion at C3, possible sclerotic lucent lesions in the thoracic spine 12/18/2022 pelvic radiation to C3 and humeral head-12/31/2022 Cycle 1 daratumumab -VRD 01/01/2023 (Velcade  3 weeks on/1 week off) Cycle 2 daratumumab -VRD 01/29/2023, Revlimid  discontinued after 19 days due to a pruritic rash Cycle 3 daratumumab -VRD 02/26/2023, Revlimid  dose reduced to 10 mg Cycle 4 daratumumab -VRD 03/26/2023, Revlimid  10 mg x 18 days, Dex changed to prednisone  Monday Wednesday Friday due to rash; Revlimid  placed on hold 04/08/2023 due to rash at the lower abdominal wall and pruritus Cycle 5 daratumumab /VRD 04/23/2023-patient declines to resume Revlimid  due to rash/pruritus; Velcade  placed on hold 04/23/2023 due to progressive neuropathy; proceeded with daratumumab  and dexamethasone  04/28/2023 appointment with Dr. Russella [facility]-recommend continuing daratumumab  and dexamethasone .  Would attempt to resume lenalidomide  5 mg days 1 through 21 with addition of daily famotidine  and as needed topical steroids; escalate back to 10 mg if tolerated; dexamethasone  20 mg p.o. weekly.  Cycle length 28 days. Cycle 6 Revlimid /dexamethasone  plus daratumumab  05/07/2023 (Revlimid  5 mg days 1 through 21, 28-day cycle; dexamethasone  10 mg weekly (unable to tolerate higher dose of dexamethasone  due to emotional lability); Pepcid   20  mg daily). Zometa  monthly during induction therapy then every 3 months x 2 years Cycle 7 Revlimid /Decadron  plus daratumumab  06/04/2023 (Revlimid  5 mg days 1-21, 28-day cycle, Decadron  4 mg weekly (Decadron  further reduced secondary to emotional lability) Cycle 8 Revlimid /Decadron  plus daratumumab  07/02/2023 Cycle 9 Revlimid /Decadron  plus daratumumab  07/30/2023 (he will discontinue home steroids) Myeloma panel at [facility] 07/28/2023, IgG kappa protein, M spike too low to quantify Cycle 10 Revlimid /Decadron  plus daratumumab  08/27/2023 Cycle 11 Revlimid /daratumumab -09/24/2023, Revlimid  starting 09/25/2023 Bone marrow biopsy at [facility] 09/29/2023-normocellular bone marrow with trilineage hematopoiesis and 1% blasts by manual aspirate differential.  No morphologic or overt immunophenotypic evidence of plasma cell neoplasm.  No monotypic plasma cells identified (MRD negative). Treatment held 10/22/2023 per patient request Appointment with Dr. Russella 11/03/2023-recommends continuing daratumumab  monthly and lenalidomide  3 weeks out of 4 indefinitely if effective.  Zometa  4 mg every 3 months for a total of 2 years; restaging myeloma labs every 3 months. Cycle 12 Revlimid /daratumumab  11/05/2023, Revlimid  starting 11/06/2023 Cycle 13 Revlimid /daratumumab  12/03/2023 12/31/2023 treatment placed on hold due to worsening neuropathy symptoms and new diagnosis of heart failure 02/08/2024 daratumumab  resumed; Revlimid  resumed ~02/22/2024;  Revlimid  placed on hold 03/21/2024 due to increased neuropathy symptoms Persistent neuropathy symptoms 04/03/2024, Revlimid  remains on hold, daratumumab  continued   2.  Left shoulder pain-likely secondary to a lytic lesion at the left humeral head/neck 3.  Left chin/distal mandible numbness-likely secondary to myeloma compressing nerve roots at the brainstem or left face 4.  Hypertension 5.  Diabetes 6.  Sleep apnea 7.  Nephrolithiasis-right ureteral calculus urinary tract infection, placement of a  right ureter stent right stone dislodgment 10/01/2022 8.  Asthma 9.  Report of rash 04/08/2023-location and appearance 04/23/2023 consistent with skin change related to the Velcade  injection. 10.  Lower extremity edema-venous Doppler negative for DVT in bilateral lower extremity 10/22/2023. 11. Systolic heart failure-EF 35 to 40% on echocardiogram 12/10/2023; cardiac MRI 12/27/2023 with EF 35%. 12.  Back pain:  MRI cervical spine 12/15/2023-resolution of widespread marrow enhancing lesion, no definite residual tumor MRI lumbar spine 12/15/2023-resolution of previously seen T2 bright and enhancing marrow space lesions, 7 mm indeterminate focus in S2  Disposition: Mr. Duplantis appears stable.  He will continue daratumumab  maintenance.  Continue to hold Revlimid  due to neuropathy symptoms.  We will check myeloma labs at his next appointment.  CBC and chemistry panel reviewed.  Labs adequate for treatment.  He will return for follow-up and daratumumab  in 4 weeks.  He will contact the office in the interim with any problems.    Olam Ned ANP/GNP-BC   04/03/2024  12:00 PM

## 2024-04-05 ENCOUNTER — Other Ambulatory Visit

## 2024-04-05 ENCOUNTER — Ambulatory Visit: Admitting: Nurse Practitioner

## 2024-04-05 ENCOUNTER — Ambulatory Visit

## 2024-04-25 NOTE — Therapy (Signed)
 OUTPATIENT PHYSICAL THERAPY THORACOLUMBAR EVALUATION   Patient Name: Jeffery Higgins MRN: [id number] DOB:Apr 13, 1956, 68 y.o., male Today's Date: 04/25/2024  END OF SESSION:   Past Medical History:  Diagnosis Date   Arthritis    Asthma    BMI 40.0-44.9, adult (HCC) 08/03/2012   Diabetes mellitus type 2 in obese 08/03/2012   Diabetes mellitus without complication (HCC)    History of kidney stones 08/18/2003   Hypertension    Hypertension 08/03/2012   Kidney stone    Sleep apnea    not using CPAP - could not tolerate   Past Surgical History:  Procedure Laterality Date   CHOLECYSTECTOMY  08/02/2012   Procedure: LAPAROSCOPIC CHOLECYSTECTOMY WITH INTRAOPERATIVE CHOLANGIOGRAM;  Surgeon: Morene ONEIDA Olives, MD;  Location: [facility] OR;  Service: General;  Laterality: N/A;  laparoscopic cholecystectomy with intraoperative cholangiogram   KNEE ARTHROSCOPY     LITHOTRIPSY     SHOULDER ARTHROSCOPY WITH SUBACROMIAL DECOMPRESSION  07/07/2012   Procedure: SHOULDER ARTHROSCOPY WITH SUBACROMIAL DECOMPRESSION;  Surgeon: Franky CHRISTELLA Pointer, MD;  Location: [facility] OR;  Service: Orthopedics;  Laterality: Right;  with distal clavicle resection   Patient Active Problem List   Diagnosis Date Noted   Multiple myeloma without remission (HCC) 12/18/2022   Constipation 12/17/2022   Metastatic cancer to spine (HCC) 12/17/2022   Cholecystitis with cholelithiasis 08/03/2012   Type 2 diabetes mellitus with obesity (HCC) 08/03/2012   Hypertension 08/03/2012   Sleep apnea 08/03/2012   BMI 40.0-44.9, adult (HCC) 08/03/2012    PCP: No PCP  REFERRING PROVIDER: Reyes Budge  REFERRING DIAG: spondylolisthesis, lumbar region   Rationale for Evaluation and Treatment: Rehabilitation  THERAPY DIAG:  No diagnosis found.  ONSET DATE: 03/14/24  SUBJECTIVE:                                                                                                                                                                                            SUBJECTIVE STATEMENT: ***  PERTINENT HISTORY:  See above  PAIN:  Are you having pain? {OPRCPAIN:27236}  PRECAUTIONS: {Therapy precautions:24002}  RED FLAGS: {PT Red Flags:29287}   WEIGHT BEARING RESTRICTIONS: {Yes ***/No:24003}  FALLS:  Has patient fallen in last 6 months? {fallsyesno:27318}  LIVING ENVIRONMENT: Lives with: {OPRC lives with:25569::lives with their family} Lives in: {Lives in:25570} Stairs: {opstairs:27293} Has following equipment at home: {Assistive devices:23999}  OCCUPATION: ***  PLOF: {PLOF:24004}  PATIENT GOALS: ***  NEXT MD VISIT: ***  OBJECTIVE:  Note: Objective measures were completed at Evaluation unless otherwise noted.  DIAGNOSTIC FINDINGS:  ***  PATIENT SURVEYS:  {rehab surveys:24030}  COGNITION: Overall cognitive status: {cognition:24006}     SENSATION: {sensation:27233}  MUSCLE LENGTH: Hamstrings: Right *** deg; Left *** deg Debby test: Right *** deg; Left *** deg  POSTURE: {posture:25561}  PALPATION: ***  LUMBAR ROM:   AROM eval  Flexion   Extension   Right lateral flexion   Left lateral flexion   Right rotation   Left rotation    (Blank rows = not tested)  LOWER EXTREMITY ROM:     {AROM/PROM:27142}  Right eval Left eval  Hip flexion    Hip extension    Hip abduction    Hip adduction    Hip internal rotation    Hip external rotation    Knee flexion    Knee extension    Ankle dorsiflexion    Ankle plantarflexion    Ankle inversion    Ankle eversion     (Blank rows = not tested)  LOWER EXTREMITY MMT:    MMT Right eval Left eval  Hip flexion    Hip extension    Hip abduction    Hip adduction    Hip internal rotation    Hip external rotation    Knee flexion    Knee extension    Ankle dorsiflexion    Ankle plantarflexion    Ankle inversion    Ankle eversion     (Blank rows = not tested)  LUMBAR SPECIAL TESTS:  {lumbar special test:25242}  FUNCTIONAL TESTS:   {Functional tests:24029}  GAIT: Distance walked: *** Assistive device utilized: {Assistive devices:23999} Level of assistance: {Levels of assistance:24026} Comments: ***  TREATMENT DATE: ***                                                                                                                                 PATIENT EDUCATION:  Education details: *** Person educated: {Person educated:25204} Education method: {Education Method:25205} Education comprehension: {Education Comprehension:25206}  HOME EXERCISE PROGRAM: ***  ASSESSMENT:  CLINICAL IMPRESSION: Patient is a *** y.o. *** who was seen today for physical therapy evaluation and treatment for ***.   OBJECTIVE IMPAIRMENTS: {opptimpairments:25111}.   ACTIVITY LIMITATIONS: {activitylimitations:27494}  PARTICIPATION LIMITATIONS: {participationrestrictions:25113}  PERSONAL FACTORS: {Personal factors:25162} are also affecting patient's functional outcome.   REHAB POTENTIAL: {rehabpotential:25112}  CLINICAL DECISION MAKING: {clinical decision making:25114}  EVALUATION COMPLEXITY: {Evaluation complexity:25115}   GOALS: Goals reviewed with patient? {yes/no:20286}  SHORT TERM GOALS: Target date: ***  *** Baseline: Goal status: INITIAL  2.  *** Baseline:  Goal status: INITIAL  3.  *** Baseline:  Goal status: INITIAL  4.  *** Baseline:  Goal status: INITIAL  5.  *** Baseline:  Goal status: INITIAL  6.  *** Baseline:  Goal status: INITIAL  LONG TERM GOALS: Target date: ***  *** Baseline:  Goal status: INITIAL  2.  *** Baseline:  Goal status: INITIAL  3.  *** Baseline:  Goal status: INITIAL  4.  *** Baseline:  Goal status: INITIAL  5.  *** Baseline:  Goal status: INITIAL  6.  *** Baseline:  Goal status: INITIAL  PLAN:  PT FREQUENCY: {rehab frequency:25116}  PT DURATION: {rehab duration:25117}  PLANNED INTERVENTIONS: {rehab planned  interventions:25118::97110-Therapeutic exercises,97530- Therapeutic [phone]- Neuromuscular re-education,97535- Self Rjmz,02859- Manual therapy}.  PLAN FOR NEXT SESSION: PIERRETTE Almetta Fam, PT 04/25/2024, 4:38 PM

## 2024-04-26 ENCOUNTER — Ambulatory Visit: Attending: Neurosurgery

## 2024-04-26 DIAGNOSIS — M5459 Other low back pain: Secondary | ICD-10-CM | POA: Diagnosis present

## 2024-04-26 DIAGNOSIS — M4316 Spondylolisthesis, lumbar region: Secondary | ICD-10-CM | POA: Insufficient documentation

## 2024-04-26 DIAGNOSIS — R2689 Other abnormalities of gait and mobility: Secondary | ICD-10-CM | POA: Diagnosis present

## 2024-04-26 DIAGNOSIS — M5416 Radiculopathy, lumbar region: Secondary | ICD-10-CM | POA: Diagnosis present

## 2024-04-28 ENCOUNTER — Ambulatory Visit: Admitting: Physician Assistant

## 2024-04-30 ENCOUNTER — Other Ambulatory Visit: Payer: Self-pay | Admitting: Oncology

## 2024-05-02 ENCOUNTER — Inpatient Hospital Stay (HOSPITAL_BASED_OUTPATIENT_CLINIC_OR_DEPARTMENT_OTHER): Admitting: Oncology

## 2024-05-02 ENCOUNTER — Inpatient Hospital Stay: Attending: Nurse Practitioner

## 2024-05-02 ENCOUNTER — Inpatient Hospital Stay

## 2024-05-02 VITALS — BP 129/67 | HR 77 | Temp 97.8°F | Resp 18 | Ht 70.0 in | Wt 252.5 lb

## 2024-05-02 DIAGNOSIS — Z7983 Long term (current) use of bisphosphonates: Secondary | ICD-10-CM | POA: Diagnosis not present

## 2024-05-02 DIAGNOSIS — G893 Neoplasm related pain (acute) (chronic): Secondary | ICD-10-CM | POA: Insufficient documentation

## 2024-05-02 DIAGNOSIS — M545 Low back pain, unspecified: Secondary | ICD-10-CM | POA: Insufficient documentation

## 2024-05-02 DIAGNOSIS — I110 Hypertensive heart disease with heart failure: Secondary | ICD-10-CM | POA: Diagnosis not present

## 2024-05-02 DIAGNOSIS — Z5112 Encounter for antineoplastic immunotherapy: Secondary | ICD-10-CM | POA: Diagnosis not present

## 2024-05-02 DIAGNOSIS — I10 Essential (primary) hypertension: Secondary | ICD-10-CM | POA: Insufficient documentation

## 2024-05-02 DIAGNOSIS — R21 Rash and other nonspecific skin eruption: Secondary | ICD-10-CM | POA: Diagnosis not present

## 2024-05-02 DIAGNOSIS — G8929 Other chronic pain: Secondary | ICD-10-CM | POA: Insufficient documentation

## 2024-05-02 DIAGNOSIS — G4730 Sleep apnea, unspecified: Secondary | ICD-10-CM | POA: Insufficient documentation

## 2024-05-02 DIAGNOSIS — Z87442 Personal history of urinary calculi: Secondary | ICD-10-CM | POA: Insufficient documentation

## 2024-05-02 DIAGNOSIS — C9000 Multiple myeloma not having achieved remission: Secondary | ICD-10-CM

## 2024-05-02 DIAGNOSIS — E1140 Type 2 diabetes mellitus with diabetic neuropathy, unspecified: Secondary | ICD-10-CM | POA: Insufficient documentation

## 2024-05-02 DIAGNOSIS — E119 Type 2 diabetes mellitus without complications: Secondary | ICD-10-CM | POA: Insufficient documentation

## 2024-05-02 DIAGNOSIS — I5020 Unspecified systolic (congestive) heart failure: Secondary | ICD-10-CM | POA: Insufficient documentation

## 2024-05-02 LAB — CBC WITH DIFFERENTIAL (CANCER CENTER ONLY)
Abs Immature Granulocytes: 0.02 K/uL (ref 0.00–0.07)
Basophils Absolute: 0 K/uL (ref 0.0–0.1)
Basophils Relative: 1 %
Eosinophils Absolute: 0.1 K/uL (ref 0.0–0.5)
Eosinophils Relative: 2 %
HCT: 43.8 % (ref 39.0–52.0)
Hemoglobin: 14.4 g/dL (ref 13.0–17.0)
Immature Granulocytes: 0 %
Lymphocytes Relative: 22 %
Lymphs Abs: 1.6 K/uL (ref 0.7–4.0)
MCH: 30.4 pg (ref 26.0–34.0)
MCHC: 32.9 g/dL (ref 30.0–36.0)
MCV: 92.4 fL (ref 80.0–100.0)
Monocytes Absolute: 0.5 K/uL (ref 0.1–1.0)
Monocytes Relative: 7 %
Neutro Abs: 5 K/uL (ref 1.7–7.7)
Neutrophils Relative %: 68 %
Platelet Count: 172 K/uL (ref 150–400)
RBC: 4.74 MIL/uL (ref 4.22–5.81)
RDW: 14.9 % (ref 11.5–15.5)
WBC Count: 7.3 K/uL (ref 4.0–10.5)
nRBC: 0 % (ref 0.0–0.2)

## 2024-05-02 LAB — CMP (CANCER CENTER ONLY)
ALT: 13 U/L (ref 0–44)
AST: 18 U/L (ref 15–41)
Albumin: 4.3 g/dL (ref 3.5–5.0)
Alkaline Phosphatase: 65 U/L (ref 38–126)
Anion gap: 12 (ref 5–15)
BUN: 16 mg/dL (ref 8–23)
CO2: 23 mmol/L (ref 22–32)
Calcium: 9.3 mg/dL (ref 8.9–10.3)
Chloride: 104 mmol/L (ref 98–111)
Creatinine: 0.9 mg/dL (ref 0.61–1.24)
GFR, Estimated: >60 mL/min (ref >60)
Glucose, Bld: 192 mg/dL — ABNORMAL HIGH (ref 70–99)
Potassium: 4.1 mmol/L (ref 3.5–5.1)
Sodium: 139 mmol/L (ref 135–145)
Total Bilirubin: 0.4 mg/dL (ref 0.0–1.2)
Total Protein: 6.4 g/dL — ABNORMAL LOW (ref 6.5–8.1)

## 2024-05-02 MED ORDER — DEXAMETHASONE 4 MG PO TABS
4.0000 mg | ORAL_TABLET | Freq: Once | ORAL | Status: AC
  Administered 2024-05-02: 4 mg via ORAL
  Filled 2024-05-02: qty 1

## 2024-05-02 MED ORDER — DARATUMUMAB-HYALURONIDASE-FIHJ 1800-30000 MG-UT/15ML ~~LOC~~ SOLN
1800.0000 mg | Freq: Once | SUBCUTANEOUS | Status: AC
  Administered 2024-05-02: 1800 mg via SUBCUTANEOUS
  Filled 2024-05-02: qty 15

## 2024-05-02 MED ORDER — ACETAMINOPHEN 325 MG PO TABS
650.0000 mg | ORAL_TABLET | Freq: Once | ORAL | Status: AC
  Administered 2024-05-02: 650 mg via ORAL
  Filled 2024-05-02: qty 2

## 2024-05-02 MED ORDER — DIPHENHYDRAMINE HCL 25 MG PO CAPS
25.0000 mg | ORAL_CAPSULE | Freq: Once | ORAL | Status: AC
  Administered 2024-05-02: 25 mg via ORAL
  Filled 2024-05-02: qty 1

## 2024-05-02 NOTE — Progress Notes (Signed)
 Patient seen by Dr. Arley Hof today  Vitals are within treatment parameters:Yes   Labs are within treatment parameters: Yes   Treatment plan has been signed: Yes   Per physician team, Patient is ready for treatment and there are NO modifications to the treatment plan.

## 2024-05-02 NOTE — Patient Instructions (Signed)
 [facility] - A DEPT OF [facility]  Discharge Instructions: Thank you for choosing [facility] to provide your oncology and hematology care.   If you have a lab appointment with the Cancer Center, please go directly to the Cancer Center and check in at the registration area.   Wear comfortable clothing and clothing appropriate for easy access to any Portacath or PICC line.   We strive to give you quality time with your provider. You may need to reschedule your appointment if you arrive late (15 or more minutes).  Arriving late affects you and other patients whose appointments are after yours.  Also, if you miss three or more appointments without notifying the office, you may be dismissed from the clinic at the provider's discretion.      For prescription refill requests, have your pharmacy contact our office and allow 72 hours for refills to be completed.    Today you received the following chemotherapy and/or immunotherapy agents: darzlex      To help prevent nausea and vomiting after your treatment, we encourage you to take your nausea medication as directed.  BELOW ARE SYMPTOMS THAT SHOULD BE REPORTED IMMEDIATELY: *FEVER GREATER THAN 100.4 F (38 C) OR HIGHER *CHILLS OR SWEATING *NAUSEA AND VOMITING THAT IS NOT CONTROLLED WITH YOUR NAUSEA MEDICATION *UNUSUAL SHORTNESS OF BREATH *UNUSUAL BRUISING OR BLEEDING *URINARY PROBLEMS (pain or burning when urinating, or frequent urination) *BOWEL PROBLEMS (unusual diarrhea, constipation, pain near the anus) TENDERNESS IN MOUTH AND THROAT WITH OR WITHOUT PRESENCE OF ULCERS (sore throat, sores in mouth, or a toothache) UNUSUAL RASH, SWELLING OR PAIN  UNUSUAL VAGINAL DISCHARGE OR ITCHING   Items with * indicate a potential emergency and should be followed up as soon as possible or go to the Emergency Department if any problems should occur.  Please show the CHEMOTHERAPY ALERT CARD or IMMUNOTHERAPY  ALERT CARD at check-in to the Emergency Department and triage nurse.  Should you have questions after your visit or need to cancel or reschedule your appointment, please contact [facility] - A DEPT OF [facility]  Dept: [phone]  and follow the prompts.  Office hours are 8:00 a.m. to 4:30 p.m. Monday - Friday. Please note that voicemails left after 4:00 p.m. may not be returned until the following business day.  We are closed weekends and major holidays. You have access to a nurse at all times for urgent questions. Please call the main number to the clinic Dept: [phone] and follow the prompts.   For any non-urgent questions, you may also contact your provider using MyChart. We now offer e-Visits for anyone 60 and older to request care online for non-urgent symptoms. For details visit [email or web address].   Also download the MyChart app! Go to the app store, search MyChart, open the app, select [facility], and log in with your MyChart username and password.

## 2024-05-02 NOTE — Progress Notes (Signed)
 [facility] OFFICE PROGRESS NOTE   Diagnosis: Multiple myeloma  INTERVAL HISTORY:   Jeffery Higgins turns as scheduled.  He completed another treatment with daratumumab /Decadron  04/03/2024.  He remains off of Revlimid .  He has persistent neuropathy symptoms.  He has chronic low back pain.  He is followed by Dr. Mavis.  No dyspnea or swelling.  No new complaint.  Objective:  Vital signs in last 24 hours:  Blood pressure 129/67, pulse 77, temperature 97.8 F (36.6 C), temperature source Temporal, resp. rate 18, height 5' 10 (1.778 m), weight 252 lb 8 oz (114.5 kg), SpO2 98%.    HEENT: No thrush Resp: Lungs clear bilaterally Cardio: Regular rate and rhythm GI: No hepatosplenomegaly Vascular: No leg edema  Lab Results:  Lab Results  Component Value Date   WBC 7.3 05/02/2024   HGB 14.4 05/02/2024   HCT 43.8 05/02/2024   MCV 92.4 05/02/2024   PLT 172 05/02/2024   NEUTROABS 5.0 05/02/2024    CMP  Lab Results  Component Value Date   NA 139 05/02/2024   K 4.1 05/02/2024   CL 104 05/02/2024   CO2 23 05/02/2024   GLUCOSE 192 (H) 05/02/2024   BUN 16 05/02/2024   CREATININE 0.90 05/02/2024   CALCIUM 9.3 05/02/2024   PROT 6.4 (L) 05/02/2024   ALBUMIN 4.3 05/02/2024   AST 18 05/02/2024   ALT 13 05/02/2024   ALKPHOS 65 05/02/2024   BILITOT 0.4 05/02/2024   GFRNONAA >60 05/02/2024   GFRAA >90 08/02/2012    No results found for: CEA1, CEA, CAN199, CA125  No results found for: INR, LABPROT  Imaging:  No results found.  Medications: I have reviewed the patient's current medications.   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 numerous enhancing lesions of the skull base and calvarium no evidence of extraosseous component 12/17/2022-MRI of the cervical, thoracic, and lumbar spine-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, clivus, bilateral occipital condyles, ribs, sacrum, and iliac bone, no evidence of pathologic fracture, expansile lesion in C3 extends into the spinal canal by 4 mm with moderate to severe spinal canal stenosis posterior to C3, mild right foraminal narrowing at T2-T3 and T3-4 related to osseous tumor at T3 12/17/2022-SPEP-2.6 g serum M spike, IgG kappa Bone marrow biopsy 12/18/2022-multiple myeloma with plasma cells involving 60% of the cellular marrow, kappa restricted myeloma FISH panel-QNS Bone survey 12/18/2022-lucencies at the skull, right scapula, left clavicle, lumbar spine, pelvis, and left femoral neck.  Destructive lesion at C3, possible sclerotic lucent lesions in the thoracic spine 12/18/2022 pelvic radiation to C3 and humeral head-12/31/2022 Cycle 1 daratumumab -VRD 01/01/2023 (Velcade  3 weeks on/1 week off) Cycle 2 daratumumab -VRD 01/29/2023, Revlimid  discontinued after 19 days due to a pruritic rash Cycle 3 daratumumab -VRD 02/26/2023, Revlimid  dose reduced to 10 mg Cycle 4 daratumumab -VRD 03/26/2023, Revlimid  10 mg x 18 days, Dex changed to prednisone  Monday Wednesday Friday due to rash; Revlimid  placed on hold 04/08/2023 due to rash at the lower abdominal wall and pruritus Cycle 5 daratumumab /VRD 04/23/2023-patient declines to resume Revlimid  due to rash/pruritus; Velcade  placed on hold 04/23/2023 due to progressive neuropathy; proceeded with daratumumab  and dexamethasone  04/28/2023 appointment with Dr. Russ[facility]-recommend continuing daratumumab  and dexamethasone .  Would attempt to resume lenalidomide  5 mg days 1 through 21 with addition of daily famotidine  and as needed topical steroids; escalate back to  10 mg if tolerated; dexamethasone  20 mg p.o. weekly.  Cycle length 28 days. Cycle 6 Revlimid /dexamethasone  plus daratumumab  05/07/2023 (Revlimid  5 mg days 1 through 21, 28-day cycle; dexamethasone  10 mg weekly (unable to tolerate higher dose of dexamethasone  due to emotional lability); Pepcid  20 mg daily). Zometa  monthly during induction therapy then every 3 months x 2 years Cycle 7 Revlimid /Decadron  plus daratumumab  06/04/2023 (Revlimid  5 mg days 1-21, 28-day cycle, Decadron  4 mg weekly (Decadron  further reduced secondary to emotional lability) Cycle 8 Revlimid /Decadron  plus daratumumab  07/02/2023 Cycle 9 Revlimid /Decadron  plus daratumumab  07/30/2023 (he will discontinue home steroids) Myeloma panel at [facility] 07/28/2023, IgG kappa protein, M spike too low to quantify Cycle 10 Revlimid /Decadron  plus daratumumab  08/27/2023 Cycle 11 Revlimid /daratumumab -09/24/2023, Revlimid  starting 09/25/2023 Bone marrow biopsy at [facility] 09/29/2023-normocellular bone marrow with trilineage hematopoiesis and 1% blasts by manual aspirate differential.  No morphologic or overt immunophenotypic evidence of plasma cell neoplasm.  No monotypic plasma cells identified (MRD negative). Treatment held 10/22/2023 per patient request Appointment with Dr. Russella 11/03/2023-recommends continuing daratumumab  monthly and lenalidomide  3 weeks out of 4 indefinitely if effective.  Zometa  4 mg every 3 months for a total of 2 years; restaging myeloma labs every 3 months. Cycle 12 Revlimid /daratumumab  11/05/2023, Revlimid  starting 11/06/2023 Cycle 13 Revlimid /daratumumab  12/03/2023 12/31/2023 treatment placed on hold due to worsening neuropathy symptoms and new diagnosis of heart failure 02/08/2024 daratumumab  resumed; Revlimid  resumed ~02/22/2024;  Revlimid  placed on hold 03/21/2024 due to increased neuropathy symptoms Persistent neuropathy symptoms 04/03/2024, Revlimid  remains on hold, daratumumab  continued   2.  Left shoulder pain-likely secondary to a  lytic lesion at the left humeral head/neck 3.  Left chin/distal mandible numbness-likely secondary to myeloma compressing nerve roots at the brainstem or left face 4.  Hypertension 5.  Diabetes 6.  Sleep apnea 7.  Nephrolithiasis-right ureteral calculus urinary tract infection, placement of a right ureter stent right stone dislodgment 10/01/2022 8.  Asthma 9.  Report of rash 04/08/2023-location and appearance 04/23/2023 consistent with skin change related to the Velcade  injection. 10.  Lower extremity edema-venous Doppler negative for DVT in bilateral lower extremity 10/22/2023. 11. Systolic heart failure-EF 35 to 40% on echocardiogram 12/10/2023; cardiac MRI 12/27/2023 with EF 35%. 12.  Back pain:  MRI cervical spine 12/15/2023-resolution of widespread marrow enhancing lesion, no definite residual tumor MRI lumbar spine 12/15/2023-resolution of previously seen T2 bright and enhancing marrow space lesions, 7 mm indeterminate focus in S2    Disposition: Jeffery Higgins appears unchanged.  He is in clinical remission from multiple myeloma.  No monoclonal protein was detected on 09/08/2023.  The plan is to continue monthly daratumumab .  Revlimid  remains on hold secondary to neuropathy and heart failure.  He will be due for another treatment with Zometa  in November.  We will check a myeloma panel next month.    Arley Hof, MD  05/02/2024  1:38 PM

## 2024-05-03 ENCOUNTER — Telehealth: Payer: Self-pay | Admitting: Oncology

## 2024-05-03 LAB — KAPPA/LAMBDA LIGHT CHAINS
Kappa free light chain: 10.3 mg/L (ref 3.3–19.4)
Kappa, lambda light chain ratio: 1.11 (ref 0.26–1.65)
Lambda free light chains: 9.3 mg/L (ref 5.7–26.3)

## 2024-05-03 NOTE — Telephone Encounter (Signed)
 Patient has been scheduled for follow-up visit per 05/03/24 LOS.  Pt noted appt details on personal planner/calendar.

## 2024-05-04 ENCOUNTER — Other Ambulatory Visit: Payer: Self-pay

## 2024-05-06 LAB — MULTIPLE MYELOMA PANEL, SERUM
Albumin SerPl Elph-Mcnc: 3.7 g/dL (ref 2.9–4.4)
Albumin/Glob SerPl: 1.6 (ref 0.7–1.7)
Alpha 1: 0.2 g/dL (ref 0.0–0.4)
Alpha2 Glob SerPl Elph-Mcnc: 0.8 g/dL (ref 0.4–1.0)
B-Globulin SerPl Elph-Mcnc: 0.8 g/dL (ref 0.7–1.3)
Gamma Glob SerPl Elph-Mcnc: 0.7 g/dL (ref 0.4–1.8)
Globulin, Total: 2.4 g/dL (ref 2.2–3.9)
IgA: 59 mg/dL — ABNORMAL LOW (ref 61–437)
IgG (Immunoglobin G), Serum: 710 mg/dL (ref 603–1613)
IgM (Immunoglobulin M), Srm: 25 mg/dL (ref 20–172)
Total Protein ELP: 6.1 g/dL (ref 6.0–8.5)

## 2024-05-08 ENCOUNTER — Ambulatory Visit: Payer: Self-pay | Admitting: Oncology

## 2024-05-09 ENCOUNTER — Encounter: Payer: Self-pay | Admitting: Oncology

## 2024-05-09 NOTE — Telephone Encounter (Signed)
 Patient gave verbal understanding and had no further question

## 2024-05-09 NOTE — Telephone Encounter (Signed)
-----   Message from Arley Hof sent at 05/08/2024  5:56 PM EDT ----- Please call patient, myeloma panel is negative, f/u as scheduled  ----- Message ----- From: Debby Olam POUR, NP Sent: 05/08/2024   4:17 PM EDT To: Arley KATHEE Hof, MD   ----- Message ----- From: Rebecka, [facility] [place] Sent: 05/03/2024   3:36 PM EDT To: Olam POUR Debby, NP

## 2024-05-10 ENCOUNTER — Ambulatory Visit: Admitting: Physical Therapy

## 2024-05-10 DIAGNOSIS — M5459 Other low back pain: Secondary | ICD-10-CM

## 2024-05-10 DIAGNOSIS — M5416 Radiculopathy, lumbar region: Secondary | ICD-10-CM | POA: Diagnosis not present

## 2024-05-10 DIAGNOSIS — M4316 Spondylolisthesis, lumbar region: Secondary | ICD-10-CM

## 2024-05-10 DIAGNOSIS — R2689 Other abnormalities of gait and mobility: Secondary | ICD-10-CM
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
## 2024-05-11 ENCOUNTER — Encounter: Payer: Self-pay | Admitting: Physical Therapy

## 2024-05-11 ENCOUNTER — Ambulatory Visit: Admitting: Physical Therapy

## 2024-05-11 DIAGNOSIS — M5459 Other low back pain: Secondary | ICD-10-CM

## 2024-05-11 DIAGNOSIS — M5416 Radiculopathy, lumbar region: Secondary | ICD-10-CM | POA: Diagnosis not present

## 2024-05-11 DIAGNOSIS — M4316 Spondylolisthesis, lumbar region: Secondary | ICD-10-CM

## 2024-05-11 DIAGNOSIS — R2689 Other abnormalities of gait and mobility: Secondary | ICD-10-CM
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
## 2024-05-22 ENCOUNTER — Ambulatory Visit: Admitting: Physician Assistant

## 2024-05-28 ENCOUNTER — Other Ambulatory Visit: Payer: Self-pay

## 2024-05-30 ENCOUNTER — Other Ambulatory Visit

## 2024-05-30 ENCOUNTER — Ambulatory Visit: Admitting: Nurse Practitioner

## 2024-05-30 ENCOUNTER — Ambulatory Visit

## 2024-05-31 ENCOUNTER — Encounter: Payer: Self-pay | Admitting: Nurse Practitioner

## 2024-05-31 ENCOUNTER — Inpatient Hospital Stay

## 2024-05-31 ENCOUNTER — Other Ambulatory Visit

## 2024-05-31 ENCOUNTER — Inpatient Hospital Stay: Attending: Nurse Practitioner

## 2024-05-31 ENCOUNTER — Ambulatory Visit: Attending: Neurosurgery

## 2024-05-31 ENCOUNTER — Inpatient Hospital Stay (HOSPITAL_BASED_OUTPATIENT_CLINIC_OR_DEPARTMENT_OTHER): Admitting: Nurse Practitioner

## 2024-05-31 VITALS — BP 145/76 | HR 62 | Temp 97.9°F | Resp 16

## 2024-05-31 VITALS — BP 140/72 | HR 60 | Temp 97.6°F | Resp 18 | Ht 70.0 in | Wt 245.8 lb

## 2024-05-31 DIAGNOSIS — N644 Mastodynia: Secondary | ICD-10-CM | POA: Diagnosis not present

## 2024-05-31 DIAGNOSIS — Z87442 Personal history of urinary calculi: Secondary | ICD-10-CM | POA: Diagnosis not present

## 2024-05-31 DIAGNOSIS — E1140 Type 2 diabetes mellitus with diabetic neuropathy, unspecified: Secondary | ICD-10-CM | POA: Diagnosis not present

## 2024-05-31 DIAGNOSIS — G4730 Sleep apnea, unspecified: Secondary | ICD-10-CM | POA: Insufficient documentation

## 2024-05-31 DIAGNOSIS — M549 Dorsalgia, unspecified: Secondary | ICD-10-CM | POA: Insufficient documentation

## 2024-05-31 DIAGNOSIS — M5459 Other low back pain: Secondary | ICD-10-CM | POA: Insufficient documentation

## 2024-05-31 DIAGNOSIS — M4316 Spondylolisthesis, lumbar region: Secondary | ICD-10-CM | POA: Insufficient documentation

## 2024-05-31 DIAGNOSIS — J45909 Unspecified asthma, uncomplicated: Secondary | ICD-10-CM | POA: Diagnosis not present

## 2024-05-31 DIAGNOSIS — Z5112 Encounter for antineoplastic immunotherapy: Secondary | ICD-10-CM | POA: Diagnosis not present

## 2024-05-31 DIAGNOSIS — G629 Polyneuropathy, unspecified: Secondary | ICD-10-CM | POA: Diagnosis not present

## 2024-05-31 DIAGNOSIS — I110 Hypertensive heart disease with heart failure: Secondary | ICD-10-CM | POA: Insufficient documentation

## 2024-05-31 DIAGNOSIS — C9000 Multiple myeloma not having achieved remission: Secondary | ICD-10-CM | POA: Insufficient documentation

## 2024-05-31 DIAGNOSIS — Z87440 Personal history of urinary (tract) infections: Secondary | ICD-10-CM | POA: Diagnosis not present

## 2024-05-31 DIAGNOSIS — M5416 Radiculopathy, lumbar region: Secondary | ICD-10-CM | POA: Insufficient documentation

## 2024-05-31 DIAGNOSIS — R2689 Other abnormalities of gait and mobility: Secondary | ICD-10-CM | POA: Insufficient documentation

## 2024-05-31 LAB — CBC WITH DIFFERENTIAL (CANCER CENTER ONLY)
Abs Immature Granulocytes: 0.02 K/uL (ref 0.00–0.07)
Basophils Absolute: 0.1 K/uL (ref 0.0–0.1)
Basophils Relative: 1 %
Eosinophils Absolute: 0.3 K/uL (ref 0.0–0.5)
Eosinophils Relative: 4 %
HCT: 45 % (ref 39.0–52.0)
Hemoglobin: 14.9 g/dL (ref 13.0–17.0)
Immature Granulocytes: 0 %
Lymphocytes Relative: 22 %
Lymphs Abs: 1.9 K/uL (ref 0.7–4.0)
MCH: 31 pg (ref 26.0–34.0)
MCHC: 33.1 g/dL (ref 30.0–36.0)
MCV: 93.8 fL (ref 80.0–100.0)
Monocytes Absolute: 0.6 K/uL (ref 0.1–1.0)
Monocytes Relative: 7 %
Neutro Abs: 5.8 K/uL (ref 1.7–7.7)
Neutrophils Relative %: 66 %
Platelet Count: 194 K/uL (ref 150–400)
RBC: 4.8 MIL/uL (ref 4.22–5.81)
RDW: 14.1 % (ref 11.5–15.5)
WBC Count: 8.7 K/uL (ref 4.0–10.5)
nRBC: 0 % (ref 0.0–0.2)

## 2024-05-31 LAB — CMP (CANCER CENTER ONLY)
ALT: 17 U/L (ref 0–44)
AST: 19 U/L (ref 15–41)
Albumin: 4.3 g/dL (ref 3.5–5.0)
Alkaline Phosphatase: 59 U/L (ref 38–126)
Anion gap: 8 (ref 5–15)
BUN: 13 mg/dL (ref 8–23)
CO2: 30 mmol/L (ref 22–32)
Calcium: 9.7 mg/dL (ref 8.9–10.3)
Chloride: 107 mmol/L (ref 98–111)
Creatinine: 0.98 mg/dL (ref 0.61–1.24)
GFR, Estimated: >60 mL/min (ref >60)
Glucose, Bld: 104 mg/dL — ABNORMAL HIGH (ref 70–99)
Potassium: 4.1 mmol/L (ref 3.5–5.1)
Sodium: 144 mmol/L (ref 135–145)
Total Bilirubin: 0.4 mg/dL (ref 0.0–1.2)
Total Protein: 6.6 g/dL (ref 6.5–8.1)

## 2024-05-31 MED ORDER — DIPHENHYDRAMINE HCL 25 MG PO CAPS
25.0000 mg | ORAL_CAPSULE | Freq: Once | ORAL | Status: AC
  Administered 2024-05-31: 25 mg via ORAL
  Filled 2024-05-31: qty 1

## 2024-05-31 MED ORDER — ACETAMINOPHEN 325 MG PO TABS
650.0000 mg | ORAL_TABLET | Freq: Once | ORAL | Status: AC
  Administered 2024-05-31: 650 mg via ORAL
  Filled 2024-05-31: qty 2

## 2024-05-31 MED ORDER — DARATUMUMAB-HYALURONIDASE-FIHJ 1800-30000 MG-UT/15ML ~~LOC~~ SOLN
1800.0000 mg | Freq: Once | SUBCUTANEOUS | Status: AC
  Administered 2024-05-31: 1800 mg via SUBCUTANEOUS
  Filled 2024-05-31: qty 15

## 2024-05-31 MED ORDER — LENALIDOMIDE 5 MG PO CAPS: 5.0000 mg | ORAL_CAPSULE | Freq: Every day | ORAL | 0 refills | Status: DC

## 2024-05-31 NOTE — Patient Instructions (Signed)
 [facility] - A DEPT OF [facility]  Discharge Instructions: Thank you for choosing [facility] to provide your oncology and hematology care.   If you have a lab appointment with the Cancer Center, please go directly to the Cancer Center and check in at the registration area.   Wear comfortable clothing and clothing appropriate for easy access to any Portacath or PICC line.   We strive to give you quality time with your provider. You may need to reschedule your appointment if you arrive late (15 or more minutes).  Arriving late affects you and other patients whose appointments are after yours.  Also, if you miss three or more appointments without notifying the office, you may be dismissed from the clinic at the provider's discretion.      For prescription refill requests, have your pharmacy contact our office and allow 72 hours for refills to be completed.    Today you received the following chemotherapy and/or immunotherapy agents: darzalex  faspro.     To help prevent nausea and vomiting after your treatment, we encourage you to take your nausea medication as directed.  BELOW ARE SYMPTOMS THAT SHOULD BE REPORTED IMMEDIATELY: *FEVER GREATER THAN 100.4 F (38 C) OR HIGHER *CHILLS OR SWEATING *NAUSEA AND VOMITING THAT IS NOT CONTROLLED WITH YOUR NAUSEA MEDICATION *UNUSUAL SHORTNESS OF BREATH *UNUSUAL BRUISING OR BLEEDING *URINARY PROBLEMS (pain or burning when urinating, or frequent urination) *BOWEL PROBLEMS (unusual diarrhea, constipation, pain near the anus) TENDERNESS IN MOUTH AND THROAT WITH OR WITHOUT PRESENCE OF ULCERS (sore throat, sores in mouth, or a toothache) UNUSUAL RASH, SWELLING OR PAIN  UNUSUAL VAGINAL DISCHARGE OR ITCHING   Items with * indicate a potential emergency and should be followed up as soon as possible or go to the Emergency Department if any problems should occur.  Please show the CHEMOTHERAPY ALERT CARD or  IMMUNOTHERAPY ALERT CARD at check-in to the Emergency Department and triage nurse.  Should you have questions after your visit or need to cancel or reschedule your appointment, please contact [facility] - A DEPT OF [facility]  Dept: [phone]  and follow the prompts.  Office hours are 8:00 a.m. to 4:30 p.m. Monday - Friday. Please note that voicemails left after 4:00 p.m. may not be returned until the following business day.  We are closed weekends and major holidays. You have access to a nurse at all times for urgent questions. Please call the main number to the clinic Dept: [phone] and follow the prompts.   For any non-urgent questions, you may also contact your provider using MyChart. We now offer e-Visits for anyone 71 and older to request care online for non-urgent symptoms. For details visit [email or web address].   Also download the MyChart app! Go to the app store, search MyChart, open the app, select [facility], and log in with your MyChart username and password.  Daratumumab ; Hyaluronidase  Injection What is this medication? DARATUMUMAB ; HYALURONIDASE  (dar a toom ue mab; hye al ur ON i dase) treats multiple myeloma, a type of bone marrow cancer. Daratumumab  works by blocking a protein that causes cancer cells to grow and multiply. This helps to slow or stop the spread of cancer cells. Hyaluronidase  works by increasing the absorption of other medications in the body to help them work better. This medication may also be used treat amyloidosis, a condition that causes the buildup of a protein (amyloid) in your body. It works by reducing  the buildup of this protein, which decreases symptoms. It is a combination medication that contains a monoclonal antibody. This medicine may be used for other purposes; ask your health care provider or pharmacist if you have questions. COMMON BRAND NAME(S): DARZALEX  FASPRO What should I tell my care team before I take  this medication? They need to know if you have any of these conditions: Heart disease Infection, such as chickenpox, cold sores, herpes, hepatitis B Lung or breathing disease An unusual or allergic reaction to daratumumab , hyaluronidase , other medications, foods, dyes, or preservatives Pregnant or trying to get pregnant Breast-feeding How should I use this medication? This medication is injected under the skin. It is given by your care team in a hospital or clinic setting. Talk to your care team about the use of this medication in children. Special care may be needed. Overdosage: If you think you have taken too much of this medicine contact a poison control center or emergency room at once. NOTE: This medicine is only for you. Do not share this medicine with others. What if I miss a dose? Keep appointments for follow-up doses. It is important not to miss your dose. Call your care team if you are unable to keep an appointment. What may interact with this medication? Interactions have not been studied. This list may not describe all possible interactions. Give your health care provider a list of all the medicines, herbs, non-prescription drugs, or dietary supplements you use. Also tell them if you smoke, drink alcohol, or use illegal drugs. Some items may interact with your medicine. What should I watch for while using this medication? Your condition will be monitored carefully while you are receiving this medication. This medication can cause serious allergic reactions. To reduce your risk, your care team may give you other medication to take before receiving this one. Be sure to follow the directions from your care team. This medication can affect the results of blood tests to match your blood type. These changes can last for up to 6 months after the final dose. Your care team will do blood tests to match your blood type before you start treatment. Tell all of your care team that you are being  treated with this medication before receiving a blood transfusion. This medication can affect the results of some tests used to determine treatment response; extra tests may be needed to evaluate response. Talk to your care team if you wish to become pregnant or think you are pregnant. This medication can cause serious birth defects if taken during pregnancy and for 3 months after the last dose. A reliable form of contraception is recommended while taking this medication and for 3 months after the last dose. Talk to your care team about effective forms of contraception. Do not breast-feed while taking this medication. What side effects may I notice from receiving this medication? Side effects that you should report to your care team as soon as possible: Allergic reactions--skin rash, itching, hives, swelling of the face, lips, tongue, or throat Heart rhythm changes--fast or irregular heartbeat, dizziness, feeling faint or lightheaded, chest pain, trouble breathing Infection--fever, chills, cough, sore throat, wounds that don't heal, pain or trouble when passing urine, general feeling of discomfort or being unwell Infusion reactions--chest pain, shortness of breath or trouble breathing, feeling faint or lightheaded Sudden eye pain or change in vision such as blurry vision, seeing halos around lights, vision loss Unusual bruising or bleeding Side effects that usually do not require medical attention (report  to your care team if they continue or are bothersome): Constipation Diarrhea Fatigue Nausea Pain, tingling, or numbness in the hands or feet Swelling of the ankles, hands, or feet This list may not describe all possible side effects. Call your doctor for medical advice about side effects. You may report side effects to FDA at 1-800-FDA-1088. Where should I keep my medication? This medication is given in a hospital or clinic. It will not be stored at home. NOTE: This sheet is a summary. It may  not cover all possible information. If you have questions about this medicine, talk to your doctor, pharmacist, or health care provider.  2024 Elsevier/Gold Standard (2021-12-09 00:00:00)

## 2024-05-31 NOTE — Progress Notes (Signed)
 [facility] OFFICE PROGRESS NOTE   Diagnosis:  Multiple myeloma  INTERVAL HISTORY:   Jeffery Higgins returns as scheduled.  He continues monthly Daratumumab .  No shortness of breath or wheezing following daratumumab .  He denies nausea/vomiting.  No mouth sores.  No diarrhea.  No rash.  He feels neuropathy symptoms overall are better.  He notes mild tingling in the feet, no significant symptoms in his hands.  Main complaint today is left nipple soreness.  Objective:  Vital signs in last 24 hours:  Blood pressure (!) 140/72, pulse 60, temperature 97.6 F (36.4 C), temperature source Temporal, resp. rate 18, height 5' 10 (1.778 m), weight 245 lb 12.8 oz (111.5 kg), SpO2 100%.    HEENT: No thrush or ulcers. Resp: Lungs clear bilaterally. Cardio: Regular rate and rhythm. GI: No hepatosplenomegaly. Vascular: No leg edema. Skin: No rash. Breast: Bilateral breast fullness, left greater than right.  Left breast is tender.  No mass palpated.   Lab Results:  Lab Results  Component Value Date   WBC 8.7 05/31/2024   HGB 14.9 05/31/2024   HCT 45.0 05/31/2024   MCV 93.8 05/31/2024   PLT 194 05/31/2024   NEUTROABS 5.8 05/31/2024    Imaging:  No results found.  Medications: I have reviewed the patient's current medications.  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 numerous enhancing lesions of the skull base and calvarium no evidence of extraosseous component 12/17/2022-MRI of the cervical, thoracic, and lumbar spine-diffuse osseous metastatic disease throughout the  spine, clivus, bilateral occipital condyles, ribs, sacrum, and iliac bone, no evidence of pathologic fracture, expansile lesion in C3 extends into the spinal canal by 4 mm with moderate to severe spinal canal stenosis posterior to C3, mild right foraminal narrowing at T2-T3 and T3-4 related to osseous tumor at T3 12/17/2022-SPEP-2.6 g serum M spike, IgG kappa Bone marrow biopsy 12/18/2022-multiple myeloma with plasma cells involving 60% of the cellular marrow, kappa restricted myeloma FISH panel-QNS Bone survey 12/18/2022-lucencies at the skull, right scapula, left clavicle, lumbar spine, pelvis, and left femoral neck.  Destructive lesion at C3, possible sclerotic lucent lesions in the thoracic spine 12/18/2022 pelvic radiation to C3 and humeral head-12/31/2022 Cycle 1 daratumumab -VRD 01/01/2023 (Velcade  3 weeks on/1 week off) Cycle 2 daratumumab -VRD 01/29/2023, Revlimid  discontinued after 19 days due to a pruritic rash Cycle 3 daratumumab -VRD 02/26/2023, Revlimid  dose reduced to 10 mg Cycle 4 daratumumab -VRD 03/26/2023, Revlimid  10 mg x 18 days, Dex changed to prednisone  Monday Wednesday Friday due to rash; Revlimid  placed on hold 04/08/2023 due to rash at the lower abdominal wall and pruritus Cycle 5 daratumumab /VRD 04/23/2023-patient declines to resume Revlimid  due to rash/pruritus; Velcade  placed on hold 04/23/2023 due to progressive neuropathy; proceeded with daratumumab  and dexamethasone  04/28/2023 appointment with Jeffery Higgins [facility]-recommend continuing daratumumab  and dexamethasone .  Would attempt to resume lenalidomide  5 mg days 1 through 21 with addition of daily famotidine  and as needed topical steroids; escalate back to 10 mg if tolerated; dexamethasone  20 mg p.o. weekly.  Cycle length 28 days. Cycle 6 Revlimid /dexamethasone  plus daratumumab  05/07/2023 (Revlimid  5 mg days 1 through 21, 28-day cycle; dexamethasone  10 mg weekly (unable to tolerate higher dose of dexamethasone  due to emotional lability); Pepcid  20  mg  daily). Zometa  monthly during induction therapy then every 3 months x 2 years Cycle 7 Revlimid /Decadron  plus daratumumab  06/04/2023 (Revlimid  5 mg days 1-21, 28-day cycle, Decadron  4 mg weekly (Decadron  further reduced secondary to emotional lability) Cycle 8 Revlimid /Decadron  plus daratumumab  07/02/2023 Cycle 9 Revlimid /Decadron  plus daratumumab  07/30/2023 (he will discontinue home steroids) Myeloma panel at [facility] 07/28/2023, IgG kappa protein, M spike too low to quantify Cycle 10 Revlimid /Decadron  plus daratumumab  08/27/2023 Cycle 11 Revlimid /daratumumab -09/24/2023, Revlimid  starting 09/25/2023 Bone marrow biopsy at [facility] 09/29/2023-normocellular bone marrow with trilineage hematopoiesis and 1% blasts by manual aspirate differential.  No morphologic or overt immunophenotypic evidence of plasma cell neoplasm.  No monotypic plasma cells identified (MRD negative). Treatment held 10/22/2023 per patient request Appointment with Jeffery Higgins 11/03/2023-recommends continuing daratumumab  monthly and lenalidomide  3 weeks out of 4 indefinitely if effective.  Zometa  4 mg every 3 months for a total of 2 years; restaging myeloma labs every 3 months. Cycle 12 Revlimid /daratumumab  11/05/2023, Revlimid  starting 11/06/2023 Cycle 13 Revlimid /daratumumab  12/03/2023 12/31/2023 treatment placed on hold due to worsening neuropathy symptoms and new diagnosis of heart failure 02/08/2024 daratumumab  resumed; Revlimid  resumed ~02/22/2024;  Revlimid  placed on hold 03/21/2024 due to increased neuropathy symptoms Persistent neuropathy symptoms 04/03/2024, Revlimid  remains on hold, daratumumab  continued Appointment with Jeffery Higgins 05/09/2024-recommendations to continue daratumumab  per standard schedule, resume lenalidomide  5 mg 21 days on/7 days off, discontinue dexamethasone ; continue Zometa  every 3 months for a total of 2 years.   2.  Left shoulder pain-likely secondary to a lytic lesion at the left humeral head/neck 3.  Left chin/distal  mandible numbness-likely secondary to myeloma compressing nerve roots at the brainstem or left face 4.  Hypertension 5.  Diabetes 6.  Sleep apnea 7.  Nephrolithiasis-right ureteral calculus urinary tract infection, placement of a right ureter stent right stone dislodgment 10/01/2022 8.  Asthma 9.  Report of rash 04/08/2023-location and appearance 04/23/2023 consistent with skin change related to the Velcade  injection. 10.  Lower extremity edema-venous Doppler negative for DVT in bilateral lower extremity 10/22/2023. 11. Systolic heart failure-EF 35 to 40% on echocardiogram 12/10/2023; cardiac MRI 12/27/2023 with EF 35%. 12.  Back pain:  MRI cervical spine 12/15/2023-resolution of widespread marrow enhancing lesion, no definite residual tumor MRI lumbar spine 12/15/2023-resolution of previously seen T2 bright and enhancing marrow space lesions, 7 mm indeterminate focus in S2 13.  Bilateral breast enlargement-question gynecomastia      Disposition: Jeffery Higgins appears stable.  He continues monthly daratumumab .  We reviewed Dr. Catherne recommendation to resume lenalidomide  5 mg days 1 through 21 of a 28-day cycle.  Jeffery Higgins is in agreement.  Plan to proceed with daratumumab  today as scheduled.  Dexamethasone  will be discontinued.  New lenalidomide  prescription will be placed today.  He will let us  know the start date.  Zometa  due next month.  CBC and chemistry panel reviewed.  Labs adequate for treatment.  He reports recent left breast/nipple pain.  On exam he appears to have gynecomastia.  This could be related to spironolactone.  He will contact his cardiologist.  He will return for follow-up, daratumumab  and Zometa  in 1 month.  We are available to see him sooner if needed.  Patient seen with Dr. Cloretta.  Jeffery Higgins   05/31/2024  8:23 AM  This was a shared visit with Jeffery Higgins.  Jeffery Higgins remains in remission from multiple myeloma.  We reviewed the 05/09/2024 note from Dr.  Russella.  He recommends resuming lenalidomide .  We discussed the potential for  a rash and progressive neuropathy symptoms with lenalidomide .  He agrees to proceed.  He will begin loratadine.  Jeffery Higgins will continue Zometa , aspirin, and antiviral prophylaxis.  Gynecomastia is likely secondary to spironolactone.  Arvella Hof, MD
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
## 2024-05-31 NOTE — Progress Notes (Signed)
 Patient seen by Olam Ned NP today  Vitals are within treatment parameters:Yes   Labs are within treatment parameters: Yes   Treatment plan has been signed: Yes   Per physician team, Patient is ready for treatment and there are NO modifications to the treatment plan.

## 2024-06-01 ENCOUNTER — Telehealth: Payer: Self-pay | Admitting: *Deleted

## 2024-06-01 LAB — KAPPA/LAMBDA LIGHT CHAINS
Kappa free light chain: 11 mg/L (ref 3.3–19.4)
Kappa, lambda light chain ratio: 0.93 (ref 0.26–1.65)
Lambda free light chains: 11.8 mg/L (ref 5.7–26.3)
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
## 2024-06-01 NOTE — Telephone Encounter (Signed)
 NP wanted to remind Jeffery Higgins to start loratadine when he starts the Revlimid . His voice mail was full and his wife's was not available either.

## 2024-06-02 ENCOUNTER — Ambulatory Visit

## 2024-06-02 ENCOUNTER — Telehealth: Payer: Self-pay

## 2024-06-02 DIAGNOSIS — M5416 Radiculopathy, lumbar region: Secondary | ICD-10-CM

## 2024-06-02 DIAGNOSIS — R2689 Other abnormalities of gait and mobility: Secondary | ICD-10-CM

## 2024-06-02 DIAGNOSIS — M5459 Other low back pain: Secondary | ICD-10-CM | POA: Diagnosis present

## 2024-06-02 DIAGNOSIS — M4316 Spondylolisthesis, lumbar region: Secondary | ICD-10-CM | POA: Diagnosis present

## 2024-06-02 NOTE — Telephone Encounter (Signed)
-----   Message from Olam Ned sent at 06/01/2024  8:50 AM EDT ----- Please remind him to start loratadine when he begins lenalidomide .

## 2024-06-02 NOTE — Telephone Encounter (Signed)
 Patient gave verbal understanding and had no further questions.

## 2024-06-05 LAB — MULTIPLE MYELOMA PANEL, SERUM
Albumin SerPl Elph-Mcnc: 3.6 g/dL (ref 2.9–4.4)
Albumin/Glob SerPl: 1.5 (ref 0.7–1.7)
Alpha 1: 0.2 g/dL (ref 0.0–0.4)
Alpha2 Glob SerPl Elph-Mcnc: 0.8 g/dL (ref 0.4–1.0)
B-Globulin SerPl Elph-Mcnc: 0.8 g/dL (ref 0.7–1.3)
Gamma Glob SerPl Elph-Mcnc: 0.7 g/dL (ref 0.4–1.8)
Globulin, Total: 2.5 g/dL (ref 2.2–3.9)
IgA: 54 mg/dL — ABNORMAL LOW (ref 61–437)
IgG (Immunoglobin G), Serum: 734 mg/dL (ref 603–1613)
IgM (Immunoglobulin M), Srm: 29 mg/dL (ref 20–172)
Total Protein ELP: 6.1 g/dL (ref 6.0–8.5)

## 2024-06-07 ENCOUNTER — Encounter: Payer: Self-pay | Admitting: Physical Therapy

## 2024-06-07 ENCOUNTER — Ambulatory Visit: Admitting: Physical Therapy

## 2024-06-07 DIAGNOSIS — M5416 Radiculopathy, lumbar region: Secondary | ICD-10-CM

## 2024-06-07 DIAGNOSIS — M5459 Other low back pain: Secondary | ICD-10-CM

## 2024-06-07 DIAGNOSIS — M4316 Spondylolisthesis, lumbar region: Secondary | ICD-10-CM

## 2024-06-07 DIAGNOSIS — R2689 Other abnormalities of gait and mobility: Secondary | ICD-10-CM
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
## 2024-06-08 ENCOUNTER — Other Ambulatory Visit: Payer: Self-pay

## 2024-06-09 ENCOUNTER — Encounter: Payer: Self-pay | Admitting: Physical Therapy

## 2024-06-09 ENCOUNTER — Ambulatory Visit: Admitting: Physical Therapy

## 2024-06-09 DIAGNOSIS — M5416 Radiculopathy, lumbar region: Secondary | ICD-10-CM | POA: Diagnosis not present

## 2024-06-09 DIAGNOSIS — M5459 Other low back pain: Secondary | ICD-10-CM

## 2024-06-09 DIAGNOSIS — R2689 Other abnormalities of gait and mobility: Secondary | ICD-10-CM

## 2024-06-09 DIAGNOSIS — M4316 Spondylolisthesis, lumbar region: Secondary | ICD-10-CM
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
## 2024-06-11 ENCOUNTER — Other Ambulatory Visit: Payer: Self-pay

## 2024-06-13 ENCOUNTER — Ambulatory Visit

## 2024-06-16 ENCOUNTER — Ambulatory Visit

## 2024-06-16 DIAGNOSIS — R2689 Other abnormalities of gait and mobility: Secondary | ICD-10-CM

## 2024-06-16 DIAGNOSIS — M5416 Radiculopathy, lumbar region: Secondary | ICD-10-CM | POA: Diagnosis not present

## 2024-06-16 DIAGNOSIS — M5459 Other low back pain: Secondary | ICD-10-CM

## 2024-06-16 DIAGNOSIS — M4316 Spondylolisthesis, lumbar region: Secondary | ICD-10-CM
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
## 2024-06-17 NOTE — Progress Notes (Signed)
 Assessment/Plan:    Mild cognitive impairment*** Memory impairment***  Jeffery Higgins is a very pleasant 68 y.o. 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 Patient is on ***.     Recommendations:   Follow up in   months. Neuropsych testing scheduled or 06/2024 for clarity of diagnosis and disease trajectory   Recommend good control of cardiovascular risk factors Recommend psychotherapy for GAD and situational depression  Continue to control mood with meds as per PCP    Subjective:   This patient is accompanied in the office by ***  who supplements the history. Previous records as well as any outside records available were reviewed prior to todays visit.   Patient was last seen on 10/27/23 with MMSE 28/30.    Any changes in memory since last visit? .Patient has some difficulty remembering recent conversations and names of people, movie plots. HE continues to stay busy, active around the house, building different things, landscapes.   repeats oneself?  Endorsed Disoriented when walking into a room?  Patient denies ***  Misplacing objects?  Patient denies   Wandering behavior?   Denies. Any personality changes since last visit? Denies. HE has a history of GAD, does not see a veterinary surgeon.  Any worsening depression?: denies.   Hallucinations or paranoia?  Denies.   Seizures?   Denies.    Any sleep changes? Sleeps well, ocassional vivid dreams and REM behavior (OSA) Sleep apnea?  Endorsed, non compliant with CPAP***  Any hygiene concerns?   Denies.   Independent of bathing and dressing?  Endorsed  Does the patient needs help with medications? Patient is in charge *** Who is in charge of the finances?  Patient is in charge   *** Any changes in appetite?  denies ***   Patient  have trouble swallowing?  Denies.   Does the patient cook?  Any kitchen accidents such as leaving the stove on?   Denies.   Any headaches?    Denies.   Vision changes? Denies. Chronic pain? Chronic lower back pain  Ambulates with difficulty?    Denies. HE exerises 3 times a week. ***  Recent falls or head injuries?    Denies.      Unilateral weakness, numbness or tingling? 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  Denies.   Any anosmia?    Denies.   Any incontinence of urine?  Urge incontinence.   Any bowel dysfunction?  Chronic constipation, takes MiraLax      Patient lives with his wife.*** Does the patient drive? Yes, denies getting lost*** MRI of the brain, personally reviewed remarkable for numerous enhancing lesions at the skull base and calvarium consistent with multiple myeloma, without evidence of extraosseous component, and branch of the right superior cerebellar artery, compressing terminal segment of the right trigeminal nerve (resolved since), mild atrophy, and mild chronic microvascular changes without acute findings    Initial visit 07/26/2023 How long did patient have memory difficulties? For the last year, after starting his treatments.  Reports some difficulty remembering new information, conversations and names. He forgets movie plots.  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 repeats oneself?  Endorsed by his family Disoriented when walking into a room?  Patient denies except occasionally  not remembering what patient came to the room for    Leaving objects in unusual places? Denies.  He may misplace his wallet   Wandering behavior?  Denies.  Any personality changes?  Been more moody since taking steroids Any history of depression?:  Yes,  anxiety too, does not have a psychotherapist  Hallucinations or paranoia?  Denies   Seizures?  Denies    Any sleep changes?   Sleeps well except having  to wake up to urinate, denies vivid dreams, REM behavior but not frequently, denies  sleepwalking   Sleep apnea? Endorsed, could not tolerate CPAP Any hygiene concerns?  Denies   Independent of bathing and dressing?  Endorsed  Does the patient needs help with medications? Patient is in charge   Who is in charge of the finances? Patient is in charge     Any changes in appetite? Has been gaining some weight  Patient have trouble swallowing? Denies.   Does the patient cook?  Yes , denies forgetting common recipes   Any kitchen accidents such as leaving the stove on? Denies.   Any history of headaches? 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  He has chronic left shoulder pain likely due to lytic lesion in the left humeral head.  Until recently, he had a C3 lesion which has recovered bone tissue, this is followed by Dr. Mavis (neurosurgery) ambulates with difficulty?  Denies.  Recent falls or head injuries? Denies.   Vision changes? Denies.   Unilateral weakness, numbness or tingling?  He had a history  of  compression  of the nerve root at the brainstem causing left chin -distal mandible numbness, resolving 3 months ago. He has B LE neuropathy (chemo induced) Any tremors?   Denies.   Any anosmia?  Denies.   Any incontinence of urine?  Urge incontinence. Doe not wear pads  Any bowel dysfunction? Takes Miralax  for chronic constipation    Patient lives with wife  History of heavy alcohol intake? Denies.   History of heavy tobacco use? Denies.   Family history of dementia? Mother had dementia ?type Does patient drive? Yes, denies getting lost, tries to drive on familiar areas        Past Medical History:  Diagnosis Date   Arthritis    Asthma    BMI 40.0-44.9, adult (HCC) 08/03/2012   Diabetes mellitus type 2 in obese 08/03/2012   Diabetes mellitus without complication (HCC)    History of kidney stones 08/18/2003   Hypertension    Hypertension  08/03/2012   Kidney stone    Sleep apnea    not using CPAP - could not tolerate     Past Surgical History:  Procedure Laterality Date   CHOLECYSTECTOMY  08/02/2012   Procedure: LAPAROSCOPIC CHOLECYSTECTOMY WITH INTRAOPERATIVE CHOLANGIOGRAM;  Surgeon: Morene ONEIDA Olives, MD;  Location: [facility] OR;  Service: General;  Laterality: N/A;  laparoscopic cholecystectomy with intraoperative cholangiogram   KNEE ARTHROSCOPY     LITHOTRIPSY     SHOULDER ARTHROSCOPY WITH SUBACROMIAL DECOMPRESSION  07/07/2012   Procedure: SHOULDER ARTHROSCOPY WITH SUBACROMIAL DECOMPRESSION;  Surgeon: Franky CHRISTELLA Pointer, MD;  Location: [facility] OR;  Service: Orthopedics;  Laterality: Right;  with distal clavicle resection     PREVIOUS MEDICATIONS:   CURRENT MEDICATIONS:  Outpatient Encounter Medications as of 06/20/2024  Medication Sig   acetaminophen  (TYLENOL ) 325 MG tablet Take 2 tablets (650 mg total) by mouth every 6 (six) hours as needed (or Temp > 100).  acyclovir  (ZOVIRAX ) 400 MG tablet TAKE 1 TABLET(400 MG) BY MOUTH TWICE DAILY   albuterol  (PROVENTIL ) (2.5 MG/3ML) 0.083% nebulizer solution Inhale 2.5 mg into the lungs every 6 (six) hours as needed for shortness of breath.   albuterol  (VENTOLIN  HFA) 108 (90 Base) MCG/ACT inhaler Inhale 1 puff into the lungs every 6 (six) hours as needed for shortness of breath.   ALPRAZolam  (XANAX ) 0.5 MG tablet Take 1 tablet (0.5 mg total) by mouth 2 (two) times daily as needed for anxiety. Do not drive while taking (Patient not taking: Reported on 05/31/2024)   amLODipine -atorvastatin (CADUET) 5-40 MG tablet Take 1 tablet by mouth daily.   aspirin EC 81 MG tablet Take 81 mg by mouth daily. Swallow whole.   cyanocobalamin (VITAMIN B12) 1000 MCG tablet Take 1,000 mcg by mouth daily.   cyclobenzaprine  (FLEXERIL ) 10 MG tablet Take 10 mg by mouth 3 (three) times daily as needed for muscle spasms. (Patient not taking: Reported on 05/31/2024)   diphenhydrAMINE  (BENADRYL ) 25 mg capsule Take 25-50  mg by mouth every 6 (six) hours as needed for itching. (Patient not taking: Reported on 05/31/2024)   DULoxetine (CYMBALTA) 30 MG capsule Take 1 capsule by mouth daily. (Patient not taking: Reported on 05/31/2024)   DULoxetine (CYMBALTA) 60 MG capsule Take 60 mg by mouth 2 (two) times daily. (Patient not taking: Reported on 05/31/2024)   empagliflozin (JARDIANCE) 10 MG TABS tablet Take 1 tablet by mouth daily.   ENTRESTO 24-26 MG Take 1 tablet by mouth 2 (two) times daily.   famotidine  (PEPCID ) 20 MG tablet Take 1 tablet (20 mg total) by mouth daily. (Patient not taking: Reported on 05/31/2024)   furosemide (LASIX) 20 MG tablet Take 20 mg by mouth daily.   glipiZIDE (GLUCOTROL XL) 10 MG 24 hr tablet Take 10 mg by mouth daily.   hydrocortisone  2.5 % cream Apply topically 2 (two) times daily. (Patient not taking: Reported on 05/31/2024)   hydrOXYzine  (ATARAX ) 10 MG tablet TAKE 1 TABLET(10 MG) BY MOUTH THREE TIMES DAILY AS NEEDED (Patient not taking: Reported on 05/31/2024)   JANUVIA 100 MG tablet Take 100 mg by mouth daily.   lenalidomide  (REVLIMID ) 5 MG capsule Take 1 capsule (5 mg total) by mouth daily. Take 5 mg daily x 21 days off, then 7 day rest. Repeat every 28 days. Start cycle as soon as received.   oxyCODONE  (ROXICODONE ) 5 MG immediate release tablet Take 1 tablet (5 mg total) by mouth every 4 (four) hours as needed for severe pain.   pantoprazole  (PROTONIX ) 20 MG tablet Take 1 tablet (20 mg total) by mouth daily. (Patient not taking: Reported on 05/31/2024)   Plecanatide (TRULANCE) 3 MG TABS Take 3 mg by mouth daily as needed.   polyethylene glycol (MIRALAX  / GLYCOLAX ) 17 g packet Take 17 g by mouth daily.   prochlorperazine  (COMPAZINE ) 10 MG tablet Take 1 tablet (10 mg total) by mouth every 6 (six) hours as needed. (Patient not taking: Reported on 05/31/2024)   senna (SENOKOT) 8.6 MG TABS tablet Take 1 tablet (8.6 mg total) by mouth 2 (two) times daily. (Patient not taking: Reported on  05/31/2024)   sildenafil (REVATIO) 20 MG tablet Take by mouth as needed.   spironolactone (ALDACTONE) 25 MG tablet Take 1 tablet by mouth daily.   No facility-administered encounter medications on file as of 06/20/2024.     Objective:     PHYSICAL EXAMINATION:    VITALS:  There were no vitals filed for this visit.  GEN:  The patient appears stated age and is in NAD. HEENT:  Normocephalic, atraumatic.   Neurological examination:  General: NAD, well-groomed, appears stated age. Orientation: The patient is alert. Oriented to person, place and not to date.*** Cranial nerves: There is good facial symmetry.The speech is fluent and clear. No aphasia or dysarthria. Fund of knowledge is appropriate. Recent memory impaired and remote memory is normal.  Attention and concentration are normal.  Able to name objects and repeat phrases.  Hearing is intact to conversational tone ***.   Delayed recall *** Sensation: Sensation is intact to light touch throughout Motor: Strength is at least antigravity x4. DTR's 2/4 in UE/LE      07/26/2023    4:00 PM  Montreal Cognitive Assessment   Visuospatial/ Executive (0/5) 4  Naming (0/3) 3  Attention: Read list of digits (0/2) 2  Attention: Read list of letters (0/1) 1  Attention: Serial 7 subtraction starting at 100 (0/3) 3  Language: Repeat phrase (0/2) 1  Language : Fluency (0/1) 1  Abstraction (0/2) 0  Delayed Recall (0/5) 2  Orientation (0/6) 6  Total 23  Adjusted Score (based on education) 23       10/27/2023    4:00 PM  MMSE - Mini Mental State Exam  Orientation to time 3  Orientation to Place 5  Registration 3  Attention/ Calculation 5  Recall 3  Language- name 2 objects 2  Language- repeat 1  Language- follow 3 step command 3  Language- read & follow direction 1  Write a sentence 1  Copy design 1  Total score 28       Movement examination: Tone: There is normal tone in the UE/LE Abnormal movements:  no tremor.  No  myoclonus.  No asterixis.   Coordination:  There is no decremation with RAM's. Normal finger to nose  Gait and Station: The patient has no difficulty arising out of a deep-seated chair without the use of the hands. The patient's stride length is good.  Gait is cautious and narrow.   Thank you for allowing us  the opportunity to participate in the care of this nice patient. Please do not hesitate to contact us  for any questions or concerns.   Total time spent on today's visit was *** minutes dedicated to this patient today, preparing to see patient, examining the patient, ordering tests and/or medications and counseling the patient, documenting clinical information in the EHR or other health record, independently interpreting results and communicating results to the patient/family, discussing treatment and goals, answering patient's questions and coordinating care.  Cc:  Pcp, No  [name] 06/17/2024 3:13 PM

## 2024-06-19 ENCOUNTER — Ambulatory Visit: Attending: Neurosurgery

## 2024-06-19 DIAGNOSIS — M5459 Other low back pain: Secondary | ICD-10-CM | POA: Insufficient documentation

## 2024-06-19 DIAGNOSIS — R2689 Other abnormalities of gait and mobility: Secondary | ICD-10-CM | POA: Diagnosis present

## 2024-06-19 DIAGNOSIS — M4316 Spondylolisthesis, lumbar region: Secondary | ICD-10-CM | POA: Diagnosis present

## 2024-06-19 DIAGNOSIS — M5416 Radiculopathy, lumbar region: Secondary | ICD-10-CM | POA: Diagnosis present
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
## 2024-06-20 ENCOUNTER — Encounter: Payer: Self-pay | Admitting: Physician Assistant

## 2024-06-20 ENCOUNTER — Ambulatory Visit (INDEPENDENT_AMBULATORY_CARE_PROVIDER_SITE_OTHER): Admitting: Physician Assistant

## 2024-06-20 VITALS — BP 154/82 | HR 63 | Resp 20 | Wt 250.0 lb

## 2024-06-20 DIAGNOSIS — R413 Other amnesia: Secondary | ICD-10-CM

## 2024-06-20 NOTE — Patient Instructions (Addendum)
 It was a pleasure to see you today at our office.   Recommendations:  Neurocognitive evaluation at our office   Follow up  May 13 at 11:30  Follow up in Nov 4 at 11:30      [email or web address]   RECOMMENDATIONS FOR ALL PATIENTS WITH MEMORY PROBLEMS: 1. Continue to exercise (Recommend 30 minutes of walking everyday, or 3 hours every week) 2. Increase social interactions - continue going to [place] and enjoy social gatherings with friends and family 3. Eat healthy, avoid fried foods and eat more fruits and vegetables 4. Maintain adequate blood pressure, blood sugar, and blood cholesterol level. Reducing the risk of stroke and cardiovascular disease also helps promoting better memory. 5. Avoid stressful situations. Live a simple life and avoid aggravations. Organize your time and prepare for the next day in anticipation. 6. Sleep well, avoid any interruptions of sleep and avoid any distractions in the bedroom that may interfere with adequate sleep quality 7. Avoid sugar, avoid sweets as there is a strong link between excessive sugar intake, diabetes, and cognitive impairment We discussed the Mediterranean diet, which has been shown to help patients reduce the risk of progressive memory disorders and reduces cardiovascular risk. This includes eating fish, eat fruits and green leafy vegetables, nuts like almonds and hazelnuts, walnuts, and also use olive oil. Avoid fast foods and fried foods as much as possible. Avoid sweets and sugar as sugar use has been linked to worsening of memory function.  There is always a concern of gradual progression of memory problems. If this is the case, then we may need to adjust level of care according to patient needs. Support, both to the patient and caregiver, should then be put into place.      You have been referred for a neuropsychological evaluation (i.e., evaluation of memory and thinking abilities).  Please bring someone with you to this appointment if possible, as it is helpful for the doctor to hear from both you and another adult who knows you well. Please bring eyeglasses and hearing aids if you wear them.    The evaluation will take approximately 3 hours and has two parts:   The first part is a clinical interview with the neuropsychologist (Dr. Richie or Dr. Jackquline). During the interview, the neuropsychologist will speak with you and the individual you brought to the appointment.    The second part of the evaluation is testing with the doctor's technician Neal or Luke). During the testing, the technician will ask you to remember different types of material, solve problems, and answer some questionnaires. Your family member will not be present for this portion of the evaluation.   Please note: We must reserve several hours of the neuropsychologist's time and the psychometrician's time for your evaluation appointment. As such, there is a No-Show fee of $100. If you are unable to attend any of your appointments, please contact our office as soon as possible to reschedule.      DRIVING: Regarding driving, in patients with progressive memory problems, driving will be impaired. We advise to have someone else do the driving if trouble finding directions or if minor accidents are reported. Independent driving assessment is available to determine safety of driving.   If you are interested in the driving assessment, you can contact the following:  [employer] in [place] [phone]  [facility] [phone]  [facility] [phone]  [facility] [phone] or [phone]   FALL PRECAUTIONS: Be cautious when walking. Scan the  area for obstacles that may increase the risk of trips and falls. When getting up in the mornings, sit up at the edge of the bed for a few minutes before getting out of bed. Consider elevating the bed at the head end  to avoid drop of blood pressure when getting up. Walk always in a well-lit room (use night lights in the walls). Avoid area rugs or power cords from appliances in the middle of the walkways. Use a walker or a cane if necessary and consider physical therapy for balance exercise. Get your eyesight checked regularly.  FINANCIAL OVERSIGHT: Supervision, especially oversight when making financial decisions or transactions is also recommended.  HOME SAFETY: Consider the safety of the kitchen when operating appliances like stoves, microwave oven, and blender. Consider having supervision and share cooking responsibilities until no longer able to participate in those. Accidents with firearms and other hazards in the house should be identified and addressed as well.   ABILITY TO BE LEFT ALONE: If patient is unable to contact 911 operator, consider using LifeLine, or when the need is there, arrange for someone to stay with patients. Smoking is a fire hazard, consider supervision or cessation. Risk of wandering should be assessed by caregiver and if detected at any point, supervision and safe proof recommendations should be instituted.  MEDICATION SUPERVISION: Inability to self-administer medication needs to be constantly addressed. Implement a mechanism to ensure safe administration of the medications.      Mediterranean Diet A Mediterranean diet refers to food and lifestyle choices that are based on the traditions of countries located on the [employer]. This way of eating has been shown to help prevent certain conditions and improve outcomes for people who have chronic diseases, like kidney disease and heart disease. What are tips for following this plan? Lifestyle  Cook and eat meals together with your family, when possible. Drink enough fluid to keep your urine clear or pale yellow. Be physically active every day. This includes: Aerobic exercise like running or swimming. Leisure activities like  gardening, walking, or housework. Get 7-8 hours of sleep each night. If recommended by your health care provider, drink red wine in moderation. This means 1 glass a day for nonpregnant women and 2 glasses a day for men. A glass of wine equals 5 oz (150 mL). Reading food labels  Check the serving size of packaged foods. For foods such as rice and pasta, the serving size refers to the amount of cooked product, not dry. Check the total fat in packaged foods. Avoid foods that have saturated fat or trans fats. Check the ingredients list for added sugars, such as corn syrup. Shopping  At the grocery store, buy most of your food from the areas near the walls of the store. This includes: Fresh fruits and vegetables (produce). Grains, beans, nuts, and seeds. Some of these may be available in unpackaged forms or large amounts (in bulk). Fresh seafood. Poultry and eggs. Low-fat dairy products. Buy whole ingredients instead of prepackaged foods. Buy fresh fruits and vegetables in-season from local farmers markets. Buy frozen fruits and vegetables in resealable bags. If you do not have access to quality fresh seafood, buy precooked frozen shrimp or canned fish, such as tuna, salmon, or sardines. Buy small amounts of raw or cooked vegetables, salads, or olives from the deli or salad bar at your store. Stock your pantry so you always have certain foods on hand, such as olive oil, canned tuna, canned tomatoes, rice, pasta, and beans.  Cooking  Cook foods with extra-virgin olive oil instead of using butter or other vegetable oils. Have meat as a side dish, and have vegetables or grains as your main dish. This means having meat in small portions or adding small amounts of meat to foods like pasta or stew. Use beans or vegetables instead of meat in common dishes like chili or lasagna. Experiment with different cooking methods. Try roasting or broiling vegetables instead of steaming or sauteing them. Add frozen  vegetables to soups, stews, pasta, or rice. Add nuts or seeds for added healthy fat at each meal. You can add these to yogurt, salads, or vegetable dishes. Marinate fish or vegetables using olive oil, lemon juice, garlic, and fresh herbs. Meal planning  Plan to eat 1 vegetarian meal one day each week. Try to work up to 2 vegetarian meals, if possible. Eat seafood 2 or more times a week. Have healthy snacks readily available, such as: Vegetable sticks with hummus. Greek yogurt. Fruit and nut trail mix. Eat balanced meals throughout the week. This includes: Fruit: 2-3 servings a day Vegetables: 4-5 servings a day Low-fat dairy: 2 servings a day Fish, poultry, or lean meat: 1 serving a day Beans and legumes: 2 or more servings a week Nuts and seeds: 1-2 servings a day Whole grains: 6-8 servings a day Extra-virgin olive oil: 3-4 servings a day Limit red meat and sweets to only a few servings a month What are my food choices? Mediterranean diet Recommended Grains: Whole-grain pasta. Brown rice. Bulgar wheat. Polenta. Couscous. Whole-wheat bread. [place] Made[place]. Vegetables: Artichokes. Beets. Broccoli. Cabbage. Carrots. Eggplant. Green beans. Chard. Kale. Spinach. Onions. Leeks. Peas. Squash. Tomatoes. Peppers. Radishes. Fruits: Apples. Apricots. Avocado. Berries. Bananas. Cherries. Dates. Figs. Grapes. Lemons. Melon. Oranges. Peaches. Plums. Pomegranate. Meats and other protein foods: Beans. Almonds. Sunflower seeds. Pine nuts. Peanuts. Cod. Salmon. Scallops. Shrimp. Tuna. Tilapia. Clams. Oysters. Eggs. Dairy: Low-fat milk. Cheese. Greek yogurt. Beverages: Water. Red wine. Herbal tea. Fats and oils: Extra virgin olive oil. Avocado oil. Grape seed oil. Sweets and desserts: Greek yogurt with honey. Baked apples. Poached pears. Trail mix. Seasoning and other foods: Basil. Cilantro. Coriander. Cumin. Mint. Parsley. Sage. Rosemary. Tarragon. Garlic. Oregano. Thyme. Pepper. Balsalmic vinegar.  Tahini. Hummus. Tomato sauce. Olives. Mushrooms. Limit these Grains: Prepackaged pasta or rice dishes. Prepackaged cereal with added sugar. Vegetables: Deep fried potatoes (french fries). Fruits: Fruit canned in syrup. Meats and other protein foods: Beef. Pork. Lamb. Poultry with skin. Hot dogs. [place]. Dairy: Ice cream. Sour cream. Whole milk. Beverages: Juice. Sugar-sweetened soft drinks. Beer. Liquor and spirits. Fats and oils: Butter. Canola oil. Vegetable oil. Beef fat (tallow). Lard. Sweets and desserts: Cookies. Cakes. Pies. Candy. Seasoning and other foods: Mayonnaise. Premade sauces and marinades. The items listed may not be a complete list. Talk with your dietitian about what dietary choices are right for you. Summary The Mediterranean diet includes both food and lifestyle choices. Eat a variety of fresh fruits and vegetables, beans, nuts, seeds, and whole grains. Limit the amount of red meat and sweets that you eat. Talk with your health care provider about whether it is safe for you to drink red wine in moderation. This means 1 glass a day for nonpregnant women and 2 glasses a day for men. A glass of wine equals 5 oz (150 mL). This information is not intended to replace advice given to you by your health care provider. Make sure you discuss any questions you have with your health care provider. Document Released:  03/26/2016 Document Revised: 04/28/2016 Document Reviewed: 03/26/2016 Elsevier Interactive Patient Education  2017 [employer].

## 2024-06-21 ENCOUNTER — Other Ambulatory Visit: Payer: Self-pay

## 2024-06-21 ENCOUNTER — Encounter: Payer: Self-pay | Admitting: Physical Therapy

## 2024-06-21 ENCOUNTER — Ambulatory Visit: Admitting: Physical Therapy

## 2024-06-21 DIAGNOSIS — M5459 Other low back pain: Secondary | ICD-10-CM

## 2024-06-21 DIAGNOSIS — M5416 Radiculopathy, lumbar region: Secondary | ICD-10-CM | POA: Diagnosis not present

## 2024-06-21 DIAGNOSIS — M4316 Spondylolisthesis, lumbar region: Secondary | ICD-10-CM

## 2024-06-21 DIAGNOSIS — R2689 Other abnormalities of gait and mobility: Secondary | ICD-10-CM
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
## 2024-06-22 ENCOUNTER — Other Ambulatory Visit: Payer: Self-pay | Admitting: Oncology

## 2024-06-23 ENCOUNTER — Other Ambulatory Visit: Payer: Self-pay | Admitting: Oncology

## 2024-06-23 ENCOUNTER — Encounter: Payer: Self-pay | Admitting: Psychology

## 2024-06-23 DIAGNOSIS — D499 Neoplasm of unspecified behavior of unspecified site: Secondary | ICD-10-CM | POA: Insufficient documentation

## 2024-06-23 DIAGNOSIS — M48061 Spinal stenosis, lumbar region without neurogenic claudication: Secondary | ICD-10-CM | POA: Insufficient documentation

## 2024-06-23 DIAGNOSIS — M47816 Spondylosis without myelopathy or radiculopathy, lumbar region: Secondary | ICD-10-CM | POA: Insufficient documentation

## 2024-06-23 DIAGNOSIS — J40 Bronchitis, not specified as acute or chronic: Secondary | ICD-10-CM | POA: Insufficient documentation

## 2024-06-26 ENCOUNTER — Encounter: Payer: Self-pay | Admitting: Psychology

## 2024-06-26 ENCOUNTER — Ambulatory Visit: Payer: Medicare Other | Admitting: Psychology

## 2024-06-26 ENCOUNTER — Encounter: Payer: Self-pay | Admitting: Oncology

## 2024-06-26 ENCOUNTER — Other Ambulatory Visit: Payer: Self-pay | Admitting: *Deleted

## 2024-06-26 ENCOUNTER — Ambulatory Visit: Payer: Self-pay

## 2024-06-26 DIAGNOSIS — R4189 Other symptoms and signs involving cognitive functions and awareness: Secondary | ICD-10-CM

## 2024-06-26 DIAGNOSIS — F0670 Mild neurocognitive disorder due to known physiological condition without behavioral disturbance: Secondary | ICD-10-CM | POA: Insufficient documentation

## 2024-06-26 MED ORDER — LENALIDOMIDE 5 MG PO CAPS: 5.0000 mg | ORAL_CAPSULE | Freq: Every day | ORAL | 0 refills | Status: DC

## 2024-06-26 NOTE — Progress Notes (Signed)
   Psychometrician Note   Cognitive testing was administered to Jeffery Higgins by Evalene Pizza, B.S. (psychometrist) under the supervision of Dr. Arthea KYM Maryland, Ph.D., ABPP, licensed psychologist on 06/26/2024. Jeffery Higgins did not appear overtly distressed by the testing session per behavioral observation or responses across self-report questionnaires. Rest breaks were offered.   The battery of tests administered was selected by Dr. Arthea KYM Maryland, Ph.D., ABPP with consideration to Jeffery Higgins current level of functioning, the nature of his symptoms, emotional and behavioral responses during interview, level of literacy, observed level of motivation/effort, and the nature of the referral question. This battery was communicated to the psychometrist. Communication between Dr. Arthea KYM Maryland, Ph.D., ABPP and the psychometrist was ongoing throughout the evaluation and Dr. Arthea KYM Maryland, Ph.D., ABPP was immediately accessible at all times. Dr. Zachary C. Merz, Ph.D., ABPP provided supervision to the psychometrist on the date of this service to the extent necessary to assure the quality of all services provided.    Jeffery Higgins will return within approximately 1-2 weeks for an interactive feedback session with Dr. Maryland at which time his test performances, clinical impressions, and treatment recommendations will be reviewed in detail. Jeffery Higgins understands he can contact our office should he require our assistance before this time.  A total of 192 minutes of billable time were spent face-to-face with Jeffery Higgins by the psychometrist. This includes both test administration and scoring time. Billing for these services is reflected in the clinical report generated by Dr. Arthea KYM Maryland, Ph.D., ABPP  This note reflects time spent with the psychometrician and does not include test scores or any clinical interpretations made by Dr. Maryland. The full report will follow in a separate note.

## 2024-06-26 NOTE — Progress Notes (Signed)
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 Fluency Test: Raw Score (Scaled Score) Percentile     Letter Total Correct 27 (7) 16 Below Average    Category Total Correct 37 (11) 63 Average    Category Switching Total Correct 11 (8) 25 Average    Category Switching Accuracy 8 (7) 16 Below Average      Total Set Loss Errors 5 (6) 9 Below Average      Total Repetition Errors 5 (8) 25 Average       Language:     Verbal Fluency Test: Raw Score (T Score) Percentile     Phonemic Fluency (FAS) 27 (39) 14 Below Average    Animal Fluency 19 (52) 58 Average        NAB Language Module, Form 1: T Score Percentile     Naming 29/31 (44) 27 Average       Visuospatial/Visuoconstruction:      Raw Score Percentile   Clock Drawing: 6/10 --- Impaired       NAB Spatial Module, Form 1: T Score Percentile     Figure Drawing Copy 62 88 Above Average        Scaled Score Percentile   WAIS-5 Block Design: 10 50 Average       Mood and Personality:      Raw Score Percentile   Beck Depression Inventory - II: 7 ---  Within Normal Limits  PROMIS Anxiety Questionnaire: 10 --- None to Slight       Additional Questionnaires:      Raw Score Percentile   PROMIS Sleep Disturbance  Questionnaire: 18 --- None to Slight   Informed Consent and Coding/Compliance:   The current evaluation represents a clinical evaluation for the purposes previously outlined by the referral source and is in no way reflective of a forensic evaluation.   [name] was provided with a verbal description of the nature and purpose of the present neuropsychological evaluation. Also reviewed were the foreseeable risks and/or discomforts and benefits of the procedure, limits of confidentiality, and mandatory reporting requirements of this provider. The patient was given the opportunity to ask questions and receive answers about the evaluation. Oral consent to participate was provided by the patient.   This evaluation was conducted by Arthea KYM Jeffery Higgins, Ph.D., ABPP-CN, board certified clinical neuropsychologist. [name] completed a clinical interview with Dr. Maryland, billed as one unit [phone], and 192 minutes of cognitive testing and scoring, billed as one unit [phone] and five additional units 96139. Psychometrist Jeffery Higgins, B.S. assisted Dr. Maryland with test administration and scoring procedures. As a separate and discrete service, one unit [phone] and two units 96133 (158 minutes) were billed for Dr. [name] time spent in interpretation and report writing.

## 2024-06-28 ENCOUNTER — Other Ambulatory Visit

## 2024-06-28 ENCOUNTER — Inpatient Hospital Stay

## 2024-06-28 ENCOUNTER — Encounter: Payer: Self-pay | Admitting: *Deleted

## 2024-06-28 ENCOUNTER — Inpatient Hospital Stay (HOSPITAL_BASED_OUTPATIENT_CLINIC_OR_DEPARTMENT_OTHER): Admitting: Oncology

## 2024-06-28 ENCOUNTER — Inpatient Hospital Stay: Attending: Nurse Practitioner

## 2024-06-28 VITALS — BP 138/73 | HR 69 | Temp 97.8°F | Resp 18 | Ht 70.0 in | Wt 251.5 lb

## 2024-06-28 VITALS — BP 137/76 | HR 57 | Temp 98.2°F | Resp 18

## 2024-06-28 DIAGNOSIS — G8929 Other chronic pain: Secondary | ICD-10-CM | POA: Insufficient documentation

## 2024-06-28 DIAGNOSIS — R21 Rash and other nonspecific skin eruption: Secondary | ICD-10-CM | POA: Diagnosis not present

## 2024-06-28 DIAGNOSIS — E1140 Type 2 diabetes mellitus with diabetic neuropathy, unspecified: Secondary | ICD-10-CM | POA: Diagnosis not present

## 2024-06-28 DIAGNOSIS — Z5112 Encounter for antineoplastic immunotherapy: Secondary | ICD-10-CM | POA: Diagnosis not present

## 2024-06-28 DIAGNOSIS — C9000 Multiple myeloma not having achieved remission: Secondary | ICD-10-CM | POA: Diagnosis present

## 2024-06-28 DIAGNOSIS — G4730 Sleep apnea, unspecified: Secondary | ICD-10-CM | POA: Diagnosis not present

## 2024-06-28 DIAGNOSIS — J45909 Unspecified asthma, uncomplicated: Secondary | ICD-10-CM | POA: Diagnosis not present

## 2024-06-28 DIAGNOSIS — M549 Dorsalgia, unspecified: Secondary | ICD-10-CM | POA: Insufficient documentation

## 2024-06-28 DIAGNOSIS — I110 Hypertensive heart disease with heart failure: Secondary | ICD-10-CM | POA: Diagnosis not present

## 2024-06-28 DIAGNOSIS — M25512 Pain in left shoulder: Secondary | ICD-10-CM | POA: Insufficient documentation

## 2024-06-28 DIAGNOSIS — Z87442 Personal history of urinary calculi: Secondary | ICD-10-CM | POA: Insufficient documentation

## 2024-06-28 LAB — CMP (CANCER CENTER ONLY)
ALT: 20 U/L (ref 0–44)
AST: 22 U/L (ref 15–41)
Albumin: 4.3 g/dL (ref 3.5–5.0)
Alkaline Phosphatase: 57 U/L (ref 38–126)
Anion gap: 9 (ref 5–15)
BUN: 17 mg/dL (ref 8–23)
CO2: 27 mmol/L (ref 22–32)
Calcium: 9.2 mg/dL (ref 8.9–10.3)
Chloride: 104 mmol/L (ref 98–111)
Creatinine: 0.98 mg/dL (ref 0.61–1.24)
GFR, Estimated: >60 mL/min (ref >60)
Glucose, Bld: 193 mg/dL — ABNORMAL HIGH (ref 70–99)
Potassium: 4.3 mmol/L (ref 3.5–5.1)
Sodium: 140 mmol/L (ref 135–145)
Total Bilirubin: 0.5 mg/dL (ref 0.0–1.2)
Total Protein: 6.5 g/dL (ref 6.5–8.1)

## 2024-06-28 LAB — CBC WITH DIFFERENTIAL (CANCER CENTER ONLY)
Abs Immature Granulocytes: 0.02 K/uL (ref 0.00–0.07)
Basophils Absolute: 0 K/uL (ref 0.0–0.1)
Basophils Relative: 1 %
Eosinophils Absolute: 0.3 K/uL (ref 0.0–0.5)
Eosinophils Relative: 3 %
HCT: 47.6 % (ref 39.0–52.0)
Hemoglobin: 15.6 g/dL (ref 13.0–17.0)
Immature Granulocytes: 0 %
Lymphocytes Relative: 23 %
Lymphs Abs: 1.8 K/uL (ref 0.7–4.0)
MCH: 30.7 pg (ref 26.0–34.0)
MCHC: 32.8 g/dL (ref 30.0–36.0)
MCV: 93.7 fL (ref 80.0–100.0)
Monocytes Absolute: 0.8 K/uL (ref 0.1–1.0)
Monocytes Relative: 10 %
Neutro Abs: 4.8 K/uL (ref 1.7–7.7)
Neutrophils Relative %: 63 %
Platelet Count: 148 K/uL — ABNORMAL LOW (ref 150–400)
RBC: 5.08 MIL/uL (ref 4.22–5.81)
RDW: 14.1 % (ref 11.5–15.5)
WBC Count: 7.7 K/uL (ref 4.0–10.5)
nRBC: 0 % (ref 0.0–0.2)

## 2024-06-28 MED ORDER — DIPHENHYDRAMINE HCL 25 MG PO CAPS
25.0000 mg | ORAL_CAPSULE | Freq: Once | ORAL | Status: AC
  Administered 2024-06-28: 25 mg via ORAL
  Filled 2024-06-28: qty 1

## 2024-06-28 MED ORDER — ACETAMINOPHEN 325 MG PO TABS
650.0000 mg | ORAL_TABLET | Freq: Once | ORAL | Status: AC
  Administered 2024-06-28: 650 mg via ORAL
  Filled 2024-06-28: qty 2

## 2024-06-28 MED ORDER — ZOLEDRONIC ACID 4 MG/100ML IV SOLN
4.0000 mg | Freq: Once | INTRAVENOUS | Status: AC
  Administered 2024-06-28: 4 mg via INTRAVENOUS
  Filled 2024-06-28: qty 100

## 2024-06-28 MED ORDER — DARATUMUMAB-HYALURONIDASE-FIHJ 1800-30000 MG-UT/15ML ~~LOC~~ SOLN
1800.0000 mg | Freq: Once | SUBCUTANEOUS | Status: AC
  Administered 2024-06-28: 1800 mg via SUBCUTANEOUS
  Filled 2024-06-28: qty 15

## 2024-06-28 NOTE — Progress Notes (Signed)
 [facility] OFFICE PROGRESS NOTE   Diagnosis: Multiple myeloma  INTERVAL HISTORY:   Jeffery Higgins returns as scheduled.  He continues treatment with daratumumab /lenalidomide .  He is scheduled to begin another treatment with lenalidomide  on 07/03/2024.  No rash since resuming lenalidomide .  He is taking Claritin.  He has chronic back pain.  No other complaint.  Objective:  Vital signs in last 24 hours:  Blood pressure 138/73, pulse 69, temperature 97.8 F (36.6 C), temperature source Temporal, resp. rate 18, height 5' 10 (1.778 m), weight 251 lb 8 oz (114.1 kg), SpO2 98%.    HEENT: No thrush Resp: Lungs clear anteriorly Cardio: Regular rate and rhythm GI: No hepatosplenomegaly Vascular: No leg edema   Lab Results:  Lab Results  Component Value Date   WBC 7.7 06/28/2024   HGB 15.6 06/28/2024   HCT 47.6 06/28/2024   MCV 93.7 06/28/2024   PLT 148 (L) 06/28/2024   NEUTROABS 4.8 06/28/2024    CMP  Lab Results  Component Value Date   NA 140 06/28/2024   K 4.3 06/28/2024   CL 104 06/28/2024   CO2 27 06/28/2024   GLUCOSE 193 (H) 06/28/2024   BUN 17 06/28/2024   CREATININE 0.98 06/28/2024   CALCIUM 9.2 06/28/2024   PROT 6.5 06/28/2024   ALBUMIN 4.3 06/28/2024   AST 22 06/28/2024   ALT 20 06/28/2024   ALKPHOS 57 06/28/2024   BILITOT 0.5 06/28/2024   GFRNONAA >60 06/28/2024   GFRAA >90 08/02/2012     Medications: I have reviewed the patient's current medications.   Assessment/Plan:  Multiple myeloma-IgG kappa 12/14/2022-SPEP-3.1 g M spike in the gamma region, 11/25/2022-x-ray left shoulder-lytic lesion in the medial aspect of the humeral head and neck with cortical breakthrough CT lumbar spine 12/10/2022-scattered lucent lesions throughout the lumbar spine 12/16/2022-CT cervical spine-expansile destructive lesion at C3 12/17/2022-MRI brain-right 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 numerous enhancing lesions of the skull  base and calvarium no evidence of extraosseous component 12/17/2022-MRI of the cervical, thoracic, and lumbar spine-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, clivus, bilateral occipital condyles, ribs, sacrum, and iliac bone, no evidence of pathologic fracture, expansile lesion in C3 extends into the spinal canal by 4 mm with moderate to severe spinal canal stenosis posterior to C3, mild right foraminal narrowing at T2-T3 and T3-4 related to osseous tumor at T3 12/17/2022-SPEP-2.6 g serum M spike, IgG kappa Bone marrow biopsy 12/18/2022-multiple myeloma with plasma cells involving 60% of the cellular marrow, kappa restricted myeloma FISH panel-QNS Bone survey 12/18/2022-lucencies at the skull, right scapula, left clavicle, lumbar spine, pelvis, and left femoral neck.  Destructive lesion at C3, possible sclerotic lucent lesions in the thoracic spine 12/18/2022 pelvic radiation to C3 and humeral head-12/31/2022 Cycle 1 daratumumab -VRD 01/01/2023 (Velcade  3 weeks on/1 week off) Cycle 2 daratumumab -VRD 01/29/2023, Revlimid  discontinued after 19 days due to a pruritic rash Cycle 3 daratumumab -VRD 02/26/2023, Revlimid  dose reduced to 10 mg Cycle 4 daratumumab -VRD 03/26/2023, Revlimid  10 mg x 18 days, Dex changed to prednisone  Monday Wednesday Friday due to rash; Revlimid  placed on hold 04/08/2023 due to rash at the lower abdominal wall and pruritus Cycle 5 daratumumab /VRD 04/23/2023-patient declines to resume Revlimid  due to rash/pruritus; Velcade  placed on hold 04/23/2023 due to progressive neuropathy; proceeded with daratumumab  and dexamethasone  04/28/2023 appointment with Dr. Russella [facility]-recommend continuing daratumumab  and dexamethasone .  Would attempt to resume lenalidomide  5 mg days 1 through 21 with addition of daily famotidine  and as needed topical steroids; escalate back  to 10 mg if tolerated; dexamethasone  20 mg p.o. weekly.  Cycle length 28 days. Cycle 6 Revlimid /dexamethasone  plus daratumumab  05/07/2023  (Revlimid  5 mg days 1 through 21, 28-day cycle; dexamethasone  10 mg weekly (unable to tolerate higher dose of dexamethasone  due to emotional lability); Pepcid  20 mg daily). Zometa  monthly during induction therapy then every 3 months x 2 years Cycle 7 Revlimid /Decadron  plus daratumumab  06/04/2023 (Revlimid  5 mg days 1-21, 28-day cycle, Decadron  4 mg weekly (Decadron  further reduced secondary to emotional lability) Cycle 8 Revlimid /Decadron  plus daratumumab  07/02/2023 Cycle 9 Revlimid /Decadron  plus daratumumab  07/30/2023 (he will discontinue home steroids) Myeloma panel at [facility] 07/28/2023, IgG kappa protein, M spike too low to quantify Cycle 10 Revlimid /Decadron  plus daratumumab  08/27/2023 Cycle 11 Revlimid /daratumumab -09/24/2023, Revlimid  starting 09/25/2023 Bone marrow biopsy at [facility] 09/29/2023-normocellular bone marrow with trilineage hematopoiesis and 1% blasts by manual aspirate differential.  No morphologic or overt immunophenotypic evidence of plasma cell neoplasm.  No monotypic plasma cells identified (MRD negative). Treatment held 10/22/2023 per patient request Appointment with Dr. Russella 11/03/2023-recommends continuing daratumumab  monthly and lenalidomide  3 weeks out of 4 indefinitely if effective.  Zometa  4 mg every 3 months for a total of 2 years; restaging myeloma labs every 3 months. Cycle 12 Revlimid /daratumumab  11/05/2023, Revlimid  starting 11/06/2023 Cycle 13 Revlimid /daratumumab  12/03/2023 12/31/2023 treatment placed on hold due to worsening neuropathy symptoms and new diagnosis of heart failure 02/08/2024 daratumumab  resumed; Revlimid  resumed ~02/22/2024;  Revlimid  placed on hold 03/21/2024 due to increased neuropathy symptoms Persistent neuropathy symptoms 04/03/2024, Revlimid  remains on hold, daratumumab  continued Appointment with Dr. Russella 05/09/2024-recommendations to continue daratumumab  per standard schedule, resume lenalidomide  5 mg 21 days on/7 days off, discontinue dexamethasone ; continue  Zometa  every 3 months for a total of 2 years.   2.  Left shoulder pain-likely secondary to a lytic lesion at the left humeral head/neck 3.  Left chin/distal mandible numbness-likely secondary to myeloma compressing nerve roots at the brainstem or left face 4.  Hypertension 5.  Diabetes 6.  Sleep apnea 7.  Nephrolithiasis-right ureteral calculus urinary tract infection, placement of a right ureter stent right stone dislodgment 10/01/2022 8.  Asthma 9.  Report of rash 04/08/2023-location and appearance 04/23/2023 consistent with skin change related to the Velcade  injection. 10.  Lower extremity edema-venous Doppler negative for DVT in bilateral lower extremity 10/22/2023. 11. Systolic heart failure-EF 35 to 40% on echocardiogram 12/10/2023; cardiac MRI 12/27/2023 with EF 35%. 12.  Back pain:  MRI cervical spine 12/15/2023-resolution of widespread marrow enhancing lesion, no definite residual tumor MRI lumbar spine 12/15/2023-resolution of previously seen T2 bright and enhancing marrow space lesions, 7 mm indeterminate focus in S2 13.  Bilateral breast enlargement-question gynecomastia      Disposition: Jeffery Higgins is in clinical remission from multiple myeloma.  The myeloma panel was negative on 05/31/2024.  He will complete another treatment daratumumab  today.  He will receive Zometa  today.  He denies tooth pain or loosening.  He will begin another cycle of Revlimid  next week.  He is tolerating the Revlimid  well.  Jeffery Higgins will return for an office visit and daratumumab  on 07/26/2024.  Jeffery Hof, MD  06/28/2024  9:14 AM

## 2024-06-28 NOTE — Progress Notes (Signed)
 Patient seen by Dr. Arley Hof today  Vitals are within treatment parameters:Yes   Labs are within treatment parameters: Yes   Treatment plan has been signed: Yes   Per physician team, Patient is ready for treatment and there are NO modifications to the treatment plan. Also due Zometa  today per Dr. Hof.

## 2024-06-28 NOTE — Patient Instructions (Signed)
 [facility] - A DEPT OF . [facility]  Discharge Instructions: Thank you for choosing [facility] to provide your oncology and hematology care.   If you have a lab appointment with the Cancer Center, please go directly to the Cancer Center and check in at the registration area.   Wear comfortable clothing and clothing appropriate for easy access to any Portacath or PICC line.   We strive to give you quality time with your provider. You may need to reschedule your appointment if you arrive late (15 or more minutes).  Arriving late affects you and other patients whose appointments are after yours.  Also, if you miss three or more appointments without notifying the office, you may be dismissed from the clinic at the provider's discretion.      For prescription refill requests, have your pharmacy contact our office and allow 72 hours for refills to be completed.    Today you received the following chemotherapy and/or immunotherapy agents: darzalex  faspro, zometa       To help prevent nausea and vomiting after your treatment, we encourage you to take your nausea medication as directed.  BELOW ARE SYMPTOMS THAT SHOULD BE REPORTED IMMEDIATELY: *FEVER GREATER THAN 100.4 F (38 C) OR HIGHER *CHILLS OR SWEATING *NAUSEA AND VOMITING THAT IS NOT CONTROLLED WITH YOUR NAUSEA MEDICATION *UNUSUAL SHORTNESS OF BREATH *UNUSUAL BRUISING OR BLEEDING *URINARY PROBLEMS (pain or burning when urinating, or frequent urination) *BOWEL PROBLEMS (unusual diarrhea, constipation, pain near the anus) TENDERNESS IN MOUTH AND THROAT WITH OR WITHOUT PRESENCE OF ULCERS (sore throat, sores in mouth, or a toothache) UNUSUAL RASH, SWELLING OR PAIN  UNUSUAL VAGINAL DISCHARGE OR ITCHING   Items with * indicate a potential emergency and should be followed up as soon as possible or go to the Emergency Department if any problems should occur.  Please show the CHEMOTHERAPY ALERT CARD or  IMMUNOTHERAPY ALERT CARD at check-in to the Emergency Department and triage nurse.  Should you have questions after your visit or need to cancel or reschedule your appointment, please contact [facility] - A DEPT OF [facility]  Dept: [phone]  and follow the prompts.  Office hours are 8:00 a.m. to 4:30 p.m. Monday - Friday. Please note that voicemails left after 4:00 p.m. may not be returned until the following business day.  We are closed weekends and major holidays. You have access to a nurse at all times for urgent questions. Please call the main number to the clinic Dept: [phone] and follow the prompts.   For any non-urgent questions, you may also contact your provider using MyChart. We now offer e-Visits for anyone 56 and older to request care online for non-urgent symptoms. For details visit [email or web address].   Also download the MyChart app! Go to the app store, search MyChart, open the app, select [place], and log in with your MyChart username and password.

## 2024-07-03 ENCOUNTER — Ambulatory Visit (INDEPENDENT_AMBULATORY_CARE_PROVIDER_SITE_OTHER): Payer: Medicare Other | Admitting: Psychology

## 2024-07-03 DIAGNOSIS — F0670 Mild neurocognitive disorder due to known physiological condition without behavioral disturbance: Secondary | ICD-10-CM

## 2024-07-03 NOTE — Progress Notes (Signed)
   Neuropsychology Feedback Session [facility] Department of Neurology  Reason for Referral:   Jeffery Higgins is a 68 y.o. right-handed Hispanic male referred by [name], PA-C, to characterize his current cognitive functioning and assist with diagnostic clarity and treatment planning in the context of ongoing pharmacological treatment for multiple myeloma (currently in remission) and subjective cognitive decline.   Feedback:   Jeffery Higgins completed a comprehensive neuropsychological evaluation on 06/26/2024. Please refer to that encounter for the full report and recommendations. Briefly, results suggested performance variability across processing speed and both delayed retrieval and recognition/consolidation aspects of memory. An isolated weakness was exhibited across a clock drawing task; however, all other visuospatial tasks were appropriate. The cause for mild cognitive dysfunction is likely multifactorial in nature. Chiefly among concerns, Jeffery Higgins has ongoing pharmacological treatment for multiple myeloma. Cognitive side effects, especially those surrounding processing speed and memory, are quite common in oral chemotherapy medications. Furthermore, he described a significant degree of lower back pain and takes oxycodone , which also has well established cognitive side effects. Finally, he also reported a history of currently untreated obstructive sleep apnea. The combination of these factors represents the most likely culprit for cognitive dysfunction exhibited across testing. Continued medical monitoring will be important moving forward.  Jeffery Higgins was unaccompanied during the current feedback session. Content of the current session focused on the results of his neuropsychological evaluation. Jeffery Higgins was given the opportunity to ask questions and his questions were answered. He was encouraged to reach out should additional questions arise. A copy of his  report was provided at the conclusion of the visit.      One unit 96132 (31 minutes) was billed for Dr. [name] time spent preparing for, conducting, and documenting the current feedback session with Jeffery Higgins.

## 2024-07-11 ENCOUNTER — Ambulatory Visit: Admitting: Physical Therapy

## 2024-07-11 DIAGNOSIS — M5459 Other low back pain: Secondary | ICD-10-CM

## 2024-07-11 DIAGNOSIS — R2689 Other abnormalities of gait and mobility: Secondary | ICD-10-CM

## 2024-07-11 DIAGNOSIS — M4316 Spondylolisthesis, lumbar region: Secondary | ICD-10-CM

## 2024-07-11 DIAGNOSIS — M5416 Radiculopathy, lumbar region: Secondary | ICD-10-CM
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
## 2024-07-13 ENCOUNTER — Other Ambulatory Visit: Payer: Self-pay | Admitting: Oncology

## 2024-07-17 ENCOUNTER — Ambulatory Visit: Admitting: Physical Therapy

## 2024-07-17 ENCOUNTER — Encounter: Payer: Self-pay | Admitting: Oncology

## 2024-07-20 ENCOUNTER — Ambulatory Visit: Admitting: Physical Therapy

## 2024-07-20 ENCOUNTER — Other Ambulatory Visit: Payer: Self-pay | Admitting: *Deleted

## 2024-07-20 MED ORDER — LENALIDOMIDE 5 MG PO CAPS: 5.0000 mg | ORAL_CAPSULE | Freq: Every day | ORAL | 0 refills | Status: DC

## 2024-07-20 NOTE — Telephone Encounter (Signed)
 Call from [facility] for refill on Revlimid .

## 2024-07-24 ENCOUNTER — Ambulatory Visit: Admitting: Physical Therapy

## 2024-07-26 ENCOUNTER — Encounter: Payer: Self-pay | Admitting: *Deleted

## 2024-07-26 ENCOUNTER — Inpatient Hospital Stay: Admitting: Oncology

## 2024-07-26 ENCOUNTER — Other Ambulatory Visit: Payer: Self-pay | Admitting: *Deleted

## 2024-07-26 ENCOUNTER — Inpatient Hospital Stay

## 2024-07-26 ENCOUNTER — Other Ambulatory Visit

## 2024-07-26 ENCOUNTER — Other Ambulatory Visit: Attending: Nurse Practitioner

## 2024-07-26 VITALS — BP 141/74 | HR 70 | Temp 97.8°F | Resp 18 | Ht 70.0 in | Wt 244.1 lb

## 2024-07-26 VITALS — BP 142/88 | HR 62 | Temp 98.4°F | Resp 16

## 2024-07-26 DIAGNOSIS — R21 Rash and other nonspecific skin eruption: Secondary | ICD-10-CM | POA: Insufficient documentation

## 2024-07-26 DIAGNOSIS — I110 Hypertensive heart disease with heart failure: Secondary | ICD-10-CM | POA: Insufficient documentation

## 2024-07-26 DIAGNOSIS — C9000 Multiple myeloma not having achieved remission: Secondary | ICD-10-CM | POA: Diagnosis not present

## 2024-07-26 DIAGNOSIS — N200 Calculus of kidney: Secondary | ICD-10-CM | POA: Insufficient documentation

## 2024-07-26 DIAGNOSIS — M25512 Pain in left shoulder: Secondary | ICD-10-CM | POA: Insufficient documentation

## 2024-07-26 DIAGNOSIS — Z5112 Encounter for antineoplastic immunotherapy: Secondary | ICD-10-CM | POA: Diagnosis not present

## 2024-07-26 DIAGNOSIS — S3210XA Unspecified fracture of sacrum, initial encounter for closed fracture: Secondary | ICD-10-CM | POA: Insufficient documentation

## 2024-07-26 DIAGNOSIS — Z87440 Personal history of urinary (tract) infections: Secondary | ICD-10-CM | POA: Diagnosis not present

## 2024-07-26 DIAGNOSIS — Z87442 Personal history of urinary calculi: Secondary | ICD-10-CM | POA: Diagnosis not present

## 2024-07-26 DIAGNOSIS — G8929 Other chronic pain: Secondary | ICD-10-CM | POA: Insufficient documentation

## 2024-07-26 DIAGNOSIS — J45909 Unspecified asthma, uncomplicated: Secondary | ICD-10-CM | POA: Insufficient documentation

## 2024-07-26 DIAGNOSIS — R251 Tremor, unspecified: Secondary | ICD-10-CM | POA: Insufficient documentation

## 2024-07-26 DIAGNOSIS — M549 Dorsalgia, unspecified: Secondary | ICD-10-CM | POA: Insufficient documentation

## 2024-07-26 DIAGNOSIS — E1140 Type 2 diabetes mellitus with diabetic neuropathy, unspecified: Secondary | ICD-10-CM | POA: Insufficient documentation

## 2024-07-26 DIAGNOSIS — G4730 Sleep apnea, unspecified: Secondary | ICD-10-CM | POA: Insufficient documentation

## 2024-07-26 LAB — CMP (CANCER CENTER ONLY)
ALT: 16 U/L (ref 0–44)
AST: 18 U/L (ref 15–41)
Albumin: 4.3 g/dL (ref 3.5–5.0)
Alkaline Phosphatase: 77 U/L (ref 38–126)
Anion gap: 12 (ref 5–15)
BUN: 20 mg/dL (ref 8–23)
CO2: 26 mmol/L (ref 22–32)
Calcium: 9.7 mg/dL (ref 8.9–10.3)
Chloride: 101 mmol/L (ref 98–111)
Creatinine: 1.08 mg/dL (ref 0.61–1.24)
GFR, Estimated: >60 mL/min (ref >60)
Glucose, Bld: 235 mg/dL — ABNORMAL HIGH (ref 70–99)
Potassium: 4 mmol/L (ref 3.5–5.1)
Sodium: 139 mmol/L (ref 135–145)
Total Bilirubin: 0.4 mg/dL (ref 0.0–1.2)
Total Protein: 6.8 g/dL (ref 6.5–8.1)

## 2024-07-26 LAB — CBC WITH DIFFERENTIAL (CANCER CENTER ONLY)
Abs Immature Granulocytes: 0.02 K/uL (ref 0.00–0.07)
Basophils Absolute: 0.1 K/uL (ref 0.0–0.1)
Basophils Relative: 1 %
Eosinophils Absolute: 0.5 K/uL (ref 0.0–0.5)
Eosinophils Relative: 6 %
HCT: 46.8 % (ref 39.0–52.0)
Hemoglobin: 15.6 g/dL (ref 13.0–17.0)
Immature Granulocytes: 0 %
Lymphocytes Relative: 21 %
Lymphs Abs: 1.7 K/uL (ref 0.7–4.0)
MCH: 30.2 pg (ref 26.0–34.0)
MCHC: 33.3 g/dL (ref 30.0–36.0)
MCV: 90.5 fL (ref 80.0–100.0)
Monocytes Absolute: 0.8 K/uL (ref 0.1–1.0)
Monocytes Relative: 10 %
Neutro Abs: 5.2 K/uL (ref 1.7–7.7)
Neutrophils Relative %: 62 %
Platelet Count: 174 K/uL (ref 150–400)
RBC: 5.17 MIL/uL (ref 4.22–5.81)
RDW: 13.6 % (ref 11.5–15.5)
WBC Count: 8.3 K/uL (ref 4.0–10.5)
nRBC: 0 % (ref 0.0–0.2)

## 2024-07-26 LAB — HEMOGLOBIN A1C
Hgb A1c MFr Bld: 5.9 % — ABNORMAL HIGH (ref 4.8–5.6)
Mean Plasma Glucose: 122.63 mg/dL

## 2024-07-26 MED ORDER — DARATUMUMAB-HYALURONIDASE-FIHJ 1800-30000 MG-UT/15ML ~~LOC~~ SOLN
1800.0000 mg | Freq: Once | SUBCUTANEOUS | Status: AC
  Administered 2024-07-26: 1800 mg via SUBCUTANEOUS
  Filled 2024-07-26: qty 15

## 2024-07-26 MED ORDER — DIPHENHYDRAMINE HCL 25 MG PO CAPS
25.0000 mg | ORAL_CAPSULE | Freq: Once | ORAL | Status: AC
  Administered 2024-07-26: 25 mg via ORAL
  Filled 2024-07-26: qty 1

## 2024-07-26 MED ORDER — ACETAMINOPHEN 325 MG PO TABS
650.0000 mg | ORAL_TABLET | Freq: Once | ORAL | Status: AC
  Administered 2024-07-26: 650 mg via ORAL
  Filled 2024-07-26: qty 2

## 2024-07-26 NOTE — Patient Instructions (Signed)
 [facility] - A DEPT OF [facility]  Discharge Instructions: Thank you for choosing [facility] to provide your oncology and hematology care.   If you have a lab appointment with the Cancer Center, please go directly to the Cancer Center and check in at the registration area.   Wear comfortable clothing and clothing appropriate for easy access to any Portacath or PICC line.   We strive to give you quality time with your provider. You may need to reschedule your appointment if you arrive late (15 or more minutes).  Arriving late affects you and other patients whose appointments are after yours.  Also, if you miss three or more appointments without notifying the office, you may be dismissed from the clinic at the provider's discretion.      For prescription refill requests, have your pharmacy contact our office and allow 72 hours for refills to be completed.    Today you received the following chemotherapy and/or immunotherapy agents: darzalex  faspro      To help prevent nausea and vomiting after your treatment, we encourage you to take your nausea medication as directed.  BELOW ARE SYMPTOMS THAT SHOULD BE REPORTED IMMEDIATELY: *FEVER GREATER THAN 100.4 F (38 C) OR HIGHER *CHILLS OR SWEATING *NAUSEA AND VOMITING THAT IS NOT CONTROLLED WITH YOUR NAUSEA MEDICATION *UNUSUAL SHORTNESS OF BREATH *UNUSUAL BRUISING OR BLEEDING *URINARY PROBLEMS (pain or burning when urinating, or frequent urination) *BOWEL PROBLEMS (unusual diarrhea, constipation, pain near the anus) TENDERNESS IN MOUTH AND THROAT WITH OR WITHOUT PRESENCE OF ULCERS (sore throat, sores in mouth, or a toothache) UNUSUAL RASH, SWELLING OR PAIN  UNUSUAL VAGINAL DISCHARGE OR ITCHING   Items with * indicate a potential emergency and should be followed up as soon as possible or go to the Emergency Department if any problems should occur.  Please show the CHEMOTHERAPY ALERT CARD or  IMMUNOTHERAPY ALERT CARD at check-in to the Emergency Department and triage nurse.  Should you have questions after your visit or need to cancel or reschedule your appointment, please contact [facility] - A DEPT OF [facility]  Dept: [phone]  and follow the prompts.  Office hours are 8:00 a.m. to 4:30 p.m. Monday - Friday. Please note that voicemails left after 4:00 p.m. may not be returned until the following business day.  We are closed weekends and major holidays. You have access to a nurse at all times for urgent questions. Please call the main number to the clinic Dept: [phone] and follow the prompts.   For any non-urgent questions, you may also contact your provider using MyChart. We now offer e-Visits for anyone 88 and older to request care online for non-urgent symptoms. For details visit [email or web address].   Also download the MyChart app! Go to the app store, search MyChart, open the app, select [place], and log in with your MyChart username and password.

## 2024-07-26 NOTE — Progress Notes (Signed)
 [facility] OFFICE PROGRESS NOTE   Diagnosis: Multiple myeloma  INTERVAL HISTORY:   Jeffery Higgins completed another treatment with daratumumab  on 06/28/2024.  No shortness of breath, cough, or rash.  He began another cycle of Revlimid  on 07/03/2024.  He reports stable neuropathy symptoms. He fell on a rock while working in his yard 07/18/2024.  He was seen in urgent care and diagnosed with a sacral fracture.  He reports intermittent tremors of the right foot for the past month.  No leg weakness.  No difficulty with bowel or bladder control.  He has chronic back pain and wears a brace.  Objective:  Vital signs in last 24 hours:  Blood pressure (!) 141/74, pulse 70, temperature 97.8 F (36.6 C), temperature source Temporal, resp. rate 18, height 5' 10 (1.778 m), weight 244 lb 1.6 oz (110.7 kg), SpO2 93%.    HEENT: No thrush  Resp: Coarse end inspiratory rhonchi at the left posterior base, no respiratory distress, exam limited by patient inability to take off his back brace Cardio: Regular rate and rhythm Vascular: No leg edema Neuro: The motor exam appears intact in the foot and legs bilaterally   Lab Results:  Lab Results  Component Value Date   WBC 8.3 07/26/2024   HGB 15.6 07/26/2024   HCT 46.8 07/26/2024   MCV 90.5 07/26/2024   PLT 174 07/26/2024   NEUTROABS 5.2 07/26/2024    CMP  Lab Results  Component Value Date   NA 140 06/28/2024   K 4.3 06/28/2024   CL 104 06/28/2024   CO2 27 06/28/2024   GLUCOSE 193 (H) 06/28/2024   BUN 17 06/28/2024   CREATININE 0.98 06/28/2024   CALCIUM 9.2 06/28/2024   PROT 6.5 06/28/2024   ALBUMIN 4.3 06/28/2024   AST 22 06/28/2024   ALT 20 06/28/2024   ALKPHOS 57 06/28/2024   BILITOT 0.5 06/28/2024   GFRNONAA >60 06/28/2024   GFRAA >90 08/02/2012     Medications: I have reviewed the patient's current medications.   Assessment/Plan: Multiple myeloma-IgG kappa 12/14/2022-SPEP-3.1 g M spike in the gamma  region, 11/25/2022-x-ray left shoulder-lytic lesion in the medial aspect of the humeral head and neck with cortical breakthrough CT lumbar spine 12/10/2022-scattered lucent lesions throughout the lumbar spine 12/16/2022-CT cervical spine-expansile destructive lesion at C3 12/17/2022-MRI brain-right 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 numerous enhancing lesions of the skull base and calvarium no evidence of extraosseous component 12/17/2022-MRI of the cervical, thoracic, and lumbar spine-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, clivus, bilateral occipital condyles, ribs, sacrum, and iliac bone, no evidence of pathologic fracture, expansile lesion in C3 extends into the spinal canal by 4 mm with moderate to severe spinal canal stenosis posterior to C3, mild right foraminal narrowing at T2-T3 and T3-4 related to osseous tumor at T3 12/17/2022-SPEP-2.6 g serum M spike, IgG kappa Bone marrow biopsy 12/18/2022-multiple myeloma with plasma cells involving 60% of the cellular marrow, kappa restricted myeloma FISH panel-QNS Bone survey 12/18/2022-lucencies at the skull, right scapula, left clavicle, lumbar spine, pelvis, and left femoral neck.  Destructive lesion at C3, possible sclerotic lucent lesions in the thoracic spine 12/18/2022 pelvic radiation to C3 and humeral head-12/31/2022 Cycle 1 daratumumab -VRD 01/01/2023 (Velcade  3 weeks on/1 week off) Cycle 2 daratumumab -VRD 01/29/2023, Revlimid  discontinued after 19 days due to a pruritic rash Cycle 3 daratumumab -VRD 02/26/2023, Revlimid  dose reduced to 10 mg Cycle 4 daratumumab -VRD 03/26/2023, Revlimid  10 mg x 18 days, Dex changed to prednisone  Monday Wednesday Friday due  to rash; Revlimid  placed on hold 04/08/2023 due to rash at the lower abdominal wall and pruritus Cycle 5 daratumumab /VRD 04/23/2023-patient declines to resume Revlimid  due to rash/pruritus; Velcade  placed on hold 04/23/2023 due to progressive neuropathy; proceeded with  daratumumab  and dexamethasone  04/28/2023 appointment with Dr. Russella [facility]-recommend continuing daratumumab  and dexamethasone .  Would attempt to resume lenalidomide  5 mg days 1 through 21 with addition of daily famotidine  and as needed topical steroids; escalate back to 10 mg if tolerated; dexamethasone  20 mg p.o. weekly.  Cycle length 28 days. Cycle 6 Revlimid /dexamethasone  plus daratumumab  05/07/2023 (Revlimid  5 mg days 1 through 21, 28-day cycle; dexamethasone  10 mg weekly (unable to tolerate higher dose of dexamethasone  due to emotional lability); Pepcid  20 mg daily). Zometa  monthly during induction therapy then every 3 months x 2 years Cycle 7 Revlimid /Decadron  plus daratumumab  06/04/2023 (Revlimid  5 mg days 1-21, 28-day cycle, Decadron  4 mg weekly (Decadron  further reduced secondary to emotional lability) Cycle 8 Revlimid /Decadron  plus daratumumab  07/02/2023 Cycle 9 Revlimid /Decadron  plus daratumumab  07/30/2023 (he will discontinue home steroids) Myeloma panel at [facility] 07/28/2023, IgG kappa protein, M spike too low to quantify Cycle 10 Revlimid /Decadron  plus daratumumab  08/27/2023 Cycle 11 Revlimid /daratumumab -09/24/2023, Revlimid  starting 09/25/2023 Bone marrow biopsy at [facility] 09/29/2023-normocellular bone marrow with trilineage hematopoiesis and 1% blasts by manual aspirate differential.  No morphologic or overt immunophenotypic evidence of plasma cell neoplasm.  No monotypic plasma cells identified (MRD negative). Treatment held 10/22/2023 per patient request Appointment with Dr. Russella 11/03/2023-recommends continuing daratumumab  monthly and lenalidomide  3 weeks out of 4 indefinitely if effective.  Zometa  4 mg every 3 months for a total of 2 years; restaging myeloma labs every 3 months. Cycle 12 Revlimid /daratumumab  11/05/2023, Revlimid  starting 11/06/2023 Cycle 13 Revlimid /daratumumab  12/03/2023 12/31/2023 treatment placed on hold due to worsening neuropathy symptoms and new diagnosis of heart  failure 02/08/2024 daratumumab  resumed; Revlimid  resumed ~02/22/2024;  Revlimid  placed on hold 03/21/2024 due to increased neuropathy symptoms Persistent neuropathy symptoms 04/03/2024, Revlimid  remains on hold, daratumumab  continued Appointment with Dr. Russella 05/09/2024-recommendations to continue daratumumab  per standard schedule, resume lenalidomide  5 mg 21 days on/7 days off, discontinue dexamethasone ; continue Zometa  every 3 months for a total of 2 years.   2.  Left shoulder pain-likely secondary to a lytic lesion at the left humeral head/neck 3.  Left chin/distal mandible numbness-likely secondary to myeloma compressing nerve roots at the brainstem or left face 4.  Hypertension 5.  Diabetes 6.  Sleep apnea 7.  Nephrolithiasis-right ureteral calculus urinary tract infection, placement of a right ureter stent right stone dislodgment 10/01/2022 8.  Asthma 9.  Report of rash 04/08/2023-location and appearance 04/23/2023 consistent with skin change related to the Velcade  injection. 10.  Lower extremity edema-venous Doppler negative for DVT in bilateral lower extremity 10/22/2023. 11. Systolic heart failure-EF 35 to 40% on echocardiogram 12/10/2023; cardiac MRI 12/27/2023 with EF 35%. 12.  Back pain:  MRI cervical spine 12/15/2023-resolution of widespread marrow enhancing lesion, no definite residual tumor MRI lumbar spine 12/15/2023-resolution of previously seen T2 bright and enhancing marrow space lesions, 7 mm indeterminate focus in S2 13.  Bilateral breast enlargement-question gynecomastia       Disposition: Jeffery Schlitt appears stable.  He is in clinical remission from multiple myeloma.  He will continue monthly Revlimid  and daratumumab .  He is scheduled to begin another cycle of Revlimid  on 07/31/2024.  He should see his primary provider or neurology if the right foot tremor persist.  I doubt this is related to the current treatment regimen or multiple myeloma.  He will return for an office visit  and daratumumab  in 4 weeks.  He requested we check the hemoglobin A1c today.  Jeffery Hof, MD  07/26/2024  9:00 AM

## 2024-07-26 NOTE — Progress Notes (Signed)
 Patient seen by Dr. Arley Hof today  Vitals are within treatment parameters:Yes   Labs are within treatment parameters: Yes   Treatment plan has been signed: Yes   Per physician team, Patient is ready for treatment and there are NO modifications to the treatment plan.

## 2024-07-27 ENCOUNTER — Ambulatory Visit: Admitting: Physical Therapy

## 2024-08-21 ENCOUNTER — Other Ambulatory Visit: Payer: Self-pay | Admitting: *Deleted

## 2024-08-21 MED ORDER — LENALIDOMIDE 5 MG PO CAPS: 5.0000 mg | ORAL_CAPSULE | Freq: Every day | ORAL | 0 refills | Status: AC

## 2024-08-23 ENCOUNTER — Encounter: Payer: Self-pay | Admitting: Nurse Practitioner

## 2024-08-23 ENCOUNTER — Inpatient Hospital Stay: Admitting: Nurse Practitioner

## 2024-08-23 ENCOUNTER — Inpatient Hospital Stay

## 2024-08-23 ENCOUNTER — Inpatient Hospital Stay: Attending: Nurse Practitioner

## 2024-08-23 VITALS — BP 137/62 | HR 67 | Temp 97.8°F | Resp 18 | Ht 70.0 in | Wt 248.0 lb

## 2024-08-23 DIAGNOSIS — G4730 Sleep apnea, unspecified: Secondary | ICD-10-CM | POA: Insufficient documentation

## 2024-08-23 DIAGNOSIS — R21 Rash and other nonspecific skin eruption: Secondary | ICD-10-CM | POA: Diagnosis not present

## 2024-08-23 DIAGNOSIS — E1140 Type 2 diabetes mellitus with diabetic neuropathy, unspecified: Secondary | ICD-10-CM | POA: Diagnosis not present

## 2024-08-23 DIAGNOSIS — M545 Low back pain, unspecified: Secondary | ICD-10-CM | POA: Diagnosis not present

## 2024-08-23 DIAGNOSIS — R251 Tremor, unspecified: Secondary | ICD-10-CM | POA: Diagnosis not present

## 2024-08-23 DIAGNOSIS — C9000 Multiple myeloma not having achieved remission: Secondary | ICD-10-CM

## 2024-08-23 DIAGNOSIS — Z87442 Personal history of urinary calculi: Secondary | ICD-10-CM | POA: Diagnosis not present

## 2024-08-23 DIAGNOSIS — I110 Hypertensive heart disease with heart failure: Secondary | ICD-10-CM | POA: Diagnosis not present

## 2024-08-23 DIAGNOSIS — L299 Pruritus, unspecified: Secondary | ICD-10-CM | POA: Diagnosis not present

## 2024-08-23 DIAGNOSIS — N202 Calculus of kidney with calculus of ureter: Secondary | ICD-10-CM | POA: Diagnosis not present

## 2024-08-23 DIAGNOSIS — Z87440 Personal history of urinary (tract) infections: Secondary | ICD-10-CM | POA: Diagnosis not present

## 2024-08-23 LAB — CMP (CANCER CENTER ONLY)
ALT: 18 U/L (ref 0–44)
AST: 20 U/L (ref 15–41)
Albumin: 4.1 g/dL (ref 3.5–5.0)
Alkaline Phosphatase: 58 U/L (ref 38–126)
Anion gap: 9 (ref 5–15)
BUN: 15 mg/dL (ref 8–23)
CO2: 25 mmol/L (ref 22–32)
Calcium: 9.3 mg/dL (ref 8.9–10.3)
Chloride: 108 mmol/L (ref 98–111)
Creatinine: 0.87 mg/dL (ref 0.61–1.24)
GFR, Estimated: >60 mL/min
Glucose, Bld: 152 mg/dL — ABNORMAL HIGH (ref 70–99)
Potassium: 3.9 mmol/L (ref 3.5–5.1)
Sodium: 142 mmol/L (ref 135–145)
Total Bilirubin: 0.6 mg/dL (ref 0.0–1.2)
Total Protein: 6.3 g/dL — ABNORMAL LOW (ref 6.5–8.1)

## 2024-08-23 LAB — CBC WITH DIFFERENTIAL (CANCER CENTER ONLY)
Abs Immature Granulocytes: 0.02 K/uL (ref 0.00–0.07)
Basophils Absolute: 0.1 K/uL (ref 0.0–0.1)
Basophils Relative: 1 %
Eosinophils Absolute: 0.4 K/uL (ref 0.0–0.5)
Eosinophils Relative: 7 %
HCT: 43.6 % (ref 39.0–52.0)
Hemoglobin: 14.5 g/dL (ref 13.0–17.0)
Immature Granulocytes: 0 %
Lymphocytes Relative: 22 %
Lymphs Abs: 1.4 K/uL (ref 0.7–4.0)
MCH: 30 pg (ref 26.0–34.0)
MCHC: 33.3 g/dL (ref 30.0–36.0)
MCV: 90.3 fL (ref 80.0–100.0)
Monocytes Absolute: 0.6 K/uL (ref 0.1–1.0)
Monocytes Relative: 9 %
Neutro Abs: 3.8 K/uL (ref 1.7–7.7)
Neutrophils Relative %: 61 %
Platelet Count: 156 K/uL (ref 150–400)
RBC: 4.83 MIL/uL (ref 4.22–5.81)
RDW: 14.7 % (ref 11.5–15.5)
WBC Count: 6.4 K/uL (ref 4.0–10.5)
nRBC: 0 % (ref 0.0–0.2)

## 2024-08-23 MED ORDER — DARATUMUMAB-HYALURONIDASE-FIHJ 1800-30000 MG-UT/15ML ~~LOC~~ SOLN
1800.0000 mg | Freq: Once | SUBCUTANEOUS | Status: AC
  Administered 2024-08-23: 1800 mg via SUBCUTANEOUS
  Filled 2024-08-23: qty 15

## 2024-08-23 MED ORDER — DIPHENHYDRAMINE HCL 25 MG PO CAPS
25.0000 mg | ORAL_CAPSULE | Freq: Once | ORAL | Status: AC
  Administered 2024-08-23: 25 mg via ORAL
  Filled 2024-08-23: qty 1

## 2024-08-23 MED ORDER — ACETAMINOPHEN 325 MG PO TABS
650.0000 mg | ORAL_TABLET | Freq: Once | ORAL | Status: AC
  Administered 2024-08-23: 650 mg via ORAL
  Filled 2024-08-23: qty 2

## 2024-08-23 NOTE — Progress Notes (Signed)
 Patient seen by Olam Ned NP today  Vitals are within treatment parameters:Yes   Labs are within treatment parameters: Yes   Treatment plan has been signed: Yes   Per physician team, Patient is ready for treatment and there are NO modifications to the treatment plan.

## 2024-08-23 NOTE — Patient Instructions (Signed)
 [facility] - A DEPT OF [facility]   Discharge Instructions: Thank you for choosing [facility] to provide your oncology and hematology care.   If you have a lab appointment with the Cancer Center, please go directly to the Cancer Center and check in at the registration area.   Wear comfortable clothing and clothing appropriate for easy access to any Portacath or PICC line.   We strive to give you quality time with your provider. You may need to reschedule your appointment if you arrive late (15 or more minutes).  Arriving late affects you and other patients whose appointments are after yours.  Also, if you miss three or more appointments without notifying the office, you may be dismissed from the clinic at the provider's discretion.      For prescription refill requests, have your pharmacy contact our office and allow 72 hours for refills to be completed.    Today you received the following chemotherapy and/or immunotherapy agents Daratumumab-hyaluronidase-fihj (DARZALEX FASPRO).      To help prevent nausea and vomiting after your treatment, we encourage you to take your nausea medication as directed.  BELOW ARE SYMPTOMS THAT SHOULD BE REPORTED IMMEDIATELY: *FEVER GREATER THAN 100.4 F (38 C) OR HIGHER *CHILLS OR SWEATING *NAUSEA AND VOMITING THAT IS NOT CONTROLLED WITH YOUR NAUSEA MEDICATION *UNUSUAL SHORTNESS OF BREATH *UNUSUAL BRUISING OR BLEEDING *URINARY PROBLEMS (pain or burning when urinating, or frequent urination) *BOWEL PROBLEMS (unusual diarrhea, constipation, pain near the anus) TENDERNESS IN MOUTH AND THROAT WITH OR WITHOUT PRESENCE OF ULCERS (sore throat, sores in mouth, or a toothache) UNUSUAL RASH, SWELLING OR PAIN  UNUSUAL VAGINAL DISCHARGE OR ITCHING   Items with * indicate a potential emergency and should be followed up as soon as possible or go to the Emergency Department if any problems should occur.  Please show the  CHEMOTHERAPY ALERT CARD or IMMUNOTHERAPY ALERT CARD at check-in to the Emergency Department and triage nurse.  Should you have questions after your visit or need to cancel or reschedule your appointment, please contact [facility] - A [facility] OF [facility]  Dept: [phone]  and follow the prompts.  Office hours are 8:00 a.m. to 4:30 p.m. Monday - Friday. Please note that voicemails left after 4:00 p.m. may not be returned until the following business day.  We are closed weekends and major holidays. You have access to a nurse at all times for urgent questions. Please call the main number to the clinic Dept: [phone] and follow the prompts.   For any non-urgent questions, you may also contact your provider using MyChart. We now offer e-Visits for anyone 52 and older to request care online for non-urgent symptoms. For details visit [email or web address].   Also download the MyChart app! Go to the app store, search "MyChart", open the app, select [facility], and log in with your MyChart username and password.  Daratumumab; Hyaluronidase Injection What is this medication? DARATUMUMAB; HYALURONIDASE (dar a toom ue mab; hye al ur ON i dase) treats multiple myeloma, a type of bone marrow cancer. Daratumumab works by blocking a protein that causes cancer cells to grow and multiply. This helps to slow or stop the spread of cancer cells. Hyaluronidase works by increasing the absorption of other medications in the body to help them work better. This medication may also be used treat amyloidosis, a condition that causes the buildup of a protein (amyloid) in your body. It  works by reducing the buildup of this protein, which decreases symptoms. It is a combination medication that contains a monoclonal antibody. This medicine may be used for other purposes; ask your health care provider or pharmacist if you have questions. COMMON BRAND NAME(S): DARZALEX FASPRO What should I tell  my care team before I take this medication? They need to know if you have any of these conditions: Heart disease Infection, such as chickenpox, cold sores, herpes, hepatitis B Lung or breathing disease An unusual or allergic reaction to daratumumab, hyaluronidase, other medications, foods, dyes, or preservatives Pregnant or trying to get pregnant Breast-feeding How should I use this medication? This medication is injected under the skin. It is given by your care team in a hospital or clinic setting. Talk to your care team about the use of this medication in children. Special care may be needed. Overdosage: If you think you have taken too much of this medicine contact a poison control center or emergency room at once. NOTE: This medicine is only for you. Do not share this medicine with others. What if I miss a dose? Keep appointments for follow-up doses. It is important not to miss your dose. Call your care team if you are unable to keep an appointment. What may interact with this medication? Interactions have not been studied. This list may not describe all possible interactions. Give your health care provider a list of all the medicines, herbs, non-prescription drugs, or dietary supplements you use. Also tell them if you smoke, drink alcohol, or use illegal drugs. Some items may interact with your medicine. What should I watch for while using this medication? Your condition will be monitored carefully while you are receiving this medication. This medication can cause serious allergic reactions. To reduce your risk, your care team may give you other medication to take before receiving this one. Be sure to follow the directions from your care team. This medication can affect the results of blood tests to match your blood type. These changes can last for up to 6 months after the final dose. Your care team will do blood tests to match your blood type before you start treatment. Tell all of your  care team that you are being treated with this medication before receiving a blood transfusion. This medication can affect the results of some tests used to determine treatment response; extra tests may be needed to evaluate response. Talk to your care team if you wish to become pregnant or think you are pregnant. This medication can cause serious birth defects if taken during pregnancy and for 3 months after the last dose. A reliable form of contraception is recommended while taking this medication and for 3 months after the last dose. Talk to your care team about effective forms of contraception. Do not breast-feed while taking this medication. What side effects may I notice from receiving this medication? Side effects that you should report to your care team as soon as possible: Allergic reactions--skin rash, itching, hives, swelling of the face, lips, tongue, or throat Heart rhythm changes--fast or irregular heartbeat, dizziness, feeling faint or lightheaded, chest pain, trouble breathing Infection--fever, chills, cough, sore throat, wounds that don't heal, pain or trouble when passing urine, general feeling of discomfort or being unwell Infusion reactions--chest pain, shortness of breath or trouble breathing, feeling faint or lightheaded Sudden eye pain or change in vision such as blurry vision, seeing halos around lights, vision loss Unusual bruising or bleeding Side effects that usually do not require  medical attention (report to your care team if they continue or are bothersome): Constipation Diarrhea Fatigue Nausea Pain, tingling, or numbness in the hands or feet Swelling of the ankles, hands, or feet This list may not describe all possible side effects. Call your doctor for medical advice about side effects. You may report side effects to FDA at 1-800-FDA-1088. Where should I keep my medication? This medication is given in a hospital or clinic. It will not be stored at home. NOTE:  This sheet is a summary. It may not cover all possible information. If you have questions about this medicine, talk to your doctor, pharmacist, or health care provider.  2024 Elsevier/Gold Standard (2021-12-09 00:00:00)

## 2024-08-23 NOTE — Progress Notes (Signed)
 " [facility] OFFICE PROGRESS NOTE   Diagnosis: Multiple myeloma  INTERVAL HISTORY:   Jeffery Higgins returns as scheduled.  He completed another treatment with daratumumab  07/26/2024.  He began another cycle of Revlimid  07/31/2024.  He denies nausea/vomiting.  No mouth sores.  No diarrhea.  No rash.  He denies any dental issues.  Stable chronic low back pain.  He continues to note right lower leg tremors.  Objective:  Vital signs in last 24 hours:  Blood pressure 137/62, pulse 67, temperature 97.8 F (36.6 C), temperature source Temporal, resp. rate 18, height 5' 10 (1.778 m), weight 248 lb (112.5 kg), SpO2 98%.   Overall the exam is limited by the presence of a back brace. HEENT: No thrush or ulcers. Resp: Lungs clear bilaterally. Cardio: Regular rate and rhythm. GI: No hepatosplenomegaly. Vascular: No leg edema. Neuro: Alert and oriented. Skin: No obvious rash.   Lab Results:  Lab Results  Component Value Date   WBC 6.4 08/23/2024   HGB 14.5 08/23/2024   HCT 43.6 08/23/2024   MCV 90.3 08/23/2024   PLT 156 08/23/2024   NEUTROABS 3.8 08/23/2024    Imaging:  No results found.  Medications: I have reviewed the patient's current medications.  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 numerous enhancing lesions of the skull base and calvarium no evidence of extraosseous component 12/17/2022-MRI of the cervical, thoracic, and lumbar spine-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, clivus, bilateral occipital condyles, ribs, sacrum, and iliac bone,  no evidence of pathologic fracture, expansile lesion in C3 extends into the spinal canal by 4 mm with moderate to severe spinal canal stenosis posterior to C3, mild right foraminal narrowing at T2-T3 and T3-4 related to osseous tumor at T3 12/17/2022-SPEP-2.6 g serum M spike, IgG kappa Bone marrow biopsy 12/18/2022-multiple myeloma with plasma cells involving 60% of the cellular marrow, kappa restricted myeloma FISH panel-QNS Bone survey 12/18/2022-lucencies at the skull, right scapula, left clavicle, lumbar spine, pelvis, and left femoral neck.  Destructive lesion at C3, possible sclerotic lucent lesions in the thoracic spine 12/18/2022 pelvic radiation to C3 and humeral head-12/31/2022 Cycle 1 daratumumab -VRD 01/01/2023 (Velcade  3 weeks on/1 week off) Cycle 2 daratumumab -VRD 01/29/2023, Revlimid  discontinued after 19 days due to a pruritic rash Cycle 3 daratumumab -VRD 02/26/2023, Revlimid  dose reduced to 10 mg Cycle 4 daratumumab -VRD 03/26/2023, Revlimid  10 mg x 18 days, Dex changed to prednisone  Monday Wednesday Friday due to rash; Revlimid  placed on hold 04/08/2023 due to rash at the lower abdominal wall and pruritus Cycle 5 daratumumab /VRD 04/23/2023-patient declines to resume Revlimid  due to rash/pruritus; Velcade  placed on hold 04/23/2023 due to progressive neuropathy; proceeded with daratumumab  and dexamethasone  04/28/2023 appointment with Dr. Russella [facility]-recommend continuing daratumumab  and dexamethasone .  Would attempt to resume lenalidomide  5 mg days 1 through 21 with addition of daily famotidine  and as needed topical steroids; escalate back to 10 mg if tolerated; dexamethasone  20 mg p.o. weekly.  Cycle length 28 days. Cycle 6 Revlimid /dexamethasone  plus daratumumab  05/07/2023 (Revlimid  5 mg days 1 through 21, 28-day cycle; dexamethasone  10 mg weekly (unable to tolerate higher dose of dexamethasone  due to emotional lability); Pepcid  20 mg daily). Zometa  monthly during induction therapy then every 3 months  x 2  years Cycle 7 Revlimid /Decadron  plus daratumumab  06/04/2023 (Revlimid  5 mg days 1-21, 28-day cycle, Decadron  4 mg weekly (Decadron  further reduced secondary to emotional lability) Cycle 8 Revlimid /Decadron  plus daratumumab  07/02/2023 Cycle 9 Revlimid /Decadron  plus daratumumab  07/30/2023 (he will discontinue home steroids) Myeloma panel at [facility] 07/28/2023, IgG kappa protein, M spike too low to quantify Cycle 10 Revlimid /Decadron  plus daratumumab  08/27/2023 Cycle 11 Revlimid /daratumumab -09/24/2023, Revlimid  starting 09/25/2023 Bone marrow biopsy at [facility] 09/29/2023-normocellular bone marrow with trilineage hematopoiesis and 1% blasts by manual aspirate differential.  No morphologic or overt immunophenotypic evidence of plasma cell neoplasm.  No monotypic plasma cells identified (MRD negative). Treatment held 10/22/2023 per patient request Appointment with Dr. Russella 11/03/2023-recommends continuing daratumumab  monthly and lenalidomide  3 weeks out of 4 indefinitely if effective.  Zometa  4 mg every 3 months for a total of 2 years; restaging myeloma labs every 3 months. Cycle 12 Revlimid /daratumumab  11/05/2023, Revlimid  starting 11/06/2023 Cycle 13 Revlimid /daratumumab  12/03/2023 12/31/2023 treatment placed on hold due to worsening neuropathy symptoms and new diagnosis of heart failure 02/08/2024 daratumumab  resumed; Revlimid  resumed ~02/22/2024;  Revlimid  placed on hold 03/21/2024 due to increased neuropathy symptoms Persistent neuropathy symptoms 04/03/2024, Revlimid  remains on hold, daratumumab  continued Appointment with Dr. Russella 05/09/2024-recommendations to continue daratumumab  per standard schedule, resume lenalidomide  5 mg 21 days on/7 days off, discontinue dexamethasone ; continue Zometa  every 3 months for a total of 2 years.   2.  Left shoulder pain-likely secondary to a lytic lesion at the left humeral head/neck 3.  Left chin/distal mandible numbness-likely secondary to myeloma compressing nerve roots at the  brainstem or left face 4.  Hypertension 5.  Diabetes 6.  Sleep apnea 7.  Nephrolithiasis-right ureteral calculus urinary tract infection, placement of a right ureter stent right stone dislodgment 10/01/2022 8.  Asthma 9.  Report of rash 04/08/2023-location and appearance 04/23/2023 consistent with skin change related to the Velcade  injection. 10.  Lower extremity edema-venous Doppler negative for DVT in bilateral lower extremity 10/22/2023. 11. Systolic heart failure-EF 35 to 40% on echocardiogram 12/10/2023; cardiac MRI 12/27/2023 with EF 35%. 12.  Back pain:  MRI cervical spine 12/15/2023-resolution of widespread marrow enhancing lesion, no definite residual tumor MRI lumbar spine 12/15/2023-resolution of previously seen T2 bright and enhancing marrow space lesions, 7 mm indeterminate focus in S2 13.  Bilateral breast enlargement-question gynecomastia      Disposition: Jeffery Higgins appears stable.  He remains in clinical remission from multiple myeloma.  He continues Revlimid  21 days on/7 days off and monthly daratumumab .  He seems to be tolerating treatment well.  He continues Zometa  every 3 months, next due in 4 weeks.  CBC and chemistry panel reviewed.  Labs adequate to proceed as above.  He will follow-up with his PCP or neurology for evaluation of the right lower leg tremor.  He will return for an office visit and daratumumab  in 4 weeks.  We are available to see him sooner if needed.    Jeffery Higgins ANP/GNP-BC   08/23/2024  11:21 AM         "

## 2024-08-24 LAB — KAPPA/LAMBDA LIGHT CHAINS
Kappa free light chain: 14.6 mg/L (ref 3.3–19.4)
Kappa, lambda light chain ratio: 1.27 (ref 0.26–1.65)
Lambda free light chains: 11.5 mg/L (ref 5.7–26.3)

## 2024-08-26 LAB — MULTIPLE MYELOMA PANEL, SERUM
Albumin SerPl Elph-Mcnc: 3.6 g/dL (ref 2.9–4.4)
Albumin/Glob SerPl: 1.9 — ABNORMAL HIGH (ref 0.7–1.7)
Alpha 1: 0.2 g/dL (ref 0.0–0.4)
Alpha2 Glob SerPl Elph-Mcnc: 0.6 g/dL (ref 0.4–1.0)
B-Globulin SerPl Elph-Mcnc: 0.6 g/dL — ABNORMAL LOW (ref 0.7–1.3)
Gamma Glob SerPl Elph-Mcnc: 0.7 g/dL (ref 0.4–1.8)
Globulin, Total: 2 g/dL — ABNORMAL LOW (ref 2.2–3.9)
IgA: 59 mg/dL — ABNORMAL LOW (ref 61–437)
IgG (Immunoglobin G), Serum: 757 mg/dL (ref 603–1613)
IgM (Immunoglobulin M), Srm: 33 mg/dL (ref 20–172)
Total Protein ELP: 5.6 g/dL — ABNORMAL LOW (ref 6.0–8.5)

## 2024-09-19 ENCOUNTER — Inpatient Hospital Stay: Attending: Nurse Practitioner

## 2024-09-19 ENCOUNTER — Inpatient Hospital Stay

## 2024-09-19 ENCOUNTER — Inpatient Hospital Stay: Admitting: Oncology

## 2024-09-19 VITALS — BP 134/69 | HR 66 | Temp 97.9°F | Resp 18

## 2024-09-19 VITALS — BP 129/73 | HR 64 | Temp 97.8°F | Resp 13 | Wt 246.0 lb

## 2024-09-19 DIAGNOSIS — C9000 Multiple myeloma not having achieved remission: Secondary | ICD-10-CM

## 2024-09-19 LAB — CBC WITH DIFFERENTIAL (CANCER CENTER ONLY)
Abs Immature Granulocytes: 0.01 10*3/uL (ref 0.00–0.07)
Basophils Absolute: 0.1 10*3/uL (ref 0.0–0.1)
Basophils Relative: 1 %
Eosinophils Absolute: 0.4 10*3/uL (ref 0.0–0.5)
Eosinophils Relative: 6 %
HCT: 46.3 % (ref 39.0–52.0)
Hemoglobin: 15 g/dL (ref 13.0–17.0)
Immature Granulocytes: 0 %
Lymphocytes Relative: 22 %
Lymphs Abs: 1.2 10*3/uL (ref 0.7–4.0)
MCH: 29.2 pg (ref 26.0–34.0)
MCHC: 32.4 g/dL (ref 30.0–36.0)
MCV: 90.3 fL (ref 80.0–100.0)
Monocytes Absolute: 0.7 10*3/uL (ref 0.1–1.0)
Monocytes Relative: 12 %
Neutro Abs: 3.4 10*3/uL (ref 1.7–7.7)
Neutrophils Relative %: 59 %
Platelet Count: 151 10*3/uL (ref 150–400)
RBC: 5.13 MIL/uL (ref 4.22–5.81)
RDW: 14.7 % (ref 11.5–15.5)
WBC Count: 5.7 10*3/uL (ref 4.0–10.5)
nRBC: 0 % (ref 0.0–0.2)

## 2024-09-19 LAB — CMP (CANCER CENTER ONLY)
ALT: 15 U/L (ref 0–44)
AST: 17 U/L (ref 15–41)
Albumin: 4.4 g/dL (ref 3.5–5.0)
Alkaline Phosphatase: 73 U/L (ref 38–126)
Anion gap: 9 (ref 5–15)
BUN: 15 mg/dL (ref 8–23)
CO2: 29 mmol/L (ref 22–32)
Calcium: 9.7 mg/dL (ref 8.9–10.3)
Chloride: 105 mmol/L (ref 98–111)
Creatinine: 0.96 mg/dL (ref 0.61–1.24)
GFR, Estimated: >60 mL/min
Glucose, Bld: 100 mg/dL — ABNORMAL HIGH (ref 70–99)
Potassium: 3.8 mmol/L (ref 3.5–5.1)
Sodium: 142 mmol/L (ref 135–145)
Total Bilirubin: 0.4 mg/dL (ref 0.0–1.2)
Total Protein: 6.9 g/dL (ref 6.5–8.1)

## 2024-09-19 MED ORDER — ZOLEDRONIC ACID 4 MG/100ML IV SOLN
4.0000 mg | Freq: Once | INTRAVENOUS | Status: AC
  Administered 2024-09-19: 4 mg via INTRAVENOUS
  Filled 2024-09-19: qty 100

## 2024-09-19 MED ORDER — DIPHENHYDRAMINE HCL 25 MG PO CAPS
25.0000 mg | ORAL_CAPSULE | Freq: Once | ORAL | Status: AC
  Administered 2024-09-19: 25 mg via ORAL
  Filled 2024-09-19: qty 1

## 2024-09-19 MED ORDER — ACETAMINOPHEN 325 MG PO TABS
650.0000 mg | ORAL_TABLET | Freq: Once | ORAL | Status: AC
  Administered 2024-09-19: 650 mg via ORAL
  Filled 2024-09-19: qty 2

## 2024-09-19 MED ORDER — DARATUMUMAB-HYALURONIDASE-FIHJ 1800-30000 MG-UT/15ML ~~LOC~~ SOLN
1800.0000 mg | Freq: Once | SUBCUTANEOUS | Status: AC
  Administered 2024-09-19: 1800 mg via SUBCUTANEOUS
  Filled 2024-09-19: qty 15

## 2024-09-19 NOTE — Patient Instructions (Signed)
 [facility] - A DEPT OF [place]  Discharge Instructions: Thank you for choosing [facility] to provide your oncology and hematology care.   If you have a lab appointment with the Cancer Center, please go directly to the Cancer Center and check in at the registration area.   Wear comfortable clothing and clothing appropriate for easy access to any Portacath or PICC line.   We strive to give you quality time with your provider. You may need to reschedule your appointment if you arrive late (15 or more minutes).  Arriving late affects you and other patients whose appointments are after yours.  Also, if you miss three or more appointments without notifying the office, you may be dismissed from the clinic at the provider's discretion.      For prescription refill requests, have your pharmacy contact our office and allow 72 hours for refills to be completed.    Today you received the following chemotherapy and/or immunotherapy agents Darzalex  Faspro      To help prevent nausea and vomiting after your treatment, we encourage you to take your nausea medication as directed.  BELOW ARE SYMPTOMS THAT SHOULD BE REPORTED IMMEDIATELY: *FEVER GREATER THAN 100.4 F (38 C) OR HIGHER *CHILLS OR SWEATING *NAUSEA AND VOMITING THAT IS NOT CONTROLLED WITH YOUR NAUSEA MEDICATION *UNUSUAL SHORTNESS OF BREATH *UNUSUAL BRUISING OR BLEEDING *URINARY PROBLEMS (pain or burning when urinating, or frequent urination) *BOWEL PROBLEMS (unusual diarrhea, constipation, pain near the anus) TENDERNESS IN MOUTH AND THROAT WITH OR WITHOUT PRESENCE OF ULCERS (sore throat, sores in mouth, or a toothache) UNUSUAL RASH, SWELLING OR PAIN  UNUSUAL VAGINAL DISCHARGE OR ITCHING   Items with * indicate a potential emergency and should be followed up as soon as possible or go to the Emergency Department if any problems should occur.  Please show the CHEMOTHERAPY ALERT CARD or  IMMUNOTHERAPY ALERT CARD at check-in to the Emergency Department and triage nurse.  Should you have questions after your visit or need to cancel or reschedule your appointment, please contact [facility] - A DEPT OF [facility]  Dept: [phone]  and follow the prompts.  Office hours are 8:00 a.m. to 4:30 p.m. Monday - Friday. Please note that voicemails left after 4:00 p.m. may not be returned until the following business day.  We are closed weekends and major holidays. You have access to a nurse at all times for urgent questions. Please call the main number to the clinic Dept: [phone] and follow the prompts.   For any non-urgent questions, you may also contact your provider using MyChart. We now offer e-Visits for anyone 50 and older to request care online for non-urgent symptoms. For details visit [email or web address].   Also download the MyChart app! Go to the app store, search "MyChart", open the app, select [place], and log in with your MyChart username and password.  Zoledronic  Acid Injection (Cancer) What is this medication? ZOLEDRONIC  ACID (ZOE le dron ik AS id) treats high calcium levels in the blood caused by cancer. It may also be used with chemotherapy to treat weakened bones caused by cancer. It works by slowing down the release of calcium from bones. This lowers calcium levels in your blood. It also makes your bones stronger and less likely to break (fracture). It belongs to a group of medications called bisphosphonates. This medicine may be used for other purposes; ask your health care provider or pharmacist if  you have questions. COMMON BRAND NAME(S): Zometa , Zometa  Powder What should I tell my care team before I take this medication? They need to know if you have any of these conditions: Dehydration Dental disease Kidney disease Liver disease Low levels of calcium in the blood Lung or breathing disease, such as asthma Receiving  steroids, such as dexamethasone  or prednisone An unusual or allergic reaction to zoledronic  acid, other medications, foods, dyes, or preservatives Pregnant or trying to get pregnant Breast-feeding How should I use this medication? This medication is injected into a vein. It is given by your care team in a hospital or clinic setting. Talk to your care team about the use of this medication in children. Special care may be needed. Overdosage: If you think you have taken too much of this medicine contact a poison control center or emergency room at once. NOTE: This medicine is only for you. Do not share this medicine with others. What if I miss a dose? Keep appointments for follow-up doses. It is important not to miss your dose. Call your care team if you are unable to keep an appointment. What may interact with this medication? Certain antibiotics given by injection Diuretics, such as bumetanide, furosemide NSAIDs, medications for pain and inflammation, such as ibuprofen  or naproxen Teriparatide Thalidomide This list may not describe all possible interactions. Give your health care provider a list of all the medicines, herbs, non-prescription drugs, or dietary supplements you use. Also tell them if you smoke, drink alcohol, or use illegal drugs. Some items may interact with your medicine. What should I watch for while using this medication? Visit your care team for regular checks on your progress. It may be some time before you see the benefit from this medication. Some people who take this medication have severe bone, joint, or muscle pain. This medication may also increase your risk for jaw problems or a broken thigh bone. Tell your care team right away if you have severe pain in your jaw, bones, joints, or muscles. Tell you care team if you have any pain that does not go away or that gets worse. Tell your dentist and dental surgeon that you are taking this medication. You should not have major  dental surgery while on this medication. See your dentist to have a dental exam and fix any dental problems before starting this medication. Take good care of your teeth while on this medication. Make sure you see your dentist for regular follow-up appointments. You should make sure you get enough calcium and vitamin D  while you are taking this medication. Discuss the foods you eat and the vitamins you take with your care team. Check with your care team if you have severe diarrhea, nausea, and vomiting, or if you sweat a lot. The loss of too much body fluid may make it dangerous for you to take this medication. You may need bloodwork while taking this medication. Talk to your care team if you wish to become pregnant or think you might be pregnant. This medication can cause serious birth defects. What side effects may I notice from receiving this medication? Side effects that you should report to your care team as soon as possible: Allergic reactions--skin rash, itching, hives, swelling of the face, lips, tongue, or throat Kidney injury--decrease in the amount of urine, swelling of the ankles, hands, or feet Low calcium level--muscle pain or cramps, confusion, tingling, or numbness in the hands or feet Osteonecrosis of the jaw--pain, swelling, or redness in the mouth,  numbness of the jaw, poor healing after dental work, unusual discharge from the mouth, visible bones in the mouth Severe bone, joint, or muscle pain Side effects that usually do not require medical attention (report to your care team if they continue or are bothersome): Constipation Fatigue Fever Loss of appetite Nausea Stomach pain This list may not describe all possible side effects. Call your doctor for medical advice about side effects. You may report side effects to FDA at 1-800-FDA-1088. Where should I keep my medication? This medication is given in a hospital or clinic. It will not be stored at home. NOTE: This sheet is a  summary. It may not cover all possible information. If you have questions about this medicine, talk to your doctor, pharmacist, or health care provider.  2024 [employer]/Gold Standard (2021-09-26 00:00:00)

## 2024-09-20 ENCOUNTER — Inpatient Hospital Stay: Admitting: Nurse Practitioner

## 2024-09-20 ENCOUNTER — Inpatient Hospital Stay

## 2024-09-22 ENCOUNTER — Telehealth: Payer: Self-pay | Admitting: *Deleted

## 2024-09-22 NOTE — Telephone Encounter (Signed)
 LVM and sent Mychart message asking when did he take his last Revlimid  for current cycle. Need to reorder next cycle.

## 2024-10-18 ENCOUNTER — Inpatient Hospital Stay

## 2024-10-18 ENCOUNTER — Inpatient Hospital Stay: Admitting: Nurse Practitioner

## 2024-12-27 ENCOUNTER — Ambulatory Visit: Admitting: Physician Assistant

## 2025-06-20 ENCOUNTER — Ambulatory Visit: Payer: Self-pay | Admitting: Physician Assistant
